# Patient Record
Sex: Female | Born: 1945 | Race: White | Hispanic: No | Marital: Single | Smoking: Never smoker
Health system: Southern US, Community
[De-identification: ages and names within clinical notes are randomized; demographics above are authoritative.]

## PROBLEM LIST (undated history)

## (undated) DIAGNOSIS — K5900 Constipation, unspecified: Secondary | ICD-10-CM

## (undated) DIAGNOSIS — M549 Dorsalgia, unspecified: Secondary | ICD-10-CM

## (undated) DIAGNOSIS — R600 Localized edema: Secondary | ICD-10-CM

## (undated) DIAGNOSIS — K829 Disease of gallbladder, unspecified: Secondary | ICD-10-CM

## (undated) DIAGNOSIS — J45909 Unspecified asthma, uncomplicated: Secondary | ICD-10-CM

## (undated) DIAGNOSIS — G4730 Sleep apnea, unspecified: Secondary | ICD-10-CM

## (undated) DIAGNOSIS — R7303 Prediabetes: Secondary | ICD-10-CM

## (undated) DIAGNOSIS — J449 Chronic obstructive pulmonary disease, unspecified: Secondary | ICD-10-CM

## (undated) DIAGNOSIS — R0600 Dyspnea, unspecified: Secondary | ICD-10-CM

## (undated) DIAGNOSIS — I10 Essential (primary) hypertension: Secondary | ICD-10-CM

## (undated) DIAGNOSIS — F419 Anxiety disorder, unspecified: Secondary | ICD-10-CM

## (undated) DIAGNOSIS — M2550 Pain in unspecified joint: Secondary | ICD-10-CM

## (undated) DIAGNOSIS — F32A Depression, unspecified: Secondary | ICD-10-CM

## (undated) DIAGNOSIS — F329 Major depressive disorder, single episode, unspecified: Secondary | ICD-10-CM

## (undated) DIAGNOSIS — E039 Hypothyroidism, unspecified: Secondary | ICD-10-CM

## (undated) DIAGNOSIS — E785 Hyperlipidemia, unspecified: Secondary | ICD-10-CM

## (undated) HISTORY — DX: Essential (primary) hypertension: I10

## (undated) HISTORY — PX: CHOLECYSTECTOMY: SHX55

## (undated) HISTORY — DX: Anxiety disorder, unspecified: F41.9

## (undated) HISTORY — DX: Hyperlipidemia, unspecified: E78.5

## (undated) HISTORY — DX: Localized edema: R60.0

## (undated) HISTORY — DX: Disease of gallbladder, unspecified: K82.9

## (undated) HISTORY — DX: Chronic obstructive pulmonary disease, unspecified: J44.9

## (undated) HISTORY — DX: Depression, unspecified: F32.A

## (undated) HISTORY — DX: Dorsalgia, unspecified: M54.9

## (undated) HISTORY — DX: Major depressive disorder, single episode, unspecified: F32.9

## (undated) HISTORY — DX: Dyspnea, unspecified: R06.00

## (undated) HISTORY — PX: LAPAROSCOPIC GASTRIC BANDING: SHX1100

## (undated) HISTORY — DX: Constipation, unspecified: K59.00

## (undated) HISTORY — DX: Hypothyroidism, unspecified: E03.9

## (undated) HISTORY — DX: Prediabetes: R73.03

## (undated) HISTORY — PX: ABDOMINAL HYSTERECTOMY: SHX81

## (undated) HISTORY — DX: Unspecified asthma, uncomplicated: J45.909

## (undated) HISTORY — PX: OTHER SURGICAL HISTORY: SHX169

## (undated) HISTORY — DX: Pain in unspecified joint: M25.50

---

## 2013-03-07 ENCOUNTER — Ambulatory Visit: Payer: Self-pay | Admitting: Adult Health

## 2014-12-31 DIAGNOSIS — R0602 Shortness of breath: Secondary | ICD-10-CM | POA: Diagnosis not present

## 2015-01-31 DIAGNOSIS — R0602 Shortness of breath: Secondary | ICD-10-CM | POA: Diagnosis not present

## 2015-02-26 DIAGNOSIS — I10 Essential (primary) hypertension: Secondary | ICD-10-CM | POA: Diagnosis not present

## 2015-02-26 DIAGNOSIS — E780 Pure hypercholesterolemia: Secondary | ICD-10-CM | POA: Diagnosis not present

## 2015-02-26 DIAGNOSIS — E039 Hypothyroidism, unspecified: Secondary | ICD-10-CM | POA: Diagnosis not present

## 2015-02-26 DIAGNOSIS — R7309 Other abnormal glucose: Secondary | ICD-10-CM | POA: Diagnosis not present

## 2015-03-01 DIAGNOSIS — R0602 Shortness of breath: Secondary | ICD-10-CM | POA: Diagnosis not present

## 2015-03-16 DIAGNOSIS — H5211 Myopia, right eye: Secondary | ICD-10-CM | POA: Diagnosis not present

## 2015-03-16 DIAGNOSIS — H5210 Myopia, unspecified eye: Secondary | ICD-10-CM | POA: Diagnosis not present

## 2015-03-16 DIAGNOSIS — H40003 Preglaucoma, unspecified, bilateral: Secondary | ICD-10-CM | POA: Diagnosis not present

## 2015-03-23 DIAGNOSIS — J4541 Moderate persistent asthma with (acute) exacerbation: Secondary | ICD-10-CM | POA: Diagnosis not present

## 2015-03-23 DIAGNOSIS — G4734 Idiopathic sleep related nonobstructive alveolar hypoventilation: Secondary | ICD-10-CM | POA: Diagnosis not present

## 2015-03-23 DIAGNOSIS — R0609 Other forms of dyspnea: Secondary | ICD-10-CM | POA: Diagnosis not present

## 2015-04-01 DIAGNOSIS — R0602 Shortness of breath: Secondary | ICD-10-CM | POA: Diagnosis not present

## 2015-05-01 DIAGNOSIS — R0602 Shortness of breath: Secondary | ICD-10-CM | POA: Diagnosis not present

## 2015-06-01 DIAGNOSIS — R0602 Shortness of breath: Secondary | ICD-10-CM | POA: Diagnosis not present

## 2015-07-01 DIAGNOSIS — R0602 Shortness of breath: Secondary | ICD-10-CM | POA: Diagnosis not present

## 2015-07-02 ENCOUNTER — Other Ambulatory Visit: Payer: Self-pay | Admitting: Family Medicine

## 2015-07-02 DIAGNOSIS — I10 Essential (primary) hypertension: Secondary | ICD-10-CM | POA: Diagnosis not present

## 2015-07-02 DIAGNOSIS — R7309 Other abnormal glucose: Secondary | ICD-10-CM | POA: Diagnosis not present

## 2015-07-02 DIAGNOSIS — E039 Hypothyroidism, unspecified: Secondary | ICD-10-CM | POA: Diagnosis not present

## 2015-07-02 DIAGNOSIS — Z0000 Encounter for general adult medical examination without abnormal findings: Secondary | ICD-10-CM | POA: Diagnosis not present

## 2015-07-02 DIAGNOSIS — E780 Pure hypercholesterolemia: Secondary | ICD-10-CM | POA: Diagnosis not present

## 2015-07-02 DIAGNOSIS — Z1231 Encounter for screening mammogram for malignant neoplasm of breast: Secondary | ICD-10-CM

## 2015-07-06 ENCOUNTER — Other Ambulatory Visit: Payer: Self-pay | Admitting: Family Medicine

## 2015-07-06 ENCOUNTER — Ambulatory Visit
Admission: RE | Admit: 2015-07-06 | Discharge: 2015-07-06 | Disposition: A | Discharge status: Home / Self Care | Payer: Commercial Managed Care - HMO | Source: Ambulatory Visit | Attending: Family Medicine | Admitting: Family Medicine

## 2015-07-06 DIAGNOSIS — Z1231 Encounter for screening mammogram for malignant neoplasm of breast: Secondary | ICD-10-CM | POA: Insufficient documentation

## 2015-07-08 DIAGNOSIS — H40003 Preglaucoma, unspecified, bilateral: Secondary | ICD-10-CM | POA: Diagnosis not present

## 2015-08-01 DIAGNOSIS — R0602 Shortness of breath: Secondary | ICD-10-CM | POA: Diagnosis not present

## 2015-08-27 DIAGNOSIS — B372 Candidiasis of skin and nail: Secondary | ICD-10-CM | POA: Diagnosis not present

## 2015-08-27 DIAGNOSIS — Z8709 Personal history of other diseases of the respiratory system: Secondary | ICD-10-CM | POA: Diagnosis not present

## 2015-08-27 DIAGNOSIS — B9689 Other specified bacterial agents as the cause of diseases classified elsewhere: Secondary | ICD-10-CM | POA: Diagnosis not present

## 2015-08-27 DIAGNOSIS — J208 Acute bronchitis due to other specified organisms: Secondary | ICD-10-CM | POA: Diagnosis not present

## 2015-09-01 DIAGNOSIS — R0602 Shortness of breath: Secondary | ICD-10-CM | POA: Diagnosis not present

## 2015-09-24 DIAGNOSIS — G4734 Idiopathic sleep related nonobstructive alveolar hypoventilation: Secondary | ICD-10-CM | POA: Diagnosis not present

## 2015-09-24 DIAGNOSIS — R0609 Other forms of dyspnea: Secondary | ICD-10-CM | POA: Diagnosis not present

## 2015-09-24 DIAGNOSIS — J310 Chronic rhinitis: Secondary | ICD-10-CM | POA: Diagnosis not present

## 2015-09-24 DIAGNOSIS — J449 Chronic obstructive pulmonary disease, unspecified: Secondary | ICD-10-CM | POA: Diagnosis not present

## 2015-10-01 DIAGNOSIS — R0602 Shortness of breath: Secondary | ICD-10-CM | POA: Diagnosis not present

## 2015-10-13 DIAGNOSIS — J441 Chronic obstructive pulmonary disease with (acute) exacerbation: Secondary | ICD-10-CM | POA: Diagnosis not present

## 2015-11-01 DIAGNOSIS — R0602 Shortness of breath: Secondary | ICD-10-CM | POA: Diagnosis not present

## 2015-11-03 DIAGNOSIS — E7800 Pure hypercholesterolemia, unspecified: Secondary | ICD-10-CM | POA: Diagnosis not present

## 2015-11-03 DIAGNOSIS — F331 Major depressive disorder, recurrent, moderate: Secondary | ICD-10-CM | POA: Diagnosis not present

## 2015-11-03 DIAGNOSIS — E039 Hypothyroidism, unspecified: Secondary | ICD-10-CM | POA: Diagnosis not present

## 2015-11-03 DIAGNOSIS — I10 Essential (primary) hypertension: Secondary | ICD-10-CM | POA: Diagnosis not present

## 2015-11-03 DIAGNOSIS — R7303 Prediabetes: Secondary | ICD-10-CM | POA: Diagnosis not present

## 2015-12-01 DIAGNOSIS — R0602 Shortness of breath: Secondary | ICD-10-CM | POA: Diagnosis not present

## 2016-01-01 DIAGNOSIS — R0602 Shortness of breath: Secondary | ICD-10-CM | POA: Diagnosis not present

## 2016-02-01 DIAGNOSIS — R0602 Shortness of breath: Secondary | ICD-10-CM | POA: Diagnosis not present

## 2016-02-29 DIAGNOSIS — R0602 Shortness of breath: Secondary | ICD-10-CM | POA: Diagnosis not present

## 2016-03-01 DIAGNOSIS — E039 Hypothyroidism, unspecified: Secondary | ICD-10-CM | POA: Diagnosis not present

## 2016-03-01 DIAGNOSIS — I10 Essential (primary) hypertension: Secondary | ICD-10-CM | POA: Diagnosis not present

## 2016-03-01 DIAGNOSIS — E7800 Pure hypercholesterolemia, unspecified: Secondary | ICD-10-CM | POA: Diagnosis not present

## 2016-03-01 DIAGNOSIS — R7303 Prediabetes: Secondary | ICD-10-CM | POA: Diagnosis not present

## 2016-03-01 DIAGNOSIS — R7309 Other abnormal glucose: Secondary | ICD-10-CM | POA: Diagnosis not present

## 2016-03-10 DIAGNOSIS — E876 Hypokalemia: Secondary | ICD-10-CM | POA: Diagnosis not present

## 2016-03-10 DIAGNOSIS — N289 Disorder of kidney and ureter, unspecified: Secondary | ICD-10-CM | POA: Diagnosis not present

## 2016-03-10 DIAGNOSIS — R0609 Other forms of dyspnea: Secondary | ICD-10-CM | POA: Diagnosis not present

## 2016-03-12 DIAGNOSIS — J45909 Unspecified asthma, uncomplicated: Secondary | ICD-10-CM | POA: Diagnosis not present

## 2016-03-17 DIAGNOSIS — J310 Chronic rhinitis: Secondary | ICD-10-CM | POA: Diagnosis not present

## 2016-03-17 DIAGNOSIS — J45909 Unspecified asthma, uncomplicated: Secondary | ICD-10-CM | POA: Diagnosis not present

## 2016-03-17 DIAGNOSIS — J9801 Acute bronchospasm: Secondary | ICD-10-CM | POA: Diagnosis not present

## 2016-03-28 DIAGNOSIS — J45909 Unspecified asthma, uncomplicated: Secondary | ICD-10-CM | POA: Diagnosis not present

## 2016-03-28 DIAGNOSIS — R05 Cough: Secondary | ICD-10-CM | POA: Diagnosis not present

## 2016-03-31 DIAGNOSIS — R0602 Shortness of breath: Secondary | ICD-10-CM | POA: Diagnosis not present

## 2016-04-04 DIAGNOSIS — I10 Essential (primary) hypertension: Secondary | ICD-10-CM | POA: Diagnosis not present

## 2016-04-27 DIAGNOSIS — E039 Hypothyroidism, unspecified: Secondary | ICD-10-CM | POA: Diagnosis not present

## 2016-04-30 DIAGNOSIS — R0602 Shortness of breath: Secondary | ICD-10-CM | POA: Diagnosis not present

## 2016-05-31 DIAGNOSIS — R0602 Shortness of breath: Secondary | ICD-10-CM | POA: Diagnosis not present

## 2016-06-30 DIAGNOSIS — R0602 Shortness of breath: Secondary | ICD-10-CM | POA: Diagnosis not present

## 2016-07-31 DIAGNOSIS — R0602 Shortness of breath: Secondary | ICD-10-CM | POA: Diagnosis not present

## 2016-08-24 DIAGNOSIS — E7800 Pure hypercholesterolemia, unspecified: Secondary | ICD-10-CM | POA: Diagnosis not present

## 2016-08-24 DIAGNOSIS — R001 Bradycardia, unspecified: Secondary | ICD-10-CM | POA: Diagnosis not present

## 2016-08-24 DIAGNOSIS — I10 Essential (primary) hypertension: Secondary | ICD-10-CM | POA: Diagnosis not present

## 2016-08-24 DIAGNOSIS — R7303 Prediabetes: Secondary | ICD-10-CM | POA: Diagnosis not present

## 2016-08-24 DIAGNOSIS — F3340 Major depressive disorder, recurrent, in remission, unspecified: Secondary | ICD-10-CM | POA: Diagnosis not present

## 2016-08-24 DIAGNOSIS — E039 Hypothyroidism, unspecified: Secondary | ICD-10-CM | POA: Diagnosis not present

## 2016-08-31 DIAGNOSIS — R0602 Shortness of breath: Secondary | ICD-10-CM | POA: Diagnosis not present

## 2016-09-01 DIAGNOSIS — N179 Acute kidney failure, unspecified: Secondary | ICD-10-CM | POA: Diagnosis not present

## 2016-09-01 DIAGNOSIS — E876 Hypokalemia: Secondary | ICD-10-CM | POA: Diagnosis not present

## 2016-09-12 DIAGNOSIS — Z79899 Other long term (current) drug therapy: Secondary | ICD-10-CM | POA: Diagnosis not present

## 2016-09-30 DIAGNOSIS — R0602 Shortness of breath: Secondary | ICD-10-CM | POA: Diagnosis not present

## 2016-10-20 DIAGNOSIS — Z23 Encounter for immunization: Secondary | ICD-10-CM | POA: Diagnosis not present

## 2016-10-31 DIAGNOSIS — R0602 Shortness of breath: Secondary | ICD-10-CM | POA: Diagnosis not present

## 2016-11-30 DIAGNOSIS — R0602 Shortness of breath: Secondary | ICD-10-CM | POA: Diagnosis not present

## 2016-12-31 DIAGNOSIS — R0602 Shortness of breath: Secondary | ICD-10-CM | POA: Diagnosis not present

## 2017-01-31 DIAGNOSIS — R0602 Shortness of breath: Secondary | ICD-10-CM | POA: Diagnosis not present

## 2017-02-02 DIAGNOSIS — F3340 Major depressive disorder, recurrent, in remission, unspecified: Secondary | ICD-10-CM | POA: Diagnosis not present

## 2017-02-02 DIAGNOSIS — E1129 Type 2 diabetes mellitus with other diabetic kidney complication: Secondary | ICD-10-CM | POA: Diagnosis not present

## 2017-02-02 DIAGNOSIS — Z780 Asymptomatic menopausal state: Secondary | ICD-10-CM | POA: Diagnosis not present

## 2017-02-02 DIAGNOSIS — E039 Hypothyroidism, unspecified: Secondary | ICD-10-CM | POA: Diagnosis not present

## 2017-02-02 DIAGNOSIS — Z23 Encounter for immunization: Secondary | ICD-10-CM | POA: Diagnosis not present

## 2017-02-02 DIAGNOSIS — E7800 Pure hypercholesterolemia, unspecified: Secondary | ICD-10-CM | POA: Diagnosis not present

## 2017-02-02 DIAGNOSIS — I10 Essential (primary) hypertension: Secondary | ICD-10-CM | POA: Diagnosis not present

## 2017-02-02 DIAGNOSIS — Z0000 Encounter for general adult medical examination without abnormal findings: Secondary | ICD-10-CM | POA: Diagnosis not present

## 2017-02-03 ENCOUNTER — Other Ambulatory Visit: Payer: Self-pay | Admitting: Family Medicine

## 2017-02-03 DIAGNOSIS — Z1231 Encounter for screening mammogram for malignant neoplasm of breast: Secondary | ICD-10-CM

## 2017-02-06 ENCOUNTER — Ambulatory Visit
Admission: RE | Admit: 2017-02-06 | Discharge: 2017-02-06 | Disposition: A | Discharge status: Home / Self Care | Payer: Commercial Managed Care - HMO | Source: Ambulatory Visit | Attending: Family Medicine | Admitting: Family Medicine

## 2017-02-06 DIAGNOSIS — Z1231 Encounter for screening mammogram for malignant neoplasm of breast: Secondary | ICD-10-CM | POA: Diagnosis not present

## 2017-02-15 DIAGNOSIS — H524 Presbyopia: Secondary | ICD-10-CM | POA: Diagnosis not present

## 2017-02-20 DIAGNOSIS — M8588 Other specified disorders of bone density and structure, other site: Secondary | ICD-10-CM | POA: Diagnosis not present

## 2017-02-20 DIAGNOSIS — Z780 Asymptomatic menopausal state: Secondary | ICD-10-CM | POA: Diagnosis not present

## 2017-02-21 DIAGNOSIS — R938 Abnormal findings on diagnostic imaging of other specified body structures: Secondary | ICD-10-CM | POA: Diagnosis not present

## 2017-02-22 DIAGNOSIS — M47816 Spondylosis without myelopathy or radiculopathy, lumbar region: Secondary | ICD-10-CM | POA: Diagnosis not present

## 2017-02-28 DIAGNOSIS — R0602 Shortness of breath: Secondary | ICD-10-CM | POA: Diagnosis not present

## 2017-03-31 DIAGNOSIS — R0602 Shortness of breath: Secondary | ICD-10-CM | POA: Diagnosis not present

## 2017-03-31 DIAGNOSIS — E039 Hypothyroidism, unspecified: Secondary | ICD-10-CM | POA: Diagnosis not present

## 2017-04-27 ENCOUNTER — Encounter (INDEPENDENT_AMBULATORY_CARE_PROVIDER_SITE_OTHER): Payer: Self-pay | Admitting: Family Medicine

## 2017-04-30 DIAGNOSIS — R0602 Shortness of breath: Secondary | ICD-10-CM | POA: Diagnosis not present

## 2017-05-03 DIAGNOSIS — Z0289 Encounter for other administrative examinations: Secondary | ICD-10-CM

## 2017-05-30 DIAGNOSIS — E1129 Type 2 diabetes mellitus with other diabetic kidney complication: Secondary | ICD-10-CM | POA: Diagnosis not present

## 2017-05-30 DIAGNOSIS — F419 Anxiety disorder, unspecified: Secondary | ICD-10-CM | POA: Diagnosis not present

## 2017-05-30 DIAGNOSIS — E039 Hypothyroidism, unspecified: Secondary | ICD-10-CM | POA: Diagnosis not present

## 2017-05-30 DIAGNOSIS — I10 Essential (primary) hypertension: Secondary | ICD-10-CM | POA: Diagnosis not present

## 2017-05-30 DIAGNOSIS — E7800 Pure hypercholesterolemia, unspecified: Secondary | ICD-10-CM | POA: Diagnosis not present

## 2017-05-30 DIAGNOSIS — F3340 Major depressive disorder, recurrent, in remission, unspecified: Secondary | ICD-10-CM | POA: Diagnosis not present

## 2017-05-31 ENCOUNTER — Encounter (INDEPENDENT_AMBULATORY_CARE_PROVIDER_SITE_OTHER): Payer: Self-pay | Admitting: Family Medicine

## 2017-05-31 ENCOUNTER — Ambulatory Visit (INDEPENDENT_AMBULATORY_CARE_PROVIDER_SITE_OTHER): Payer: Commercial Managed Care - HMO | Admitting: Family Medicine

## 2017-05-31 VITALS — BP 148/76 | HR 119 | Temp 98.3°F | Ht 59.0 in | Wt 217.0 lb

## 2017-05-31 DIAGNOSIS — R0602 Shortness of breath: Secondary | ICD-10-CM

## 2017-05-31 DIAGNOSIS — R7303 Prediabetes: Secondary | ICD-10-CM | POA: Diagnosis not present

## 2017-05-31 DIAGNOSIS — Z1389 Encounter for screening for other disorder: Secondary | ICD-10-CM

## 2017-05-31 DIAGNOSIS — R5383 Other fatigue: Secondary | ICD-10-CM | POA: Diagnosis not present

## 2017-05-31 DIAGNOSIS — E559 Vitamin D deficiency, unspecified: Secondary | ICD-10-CM

## 2017-05-31 DIAGNOSIS — E038 Other specified hypothyroidism: Secondary | ICD-10-CM

## 2017-05-31 DIAGNOSIS — Z6841 Body Mass Index (BMI) 40.0 and over, adult: Secondary | ICD-10-CM | POA: Diagnosis not present

## 2017-05-31 DIAGNOSIS — I10 Essential (primary) hypertension: Secondary | ICD-10-CM | POA: Diagnosis not present

## 2017-05-31 DIAGNOSIS — E7849 Other hyperlipidemia: Secondary | ICD-10-CM

## 2017-05-31 DIAGNOSIS — Z1331 Encounter for screening for depression: Secondary | ICD-10-CM

## 2017-05-31 DIAGNOSIS — E784 Other hyperlipidemia: Secondary | ICD-10-CM

## 2017-05-31 NOTE — Progress Notes (Signed)
Office: [phone]  /  Fax: [phone]   HPI:   Chief Complaint: OBESITY  Jenny Thomas (MR# [id number]) is a 71 y.o. female who presents on 05/31/2017 for obesity evaluation and treatment. Current BMI is Body mass index is 43.83 kg/m.Jenny Thomas has struggled with obesity for years and has been unsuccessful in either losing weight or maintaining long term weight loss. Jenny Thomas attended our information session and states she is currently in the action stage of change and ready to dedicate time achieving and maintaining a healthier weight.  Jenny Thomas states her family eats meals together she thinks her family will eat healthier with  her her desired weight loss is 46 lbs she started gaining weight after giving birth her heaviest weight ever was 240 lbs. she has significant food cravings issues  she snacks frequently in the evenings she wakes up frquently in the middle of the night to eat she skips meals frequently she is frequently drinking liquids with calories she frequently makes poor food choices she frequently eats larger portions than normal  she has binge eating behaviors she struggles with emotional eating    Fatigue Frimy feels her energy is lower than it should be. This has worsened with weight gain and has not worsened recently. Jenny Thomas admits to daytime somnolence and  admits to waking up still tired. Patient is at risk for obstructive sleep apnea. Patent has a history of symptoms of daytime fatigue, morning headache and hypertension. Patient generally gets 6 or 7 hours of sleep per night, and states they generally have restless sleep. Snoring is present. Apneic episodes are not present. Epworth Sleepiness Score is 10  Dyspnea on exertion [name] notes increasing shortness of breath with exercising and seems to be worsening over time with weight gain. She notes getting out of breath sooner with activity than she used to. This has not gotten worse recently. Jenny Thomas denies  orthopnea.  Vitamin D deficiency Jenny Thomas has a diagnosis of vitamin D deficiency. She is currently taking OTC calcium/vit D. She admits fatigue and denies nausea, vomiting or muscle weakness.  Hypothyroid Jenny Thomas has a diagnosis of hypothyroidism. She is on levothyroxine 88 mg. She admits fatigue and denies hot or cold intolerance or palpitations, but does admit to ongoing fatigue.  Hypertension Jenny Thomas is a 71 y.o. female with hypertension. Her blood pressure is elevated today, on medications, may be white coat syndrome.  Jenny Thomas denies chest pain. She is working weight loss to help control her blood pressure with the goal of decreasing her risk of heart attack and stroke. Corrines blood pressure is not currently controlled.  Pre-Diabetes Jenny Thomas has been told she has a diagnosis of pre-diabetes but is uncertain of lab results. She notes some hypoglycemia if she skips meals and also notes polyphagia. She was informed this puts her at greater risk of developing diabetes. She is not taking metformin currently and continues to work on diet and exercise to decrease risk of diabetes. She denies nausea.  Hyperlipidemia Jenny Thomas has hyperlipidemia and is currently on simvastatin. She is attempting to improve her cholesterol levels with intensive lifestyle modification including a low saturated fat diet, exercise and weight loss. She denies any chest pain, claudication or myalgias.   Depression Screen Jenny Thomas's Food and Mood (modified PHQ-9) score was  Depression screen PHQ 2/9 05/31/2017  Decreased Interest 1  Down, Depressed, Hopeless 3  PHQ - 2 Score 4  Altered sleeping 3  Tired, decreased energy 3  Change in appetite 3  Feeling bad or failure about yourself  2  Trouble concentrating 0  Moving slowly or fidgety/restless 0  Suicidal thoughts 0  PHQ-9 Score 15    ALLERGIES: Allergies  Allergen Reactions  . Percocet [Oxycodone-Acetaminophen]     MEDICATIONS: No  current outpatient prescriptions on file prior to visit.   No current facility-administered medications on file prior to visit.     PAST MEDICAL HISTORY: Past Medical History:  Diagnosis Date  . Anxiety   . Asthma   . Back pain   . Constipation   . COPD (chronic obstructive pulmonary disease) ([place])   . Depression   . Dyspnea   . Gallbladder problem   . Hyperlipidemia   . Hypertension   . Hypothyroid   . Joint pain   . Leg edema   . Prediabetes     PAST SURGICAL HISTORY: Past Surgical History:  Procedure Laterality Date  . ABDOMINAL HYSTERECTOMY    . CHOLECYSTECTOMY    . hernia repair    . LAPAROSCOPIC GASTRIC BANDING      SOCIAL HISTORY: Social History  Substance Use Topics  . Smoking status: Never Smoker  . Smokeless tobacco: Never Used  . Alcohol use Not on file    FAMILY HISTORY: Family History  Problem Relation Age of Onset  . Breast cancer Maternal Aunt   . Hypertension Mother   . Kidney disease Mother   . Obesity Mother   . Stroke Father   . Heart disease Father   . Alcoholism Father     ROS: Review of Systems  Constitutional: Positive for malaise/fatigue.  HENT: Positive for hearing loss and sinus pain.   Eyes: Positive for blurred vision.       Vision Changes Floaters   Respiratory: Positive for cough, shortness of breath (on exertion) and wheezing.   Cardiovascular: Negative for chest pain, palpitations, orthopnea and claudication.  Gastrointestinal: Negative for nausea and vomiting.  Musculoskeletal: Negative for myalgias.       Negative muscle weakness  Neurological: Positive for headaches.  Endo/Heme/Allergies:       Polyphagia hypoglycemia Negative Heat/Cold Intolerance  Psychiatric/Behavioral: Positive for depression.    PHYSICAL EXAM: Blood pressure (!) 148/76, pulse (!) 119, temperature 98.3 F (36.8 C), temperature source Oral, height 4\' 11"  (1.499 m), weight 217 lb (98.4 kg), SpO2 95 %. Body mass index is 43.83  kg/m. Physical Exam  Constitutional: She is oriented to person, place, and time. She appears well-developed and well-nourished.  Cardiovascular: Tachycardia present.   Pulmonary/Chest: Effort normal.  Musculoskeletal: Normal range of motion.  Minimal effusion on right knee Varicose veins on bilateral lower extremities Acrochordon on cervical neck Multiple hyperpigmented acrochordon on neck  Neurological: She is oriented to person, place, and time.  Skin: Skin is warm and dry.  Vitals reviewed.   RECENT LABS AND TESTS: BMET No results found for: NA, K, CL, CO2, GLUCOSE, BUN, CREATININE, CALCIUM, GFRNONAA, GFRAA No results found for: HGBA1C No results found for: INSULIN CBC No results found for: WBC, RBC, HGB, HCT, PLT, MCV, MCH, MCHC, RDW, LYMPHSABS, MONOABS, EOSABS, BASOSABS Iron/TIBC/Ferritin/ %Sat No results found for: IRON, TIBC, FERRITIN, IRONPCTSAT Lipid Panel  No results found for: CHOL, TRIG, HDL, CHOLHDL, VLDL, LDLCALC, LDLDIRECT Hepatic Function Panel  No results found for: PROT, ALBUMIN, AST, ALT, ALKPHOS, BILITOT, BILIDIR, IBILI No results found for: TSH  ECG  shows NSR with a rate of 85 BPM INDIRECT CALORIMETER done today shows a VO2 of 192 and a REE of 1333.  ASSESSMENT AND PLAN: Other fatigue - Plan: EKG 12-Lead, CBC With Differential  Shortness of breath on exertion  Prediabetes - Plan: Comprehensive metabolic panel, Hemoglobin A1c, Insulin, random  Vitamin D deficiency - Plan: VITAMIN D 25 Hydroxy (Vit-D Deficiency, Fractures)  Essential hypertension  Other hyperlipidemia - Plan: Lipid Panel With LDL/HDL Ratio  Other specified hypothyroidism - Plan: T3, T4, free, TSH  Depression screening  Morbid obesity ([place])  PLAN:  Fatigue Julitza was informed that her fatigue may be related to obesity, depression or many other causes. Labs will be ordered, and in the meanwhile Deedra has agreed to work on diet, exercise and weight loss to help with  fatigue. Proper sleep hygiene was discussed including the need for 7-8 hours of quality sleep each night. A sleep study was not ordered based on symptoms and Epworth score.  Dyspnea on exertion Mikeila's shortness of breath appears to be obesity related and exercise induced. She has agreed to work on weight loss and gradually increase exercise to treat her exercise induced shortness of breath. If Ahava follows our instructions and loses weight without improvement of her shortness of breath, we will plan to refer to pulmonology. We will monitor this condition regularly. Myrka agrees to this plan.  Vitamin D Deficiency Azia was informed that low vitamin D levels contributes to fatigue and are associated with obesity, breast, and colon cancer. She agrees to continue to take OTC Vit D and we will check labs and will follow up for routine testing of vitamin D, at least 2-3 times per year. She was informed of the risk of over-replacement of vitamin D and agrees to not increase her dose unless he discusses this with [name] first. Arrayah agrees to follow up with our clinic in 2 to 3 weeks.  Hypothyroid Aidel was informed of the importance of good thyroid control to help with weight loss efforts. She was also informed that supertheraputic thyroid levels are dangerous and will not improve weight loss results.   Hypertension We discussed sodium restriction, working on healthy weight loss, and a regular exercise program as the means to achieve improved blood pressure control. Layla agreed with this plan and agreed to follow up as directed. We will check labs and will continue to monitor her blood pressure as well as her progress with the above lifestyle modifications. She will continue her medications as prescribed and will watch for signs of hypotension as she continues her lifestyle modifications.  Pre-Diabetes Aelyn will continue to work on weight loss, exercise, and decreasing simple carbohydrates  in her diet to help decrease the risk of diabetes. She was informed that eating too many simple carbohydrates or too many calories at one sitting increases the likelihood of GI side effects. We will check labs and Caresse agreed to follow up with [name] as directed to monitor her progress.  Hyperlipidemia Lotoya was informed of the American Heart Association Guidelines emphasizing intensive lifestyle modifications as the first line treatment for hyperlipidemia. We discussed many lifestyle modifications today in depth, and Brionne will continue to work on decreasing saturated fats such as fatty red meat, butter and many fried foods. She will also increase vegetables and lean protein in her diet and continue to work on exercise and weight loss efforts. We will check labs and Navpreet agrees to follow up as directed.  Depression Screen Imanii had a strongly positive depression screening. Depression is commonly associated with obesity and often results in emotional eating behaviors. We will monitor this closely and work  on CBT to help improve the non-hunger eating patterns. Referral to Psychology may be required if no improvement is seen as she continues in our clinic.  Obesity Lunette is currently in the action stage of change and her goal is to continue with weight loss efforts She has agreed to follow the Category 1 plan +100 calories Marguarite has been instructed to work up to a goal of 150 minutes of combined cardio and strengthening exercise per week for weight loss and overall health benefits. We discussed the following Behavioral Modification Strategies today: no skipping meals, meal planning & cooking strategies, increasing lean protein intake and decrease liquid calories  Theona has agreed to follow up with our clinic in 2 to 3 weeks. She was informed of the importance of frequent follow up visits to maximize her success with intensive lifestyle modifications for her multiple health conditions. She  was informed we would discuss her lab results at her next visit unless there is a critical issue that needs to be addressed sooner. Sameen agreed to keep her next visit at the agreed upon time to discuss these results.  I, Doreene Nest, am acting as transcriptionist for Dennard Nip, MD  I have reviewed the above documentation for accuracy and completeness, and I agree with the above. -Dennard Nip, MD  OBESITY BEHAVIORAL INTERVENTION VISIT  Today's visit was # 1 out of 98.  Starting weight: 217 lbs Starting date: 05/31/17 Today's weight : 217 lbs  Today's date: 05/31/2017 Total lbs lost to date: 0 (Patients must lose 7 lbs in the first 6 months to continue with counseling)   ASK: We discussed the diagnosis of obesity with Brigit F Luth today and Georgene agreed to give [name] permission to discuss obesity behavioral modification therapy today.  ASSESS: Mikhia has the diagnosis of obesity and her BMI today is 43.9 Graceann is in the action stage of change   ADVISE: Kayley was educated on the multiple health risks of obesity as well as the benefit of weight loss to improve her health. She was advised of the need for long term treatment and the importance of lifestyle modifications.  AGREE: Multiple dietary modification options and treatment options were discussed and  Cheronda agreed to follow the Category 1 plan +100 calories We discussed the following Behavioral Modification Strategies today: no skipping meals, meal planning & cooking strategies, increasing lean protein intake and decrease liquid calories

## 2017-06-01 LAB — COMPREHENSIVE METABOLIC PANEL
ALBUMIN: 4.2 g/dL (ref 3.6–4.8)
ALK PHOS: 106 IU/L (ref 39–117)
ALT: 16 IU/L (ref 0–32)
AST: 26 IU/L (ref 0–40)
Albumin/Globulin Ratio: 1.8 (ref 1.2–2.2)
BUN / CREAT RATIO: 11 — AB (ref 12–28)
BUN: 13 mg/dL (ref 8–27)
Bilirubin Total: 0.6 mg/dL (ref 0.0–1.2)
CO2: 24 mmol/L (ref 20–29)
CREATININE: 1.16 mg/dL — AB (ref 0.57–1.00)
Calcium: 9.1 mg/dL (ref 8.7–10.3)
Chloride: 101 mmol/L (ref 96–106)
GFR calc Af Amer: 56 mL/min/{1.73_m2} — ABNORMAL LOW (ref >59)
GFR calc non Af Amer: 48 mL/min/{1.73_m2} — ABNORMAL LOW (ref >59)
GLOBULIN, TOTAL: 2.3 g/dL (ref 1.5–4.5)
Glucose: 90 mg/dL (ref 65–99)
POTASSIUM: 4.1 mmol/L (ref 3.5–5.2)
Sodium: 141 mmol/L (ref 134–144)
Total Protein: 6.5 g/dL (ref 6.0–8.5)

## 2017-06-01 LAB — CBC WITH DIFFERENTIAL
BASOS: 0 %
Basophils Absolute: 0 10*3/uL (ref 0.0–0.2)
EOS (ABSOLUTE): 0.1 10*3/uL (ref 0.0–0.4)
EOS: 1 %
HEMATOCRIT: 41 % (ref 34.0–46.6)
HEMOGLOBIN: 13.9 g/dL (ref 11.1–15.9)
IMMATURE GRANULOCYTES: 0 %
Immature Grans (Abs): 0 10*3/uL (ref 0.0–0.1)
LYMPHS ABS: 1.8 10*3/uL (ref 0.7–3.1)
Lymphs: 34 %
MCH: 30.3 pg (ref 26.6–33.0)
MCHC: 33.9 g/dL (ref 31.5–35.7)
MCV: 90 fL (ref 79–97)
MONOCYTES: 8 %
MONOS ABS: 0.4 10*3/uL (ref 0.1–0.9)
NEUTROS ABS: 3.1 10*3/uL (ref 1.4–7.0)
Neutrophils: 57 %
RBC: 4.58 x10E6/uL (ref 3.77–5.28)
RDW: 15 % (ref 12.3–15.4)
WBC: 5.4 10*3/uL (ref 3.4–10.8)

## 2017-06-01 LAB — TSH: TSH: 1.11 u[IU]/mL (ref 0.450–4.500)

## 2017-06-01 LAB — HEMOGLOBIN A1C
ESTIMATED AVERAGE GLUCOSE: 117 mg/dL
Hgb A1c MFr Bld: 5.7 % — ABNORMAL HIGH (ref 4.8–5.6)

## 2017-06-01 LAB — T4, FREE: Free T4: 1.33 ng/dL (ref 0.82–1.77)

## 2017-06-01 LAB — LIPID PANEL WITH LDL/HDL RATIO
Cholesterol, Total: 168 mg/dL (ref 100–199)
HDL: 59 mg/dL (ref >39)
LDL CALC: 80 mg/dL (ref 0–99)
LDL/HDL RATIO: 1.4 ratio (ref 0.0–3.2)
TRIGLYCERIDES: 144 mg/dL (ref 0–149)
VLDL Cholesterol Cal: 29 mg/dL (ref 5–40)

## 2017-06-01 LAB — INSULIN, RANDOM: INSULIN: 19.4 u[IU]/mL (ref 2.6–24.9)

## 2017-06-01 LAB — T3: T3 TOTAL: 85 ng/dL (ref 71–180)

## 2017-06-01 LAB — VITAMIN D 25 HYDROXY (VIT D DEFICIENCY, FRACTURES): Vit D, 25-Hydroxy: 31.5 ng/mL (ref 30.0–100.0)

## 2017-06-12 ENCOUNTER — Ambulatory Visit (INDEPENDENT_AMBULATORY_CARE_PROVIDER_SITE_OTHER): Payer: Commercial Managed Care - HMO | Admitting: Family Medicine

## 2017-06-12 VITALS — BP 111/75 | HR 122 | Temp 97.9°F | Ht 59.0 in | Wt 210.0 lb

## 2017-06-12 DIAGNOSIS — Z9189 Other specified personal risk factors, not elsewhere classified: Secondary | ICD-10-CM

## 2017-06-12 DIAGNOSIS — E669 Obesity, unspecified: Secondary | ICD-10-CM | POA: Diagnosis not present

## 2017-06-12 DIAGNOSIS — R7303 Prediabetes: Secondary | ICD-10-CM

## 2017-06-12 DIAGNOSIS — Z6841 Body Mass Index (BMI) 40.0 and over, adult: Secondary | ICD-10-CM | POA: Diagnosis not present

## 2017-06-12 DIAGNOSIS — E559 Vitamin D deficiency, unspecified: Secondary | ICD-10-CM

## 2017-06-12 DIAGNOSIS — IMO0001 Reserved for inherently not codable concepts without codable children: Secondary | ICD-10-CM

## 2017-06-12 MED ORDER — VITAMIN D (ERGOCALCIFEROL) 1.25 MG (50000 UNIT) PO CAPS: 50000.0000 [IU] | ORAL_CAPSULE | ORAL | 0 refills | Status: DC

## 2017-06-12 MED ORDER — METFORMIN HCL 500 MG PO TABS: 500.0000 mg | ORAL_TABLET | Freq: Every day | ORAL | 0 refills | Status: DC

## 2017-06-12 NOTE — Progress Notes (Signed)
Office: [phone]  /  Fax: [phone]   HPI:   Chief Complaint: OBESITY Jenny Thomas is here to discuss her progress with her obesity treatment plan. She is on the  follow the Category 1 plan and is following her eating plan approximately 90 % of the time. She states she is exercising 0 minutes 0 times per week. Jalaysia did well with weight loss on category 1 plan. She did better spreading the food out into multiple small meals. Hunger was well controlled. Her weight is 210 lb (95.3 kg) today and has had a weight loss of 7 pounds over a period of 2 weeks since her last visit. She has lost 7 lbs since starting treatment with [name].  Vitamin D deficiency Jenny Thomas has a diagnosis of vitamin D deficiency. She is currently taking OTC calcium + vit D but not yet at goal. She is on Fosamax weekly. She denies nausea, vomiting or muscle weakness.  Pre-Diabetes Jenny Thomas has a new diagnosis of pre-diabetes based on her elevated Hgb A1c at 5.7 and was informed this puts her at greater risk of developing diabetes. She is not taking metformin currently and continues to work on diet and exercise to decrease risk of diabetes. Jenny Thomas is doing well on diet. She admits polyphagia and she denies nausea or hypoglycemia.    ALLERGIES: Allergies  Allergen Reactions  . Percocet [Oxycodone-Acetaminophen]     MEDICATIONS: Current Outpatient Prescriptions on File Prior to Visit  Medication Sig Dispense Refill  . albuterol (ACCUNEB) 0.63 MG/3ML nebulizer solution Take 1 ampule by nebulization every 6 (six) hours as needed for wheezing.    [name] albuterol (PROVENTIL HFA;VENTOLIN HFA) 108 (90 Base) MCG/ACT inhaler Inhale into the lungs every 6 (six) hours as needed for wheezing or shortness of breath.    [name] alendronate (FOSAMAX) 70 MG tablet Take 70 mg by mouth once a week. Take with a full glass of water on an empty stomach.    . Calcium Carb-Cholecalciferol (CALCIUM/VITAMIN D) 500-200 MG-UNIT TABS Take by mouth daily.  Take one tablet by mouth once a day    . citalopram (CELEXA) 40 MG tablet Take 40 mg by mouth daily.    . Fluticasone-Salmeterol (ADVAIR) 250-50 MCG/DOSE AEPB Inhale 1 puff into the lungs 2 (two) times daily.    . furosemide (LASIX) 20 MG tablet Take 20 mg by mouth daily as needed.    [name] levothyroxine (SYNTHROID, LEVOTHROID) 88 MCG tablet Take 88 mcg by mouth daily before breakfast.    . losartan (COZAAR) 50 MG tablet Take 50 mg by mouth daily.    . montelukast (SINGULAIR) 10 MG tablet Take 10 mg by mouth at bedtime.    . potassium chloride SA (K-DUR,KLOR-CON) 20 MEQ tablet Take 20 mEq by mouth once.    . simvastatin (ZOCOR) 20 MG tablet Take 20 mg by mouth daily.     No current facility-administered medications on file prior to visit.     PAST MEDICAL HISTORY: Past Medical History:  Diagnosis Date  . Anxiety   . Asthma   . Back pain   . Constipation   . COPD (chronic obstructive pulmonary disease) ([place])   . Depression   . Dyspnea   . Gallbladder problem   . Hyperlipidemia   . Hypertension   . Hypothyroid   . Joint pain   . Leg edema   . Prediabetes     PAST SURGICAL HISTORY: Past Surgical History:  Procedure Laterality Date  . ABDOMINAL HYSTERECTOMY    .  CHOLECYSTECTOMY    . hernia repair    . LAPAROSCOPIC GASTRIC BANDING      SOCIAL HISTORY: Social History  Substance Use Topics  . Smoking status: Never Smoker  . Smokeless tobacco: Never Used  . Alcohol use Not on file    FAMILY HISTORY: Family History  Problem Relation Age of Onset  . Breast cancer Maternal Aunt   . Hypertension Mother   . Kidney disease Mother   . Obesity Mother   . Stroke Father   . Heart disease Father   . Alcoholism Father     ROS: Review of Systems  Constitutional: Positive for weight loss.  Gastrointestinal: Negative for nausea and vomiting.  Musculoskeletal:       Negative muscle weakness  Endo/Heme/Allergies:       Polyphagia Negative hypoglycemia    PHYSICAL  EXAM: Blood pressure 111/75, pulse (!) 122, temperature 97.9 F (36.6 C), temperature source Oral, height 4\' 11"  (1.499 m), weight 210 lb (95.3 kg), SpO2 95 %. Body mass index is 42.41 kg/m. Physical Exam  Constitutional: She is oriented to person, place, and time. She appears well-developed and well-nourished.  Cardiovascular: Normal rate.   Pulmonary/Chest: Effort normal.  Musculoskeletal: Normal range of motion.  Neurological: She is oriented to person, place, and time.  Skin: Skin is warm and dry.  Psychiatric: She has a normal mood and affect. Her behavior is normal.  Vitals reviewed.   RECENT LABS AND TESTS: BMET    Component Value Date/Time   NA 141 05/31/2017 1158   K 4.1 05/31/2017 1158   CL 101 05/31/2017 1158   CO2 24 05/31/2017 1158   GLUCOSE 90 05/31/2017 1158   BUN 13 05/31/2017 1158   CREATININE 1.16 (H) 05/31/2017 1158   CALCIUM 9.1 05/31/2017 1158   GFRNONAA 48 (L) 05/31/2017 1158   GFRAA 56 (L) 05/31/2017 1158   Lab Results  Component Value Date   HGBA1C 5.7 (H) 05/31/2017   Lab Results  Component Value Date   INSULIN 19.4 05/31/2017   CBC    Component Value Date/Time   WBC 5.4 05/31/2017 1158   RBC 4.58 05/31/2017 1158   HGB 13.9 05/31/2017 1158   HCT 41.0 05/31/2017 1158   MCV 90 05/31/2017 1158   MCH 30.3 05/31/2017 1158   MCHC 33.9 05/31/2017 1158   RDW 15.0 05/31/2017 1158   LYMPHSABS 1.8 05/31/2017 1158   EOSABS 0.1 05/31/2017 1158   BASOSABS 0.0 05/31/2017 1158   Iron/TIBC/Ferritin/ %Sat No results found for: IRON, TIBC, FERRITIN, IRONPCTSAT Lipid Panel     Component Value Date/Time   CHOL 168 05/31/2017 1158   TRIG 144 05/31/2017 1158   HDL 59 05/31/2017 1158   LDLCALC 80 05/31/2017 1158   Hepatic Function Panel     Component Value Date/Time   PROT 6.5 05/31/2017 1158   ALBUMIN 4.2 05/31/2017 1158   AST 26 05/31/2017 1158   ALT 16 05/31/2017 1158   ALKPHOS 106 05/31/2017 1158   BILITOT 0.6 05/31/2017 1158       Component Value Date/Time   TSH 1.110 05/31/2017 1158    ASSESSMENT AND PLAN: Vitamin D deficiency - Plan: Vitamin D, Ergocalciferol, (DRISDOL) 50000 units CAPS capsule  Prediabetes - Plan: metFORMIN (GLUCOPHAGE) 500 MG tablet  Class 3 obesity with serious comorbidity and body mass index (BMI) of 40.0 to 44.9 in adult, unspecified obesity type ([place])  PLAN:  Vitamin D Deficiency Jenny Thomas was informed that low vitamin D levels contributes to fatigue and are associated  with obesity, breast, and colon cancer. She agrees to start to take prescription Vit D @50 ,000 IU every week #4 with no refills and we will re-check labs in 3 months and will follow up for routine testing of vitamin D, at least 2-3 times per year. She was informed of the risk of over-replacement of vitamin D and agrees to not increase her dose unless he discusses this with [name] first. Jenny Thomas agrees to follow up with our clinic in 2 weeks.  Pre-Diabetes Jenny Thomas will continue to work on weight loss, exercise, and decreasing simple carbohydrates in her diet to help decrease the risk of diabetes. We dicussed metformin including benefits and risks. She was informed that eating too many simple carbohydrates or too many calories at one sitting increases the likelihood of GI side effects. Jenny Thomas agrees to start metformin 500 mg every morning #30 with no refills and will follow up with [name] as directed to monitor her progress.  Obesity Jenny Thomas is currently in the action stage of change. As such, her goal is to continue with weight loss efforts She has agreed to follow the Category 1 plan Jenny Thomas has been instructed to work up to a goal of 150 minutes of combined cardio and strengthening exercise per week for weight loss and overall health benefits. We discussed the following Behavioral Modification Strategies today: no skipping meals, better snacking choices, increasing lean protein intake, decreasing simple carbohydrates  and dealing with  family or coworker sabotage  Jenny Thomas has agreed to follow up with our clinic in 2 weeks. She was informed of the importance of frequent follow up visits to maximize her success with intensive lifestyle modifications for her multiple health conditions.  I, Doreene Nest, am acting as transcriptionist for Dennard Nip, MD  I have reviewed the above documentation for accuracy and completeness, and I agree with the above. -Dennard Nip, MD  OBESITY BEHAVIORAL INTERVENTION VISIT  Today's visit was # 2 out of 77.  Starting weight: 217 lbs Starting date: 05/31/17 Today's weight : 210 lbs Today's date: 06/12/2017 Total lbs lost to date: 7 (Patients must lose 7 lbs in the first 6 months to continue with counseling)   ASK: We discussed the diagnosis of obesity with Jenny Thomas today and Jenny Thomas agreed to give [name] permission to discuss obesity behavioral modification therapy today.  ASSESS: Jenny Thomas has the diagnosis of obesity and her BMI today is 42.5 Jenny Thomas is in the action stage of change   ADVISE: Jenny Thomas was educated on the multiple health risks of obesity as well as the benefit of weight loss to improve her health. She was advised of the need for long term treatment and the importance of lifestyle modifications.  AGREE: Multiple dietary modification options and treatment options were discussed and  Jenny Thomas agreed to follow the Category 1 plan We discussed the following Behavioral Modification Strategies today: no skipping meals, better snacking choices, increasing lean protein intake, decreasing simple carbohydrates  and dealing with family or coworker sabotage

## 2017-06-21 DIAGNOSIS — H40003 Preglaucoma, unspecified, bilateral: Secondary | ICD-10-CM | POA: Diagnosis not present

## 2017-06-28 ENCOUNTER — Ambulatory Visit (INDEPENDENT_AMBULATORY_CARE_PROVIDER_SITE_OTHER): Payer: Commercial Managed Care - HMO | Admitting: Family Medicine

## 2017-06-28 VITALS — BP 103/69 | HR 117 | Temp 98.0°F | Ht 59.0 in | Wt 208.0 lb

## 2017-06-28 DIAGNOSIS — R7303 Prediabetes: Secondary | ICD-10-CM

## 2017-06-28 DIAGNOSIS — E669 Obesity, unspecified: Secondary | ICD-10-CM

## 2017-06-28 DIAGNOSIS — Z6841 Body Mass Index (BMI) 40.0 and over, adult: Secondary | ICD-10-CM

## 2017-06-28 DIAGNOSIS — IMO0001 Reserved for inherently not codable concepts without codable children: Secondary | ICD-10-CM

## 2017-06-28 MED ORDER — METFORMIN HCL 500 MG PO TABS: 500.0000 mg | ORAL_TABLET | Freq: Two times a day (BID) | ORAL | 0 refills | Status: DC

## 2017-06-28 NOTE — Progress Notes (Signed)
Office: [phone]  /  Fax: [phone]   HPI:   Chief Complaint: OBESITY Jenny Thomas is here to discuss her progress with her obesity treatment plan. She is on the  follow the Category 1 plan and is following her eating plan approximately 70 % of the time. She states she is exercising 0 minutes 0 times per week. Anneke has done well with weight loss but is discouraged she isn't losing faster. She notes struggling with hunger, cravings and temptations. Her weight is 208 lb (94.3 kg) today and has had a weight loss of 2 pounds over a period of 2 weeks since her last visit. She has lost 9 lbs since starting treatment with [name].  Pre-Diabetes Jenny Thomas has a diagnosis of pre-diabetes based on her elevated HgA1c and was informed this puts her at greater risk of developing diabetes. She is on metformin but is still struggling with polyphagia. Jenny Thomas continues to work on diet and exercise to decrease risk of diabetes. She denies nausea or hypoglycemia.   ALLERGIES: Allergies  Allergen Reactions  . Percocet [Oxycodone-Acetaminophen]     MEDICATIONS: Current Outpatient Prescriptions on File Prior to Visit  Medication Sig Dispense Refill  . albuterol (ACCUNEB) 0.63 MG/3ML nebulizer solution Take 1 ampule by nebulization every 6 (six) hours as needed for wheezing.    [name] albuterol (PROVENTIL HFA;VENTOLIN HFA) 108 (90 Base) MCG/ACT inhaler Inhale into the lungs every 6 (six) hours as needed for wheezing or shortness of breath.    [name] alendronate (FOSAMAX) 70 MG tablet Take 70 mg by mouth once a week. Take with a full glass of water on an empty stomach.    . Calcium Carb-Cholecalciferol (CALCIUM/VITAMIN D) 500-200 MG-UNIT TABS Take by mouth daily. Take one tablet by mouth once a day    . citalopram (CELEXA) 40 MG tablet Take 40 mg by mouth daily.    . Fluticasone-Salmeterol (ADVAIR) 250-50 MCG/DOSE AEPB Inhale 1 puff into the lungs 2 (two) times daily.    . furosemide (LASIX) 20 MG tablet Take 20 mg by  mouth daily as needed.    [name] levothyroxine (SYNTHROID, LEVOTHROID) 88 MCG tablet Take 88 mcg by mouth daily before breakfast.    . losartan (COZAAR) 50 MG tablet Take 50 mg by mouth daily.    . montelukast (SINGULAIR) 10 MG tablet Take 10 mg by mouth at bedtime.    . potassium chloride SA (K-DUR,KLOR-CON) 20 MEQ tablet Take 20 mEq by mouth once.    . simvastatin (ZOCOR) 20 MG tablet Take 20 mg by mouth daily.    . Vitamin D, Ergocalciferol, (DRISDOL) 50000 units CAPS capsule Take 1 capsule (50,000 Units total) by mouth every 7 (seven) days. 4 capsule 0   No current facility-administered medications on file prior to visit.     PAST MEDICAL HISTORY: Past Medical History:  Diagnosis Date  . Anxiety   . Asthma   . Back pain   . Constipation   . COPD (chronic obstructive pulmonary disease) ([place])   . Depression   . Dyspnea   . Gallbladder problem   . Hyperlipidemia   . Hypertension   . Hypothyroid   . Joint pain   . Leg edema   . Prediabetes     PAST SURGICAL HISTORY: Past Surgical History:  Procedure Laterality Date  . ABDOMINAL HYSTERECTOMY    . CHOLECYSTECTOMY    . hernia repair    . LAPAROSCOPIC GASTRIC BANDING      SOCIAL HISTORY: Social History  Substance Use Topics  .  Smoking status: Never Smoker  . Smokeless tobacco: Never Used  . Alcohol use Not on file    FAMILY HISTORY: Family History  Problem Relation Age of Onset  . Breast cancer Maternal Aunt   . Hypertension Mother   . Kidney disease Mother   . Obesity Mother   . Stroke Father   . Heart disease Father   . Alcoholism Father     ROS: Review of Systems  Constitutional: Positive for weight loss.  Gastrointestinal: Negative for nausea.  Endo/Heme/Allergies:       Polyphagia Negative hypoglycemia    PHYSICAL EXAM: Blood pressure 103/69, pulse (!) 117, temperature 98 F (36.7 C), height 4\' 11"  (1.499 m), weight 208 lb (94.3 kg), SpO2 96 %. Body mass index is 42.01 kg/m. Physical Exam    Constitutional: She is oriented to person, place, and time. She appears well-developed and well-nourished.  Cardiovascular: Normal rate.   Pulmonary/Chest: Effort normal.  Musculoskeletal: Normal range of motion.  Neurological: She is oriented to person, place, and time.  Skin: Skin is warm and dry.  Psychiatric: She has a normal mood and affect. Her behavior is normal.  Vitals reviewed.   RECENT LABS AND TESTS: BMET    Component Value Date/Time   NA 141 05/31/2017 1158   K 4.1 05/31/2017 1158   CL 101 05/31/2017 1158   CO2 24 05/31/2017 1158   GLUCOSE 90 05/31/2017 1158   BUN 13 05/31/2017 1158   CREATININE 1.16 (H) 05/31/2017 1158   CALCIUM 9.1 05/31/2017 1158   GFRNONAA 48 (L) 05/31/2017 1158   GFRAA 56 (L) 05/31/2017 1158   Lab Results  Component Value Date   HGBA1C 5.7 (H) 05/31/2017   Lab Results  Component Value Date   INSULIN 19.4 05/31/2017   CBC    Component Value Date/Time   WBC 5.4 05/31/2017 1158   RBC 4.58 05/31/2017 1158   HGB 13.9 05/31/2017 1158   HCT 41.0 05/31/2017 1158   MCV 90 05/31/2017 1158   MCH 30.3 05/31/2017 1158   MCHC 33.9 05/31/2017 1158   RDW 15.0 05/31/2017 1158   LYMPHSABS 1.8 05/31/2017 1158   EOSABS 0.1 05/31/2017 1158   BASOSABS 0.0 05/31/2017 1158   Iron/TIBC/Ferritin/ %Sat No results found for: IRON, TIBC, FERRITIN, IRONPCTSAT Lipid Panel     Component Value Date/Time   CHOL 168 05/31/2017 1158   TRIG 144 05/31/2017 1158   HDL 59 05/31/2017 1158   LDLCALC 80 05/31/2017 1158   Hepatic Function Panel     Component Value Date/Time   PROT 6.5 05/31/2017 1158   ALBUMIN 4.2 05/31/2017 1158   AST 26 05/31/2017 1158   ALT 16 05/31/2017 1158   ALKPHOS 106 05/31/2017 1158   BILITOT 0.6 05/31/2017 1158      Component Value Date/Time   TSH 1.110 05/31/2017 1158    ASSESSMENT AND PLAN: Prediabetes - Plan: metFORMIN (GLUCOPHAGE) 500 MG tablet  Class 3 obesity with serious comorbidity and body mass index (BMI) of  40.0 to 44.9 in adult, unspecified obesity type ([place])  PLAN:  Pre-Diabetes Ria will continue to work on weight loss, exercise, and decreasing simple carbohydrates in her diet to help decrease the risk of diabetes. We dicussed metformin including benefits and risks. She was informed that eating too many simple carbohydrates or too many calories at one sitting increases the likelihood of GI side effects. Jenny Thomas agrees to increase metformin to 500 mg bid #60 with no refills and will follow up with [name] as directed  to monitor her progress.  We spent > than 50% of the 30 minute visit on the counseling as documented in the note.   Obesity Shamell is currently in the action stage of change. As such, her goal is to continue with weight loss efforts She has agreed to follow the Category 1 plan Breyah has been instructed to work up to a goal of 150 minutes of combined cardio and strengthening exercise per week for weight loss and overall health benefits. We discussed the following Behavioral Modification Strategies today: better snacking choices, increasing lean protein intake, decreasing simple carbohydrates  and holiday eating strategies   Jenny Thomas has agreed to follow up with our clinic in 2 weeks. She was informed of the importance of frequent follow up visits to maximize her success with intensive lifestyle modifications for her multiple health conditions.  I, Doreene Nest, am acting as transcriptionist for Dennard Nip, MD  I have reviewed the above documentation for accuracy and completeness, and I agree with the above. -Dennard Nip, MD   OBESITY BEHAVIORAL INTERVENTION VISIT  Today's visit was # 3 out of 22.  Starting weight: 217 lbs Starting date: 05/31/17 Today's weight : 208 lbs  Today's date: 06/28/2017 Total lbs lost to date: 9 (Patients must lose 7 lbs in the first 6 months to continue with counseling)   ASK: We discussed the diagnosis of obesity with Keon F Mcneil  today and Tylee agreed to give [name] permission to discuss obesity behavioral modification therapy today.  ASSESS: Jenny Thomas has the diagnosis of obesity and her BMI today is 42.1 Jenny Thomas is in the action stage of change   ADVISE: Jenny Thomas was educated on the multiple health risks of obesity as well as the benefit of weight loss to improve her health. She was advised of the need for long term treatment and the importance of lifestyle modifications.  AGREE: Multiple dietary modification options and treatment options were discussed and  Jenny Thomas agreed to follow the Category 1 plan We discussed the following Behavioral Modification Strategies today: better snacking choices, increasing lean protein intake, decreasing simple carbohydrates  and holiday eating strategies

## 2017-06-30 DIAGNOSIS — R0602 Shortness of breath: Secondary | ICD-10-CM | POA: Diagnosis not present

## 2017-07-11 ENCOUNTER — Ambulatory Visit (INDEPENDENT_AMBULATORY_CARE_PROVIDER_SITE_OTHER): Payer: Commercial Managed Care - HMO | Admitting: Family Medicine

## 2017-07-11 VITALS — BP 101/54 | HR 67 | Temp 97.5°F | Ht 59.0 in | Wt 204.0 lb

## 2017-07-11 DIAGNOSIS — E559 Vitamin D deficiency, unspecified: Secondary | ICD-10-CM

## 2017-07-11 DIAGNOSIS — IMO0001 Reserved for inherently not codable concepts without codable children: Secondary | ICD-10-CM

## 2017-07-11 DIAGNOSIS — E669 Obesity, unspecified: Secondary | ICD-10-CM | POA: Diagnosis not present

## 2017-07-11 DIAGNOSIS — Z6841 Body Mass Index (BMI) 40.0 and over, adult: Secondary | ICD-10-CM | POA: Diagnosis not present

## 2017-07-11 DIAGNOSIS — I10 Essential (primary) hypertension: Secondary | ICD-10-CM | POA: Diagnosis not present

## 2017-07-11 MED ORDER — VITAMIN D (ERGOCALCIFEROL) 1.25 MG (50000 UNIT) PO CAPS: 50000.0000 [IU] | ORAL_CAPSULE | ORAL | 0 refills | Status: DC

## 2017-07-11 NOTE — Progress Notes (Signed)
Office: [phone]  /  Fax: [phone]   HPI:   Chief Complaint: OBESITY Jenny Thomas is here to discuss her progress with her obesity treatment plan. She is on the  follow the Category 1 plan and is following her eating plan approximately 85 % of the time. She states she is exercising 0 minutes 0 times per week. Jenny Thomas continues to do well with weight loss. She plans ahead well and is motivated about the weight loss.  Her weight is 204 lb (92.5 kg) today and has had a weight loss of 4 pounds over a period of 2 weeks since her last visit. She has lost 13 lbs since starting treatment with [name].  Vitamin D deficiency Jenny Thomas has a diagnosis of vitamin D deficiency. She is currently taking vit D and denies nausea, vomiting or muscle weakness.  Hypertension Jenny Thomas is a 71 y.o. female with hypertension.  Jenny Thomas felt dizzy and noticed BP was dropping low denies chest pain or shortness of breath on exertion. She is working weight loss to help control her blood pressure with the goal of decreasing her risk of heart attack and stroke. Jenny Thomas blood pressure is currently controlled.     ALLERGIES: Allergies  Allergen Reactions  . Percocet [Oxycodone-Acetaminophen]     MEDICATIONS: Current Outpatient Prescriptions on File Prior to Visit  Medication Sig Dispense Refill  . albuterol (ACCUNEB) 0.63 MG/3ML nebulizer solution Take 1 ampule by nebulization every 6 (six) hours as needed for wheezing.    [name] albuterol (PROVENTIL HFA;VENTOLIN HFA) 108 (90 Base) MCG/ACT inhaler Inhale into the lungs every 6 (six) hours as needed for wheezing or shortness of breath.    [name] alendronate (FOSAMAX) 70 MG tablet Take 70 mg by mouth once a week. Take with a full glass of water on an empty stomach.    . Calcium Carb-Cholecalciferol (CALCIUM/VITAMIN D) 500-200 MG-UNIT TABS Take by mouth daily. Take one tablet by mouth once a day    . citalopram (CELEXA) 40 MG tablet Take 40 mg by mouth daily.     . Fluticasone-Salmeterol (ADVAIR) 250-50 MCG/DOSE AEPB Inhale 1 puff into the lungs 2 (two) times daily.    . furosemide (LASIX) 20 MG tablet Take 20 mg by mouth daily as needed.    [name] levothyroxine (SYNTHROID, LEVOTHROID) 88 MCG tablet Take 88 mcg by mouth daily before breakfast.    . losartan (COZAAR) 50 MG tablet Take 50 mg by mouth daily.    . metFORMIN (GLUCOPHAGE) 500 MG tablet Take 1 tablet (500 mg total) by mouth 2 (two) times daily with a meal. 60 tablet 0  . montelukast (SINGULAIR) 10 MG tablet Take 10 mg by mouth at bedtime.    . potassium chloride SA (K-DUR,KLOR-CON) 20 MEQ tablet Take 20 mEq by mouth once.    . simvastatin (ZOCOR) 20 MG tablet Take 20 mg by mouth daily.     No current facility-administered medications on file prior to visit.     PAST MEDICAL HISTORY: Past Medical History:  Diagnosis Date  . Anxiety   . Asthma   . Back pain   . Constipation   . COPD (chronic obstructive pulmonary disease) ([place])   . Depression   . Dyspnea   . Gallbladder problem   . Hyperlipidemia   . Hypertension   . Hypothyroid   . Joint pain   . Leg edema   . Prediabetes     PAST SURGICAL HISTORY: Past Surgical History:  Procedure Laterality Date  .  ABDOMINAL HYSTERECTOMY    . CHOLECYSTECTOMY    . hernia repair    . LAPAROSCOPIC GASTRIC BANDING      SOCIAL HISTORY: Social History  Substance Use Topics  . Smoking status: Never Smoker  . Smokeless tobacco: Never Used  . Alcohol use Not on file    FAMILY HISTORY: Family History  Problem Relation Age of Onset  . Breast cancer Maternal Aunt   . Hypertension Mother   . Kidney disease Mother   . Obesity Mother   . Stroke Father   . Heart disease Father   . Alcoholism Father     ROS: Review of Systems  Constitutional: Positive for weight loss.  Respiratory: Negative for shortness of breath.   Cardiovascular: Negative for chest pain.  Gastrointestinal: Negative for nausea and vomiting.  Musculoskeletal:        Negative muscle weakness  Neurological: Positive for dizziness. Negative for headaches.    PHYSICAL EXAM: Blood pressure (!) 101/54, pulse 67, temperature (!) 97.5 F (36.4 C), height 4\' 11"  (1.499 m), weight 204 lb (92.5 kg), SpO2 93 %. Body mass index is 41.2 kg/m. Physical Exam  Constitutional: She is oriented to person, place, and time. She appears well-developed and well-nourished.  Cardiovascular: Normal rate.   Pulmonary/Chest: Effort normal.  Musculoskeletal: Normal range of motion.  Neurological: She is alert and oriented to person, place, and time.  Skin: Skin is warm and dry.  Psychiatric: She has a normal mood and affect.    RECENT LABS AND TESTS: BMET    Component Value Date/Time   NA 141 05/31/2017 1158   K 4.1 05/31/2017 1158   CL 101 05/31/2017 1158   CO2 24 05/31/2017 1158   GLUCOSE 90 05/31/2017 1158   BUN 13 05/31/2017 1158   CREATININE 1.16 (H) 05/31/2017 1158   CALCIUM 9.1 05/31/2017 1158   GFRNONAA 48 (L) 05/31/2017 1158   GFRAA 56 (L) 05/31/2017 1158   Lab Results  Component Value Date   HGBA1C 5.7 (H) 05/31/2017   Lab Results  Component Value Date   INSULIN 19.4 05/31/2017   CBC    Component Value Date/Time   WBC 5.4 05/31/2017 1158   RBC 4.58 05/31/2017 1158   HGB 13.9 05/31/2017 1158   HCT 41.0 05/31/2017 1158   MCV 90 05/31/2017 1158   MCH 30.3 05/31/2017 1158   MCHC 33.9 05/31/2017 1158   RDW 15.0 05/31/2017 1158   LYMPHSABS 1.8 05/31/2017 1158   EOSABS 0.1 05/31/2017 1158   BASOSABS 0.0 05/31/2017 1158   Iron/TIBC/Ferritin/ %Sat No results found for: IRON, TIBC, FERRITIN, IRONPCTSAT Lipid Panel     Component Value Date/Time   CHOL 168 05/31/2017 1158   TRIG 144 05/31/2017 1158   HDL 59 05/31/2017 1158   LDLCALC 80 05/31/2017 1158   Hepatic Function Panel     Component Value Date/Time   PROT 6.5 05/31/2017 1158   ALBUMIN 4.2 05/31/2017 1158   AST 26 05/31/2017 1158   ALT 16 05/31/2017 1158   ALKPHOS 106 05/31/2017  1158   BILITOT 0.6 05/31/2017 1158      Component Value Date/Time   TSH 1.110 05/31/2017 1158    ASSESSMENT AND PLAN: Vitamin D deficiency - Plan: Vitamin D, Ergocalciferol, (DRISDOL) 50000 units CAPS capsule  Essential hypertension  Class 3 obesity with serious comorbidity and body mass index (BMI) of 40.0 to 44.9 in adult, unspecified obesity type ([facility])  PLAN:  Vitamin D Deficiency Jenny Thomas was informed that low vitamin D levels contributes  to fatigue and are associated with obesity, breast, and colon cancer. She agrees to continue to take prescription Vit D @50 ,000 IU every week, a refill was written today, and will follow up for routine testing of vitamin D, at least 2-3 times per year. She was informed of the risk of over-replacement of vitamin D and agrees to not increase her dose unless he discusses this with [name] first.  Hypertension We discussed sodium restriction, working on healthy weight loss, and a regular exercise program as the means to achieve improved blood pressure control. Jenny Thomas agreed with this plan and agreed to follow up as directed. We will continue to monitor her blood pressure as well as her progress with the above lifestyle modifications. She will continue her medications as prescribed and will watch for signs of hypotension as she continues her lifestyle modifications.  Obesity Jenny Thomas is currently in the action stage of change. As such, her goal is to continue with weight loss efforts She has agreed to follow the Category 1 plan Jenny Thomas has been instructed to work up to a goal of 150 minutes of combined cardio and strengthening exercise per week for weight loss and overall health benefits. We discussed the following Behavioral Modification Stratagies today: increasing lean protein intake and work on meal planning and easy cooking plans   Jenny Thomas has agreed to follow up with our clinic in 2 weeks. She was informed of the importance of frequent follow up visits  to maximize her success with intensive lifestyle modifications for her multiple health conditions.   Office: [phone]  /  Fax: [phone]  OBESITY BEHAVIORAL INTERVENTION VISIT  Today's visit was # 4 out of 22.  Starting weight: 217 Starting date: 05/31/17 Today's weight : Weight: 204 lb (92.5 kg)  Today's date: 07/11/2017 Total lbs lost to date: 13 (Patients must lose 7 lbs in the first 6 months to continue with counseling)   ASK: We discussed the diagnosis of obesity with Jenny Thomas today and Jenny Thomas agreed to give [name] permission to discuss obesity behavioral modification therapy today.  ASSESS: Jenny Thomas has the diagnosis of obesity and her BMI today is 41.3 Jenny Thomas is in the action stage of change   ADVISE: Jenny Thomas was educated on the multiple health risks of obesity as well as the benefit of weight loss to improve her health. She was advised of the need for long term treatment and the importance of lifestyle modifications.  AGREE: Multiple dietary modification options and treatment options were discussed and  Jenny Thomas agreed to follow the Category 1 plan We discussed the following Behavioral Modification Stratagies today: increasing lean protein intake and work on meal planning and easy cooking plans    I have reviewed the above documentation for accuracy and completeness, and I agree with the above. -Lacy Duverney, PA-C  I have reviewed the above note and agree with the plan. -Dennard Nip, MD

## 2017-07-25 ENCOUNTER — Ambulatory Visit (INDEPENDENT_AMBULATORY_CARE_PROVIDER_SITE_OTHER): Payer: Commercial Managed Care - HMO | Admitting: Physician Assistant

## 2017-07-25 VITALS — BP 120/71 | HR 55 | Temp 97.7°F | Ht 59.0 in | Wt 203.0 lb

## 2017-07-25 DIAGNOSIS — E669 Obesity, unspecified: Secondary | ICD-10-CM | POA: Diagnosis not present

## 2017-07-25 DIAGNOSIS — E1122 Type 2 diabetes mellitus with diabetic chronic kidney disease: Secondary | ICD-10-CM

## 2017-07-25 DIAGNOSIS — E119 Type 2 diabetes mellitus without complications: Secondary | ICD-10-CM | POA: Insufficient documentation

## 2017-07-25 DIAGNOSIS — I10 Essential (primary) hypertension: Secondary | ICD-10-CM | POA: Insufficient documentation

## 2017-07-25 DIAGNOSIS — Z6841 Body Mass Index (BMI) 40.0 and over, adult: Secondary | ICD-10-CM | POA: Diagnosis not present

## 2017-07-25 DIAGNOSIS — IMO0001 Reserved for inherently not codable concepts without codable children: Secondary | ICD-10-CM | POA: Insufficient documentation

## 2017-07-25 MED ORDER — INSULIN PEN NEEDLE 32G X 4 MM MISC: 1.0000 | Freq: Every day | 0 refills | Status: DC

## 2017-07-25 MED ORDER — BLOOD GLUCOSE MONITOR KIT: PACK | 0 refills | Status: DC

## 2017-07-25 MED ORDER — LIRAGLUTIDE 18 MG/3ML ~~LOC~~ SOPN: 0.6000 mg | PEN_INJECTOR | Freq: Every day | SUBCUTANEOUS | 0 refills | Status: DC

## 2017-07-25 NOTE — Progress Notes (Addendum)
Office: [phone]  /  Fax: [phone]   HPI:   Chief Complaint: OBESITY Jenny Thomas is here to discuss her progress with her obesity treatment plan. She is on the  follow the Category 1 plan and is following her eating plan approximately 80 % of the time. She states she is exercising 0 minutes 0 times per week. Belia continues to do well with weight loss. States hunger is well controlled. Would like more variety for breakfast.  Her weight is 203 lb (92.1 kg) today and has had a weight loss of 1 pounds over a period of 2 weeks since her last visit. She has lost 14 lbs since starting treatment with [name].  Diabetes II Jenny Thomas states her A1c in the past has been 6.6 and has had elevated creatinine and reduced GFR. She does not have Glucometer and thus has not been checking her blood sugar at home. She denies any signs of hypoglycemia. Last A1c was 5.7 on 05/31/17.  She has been working on intensive lifestyle modifications including diet, exercise, and weight loss to help control her blood glucose levels.  Hypertension Jenny Thomas Jenny Thomas is a 71 y.o. female with hypertension.   Jenny Thomas denies chest pain or shortness of breath on exertion. She is working weight loss to help control her blood pressure with the goal of decreasing her risk of heart attack and stroke.  Jenny Thomas was advised by [name] to decrease her Losartan to 50 mg but states she felt dizzy and so increased her dose to 75 mg. Current blood pressure is  controlled.     ALLERGIES: Allergies  Allergen Reactions  . Percocet [Oxycodone-Acetaminophen]     MEDICATIONS: Current Outpatient Prescriptions on File Prior to Visit  Medication Sig Dispense Refill  . albuterol (ACCUNEB) 0.63 MG/3ML nebulizer solution Take 1 ampule by nebulization every 6 (six) hours as needed for wheezing.    [name] albuterol (PROVENTIL HFA;VENTOLIN HFA) 108 (90 Base) MCG/ACT inhaler Inhale into the lungs every 6 (six) hours as needed for wheezing or  shortness of breath.    [name] alendronate (FOSAMAX) 70 MG tablet Take 70 mg by mouth once a week. Take with a full glass of water on an empty stomach.    . Calcium Carb-Cholecalciferol (CALCIUM/VITAMIN D) 500-200 MG-UNIT TABS Take by mouth daily. Take one tablet by mouth once a day    . citalopram (CELEXA) 40 MG tablet Take 40 mg by mouth daily.    . Fluticasone-Salmeterol (ADVAIR) 250-50 MCG/DOSE AEPB Inhale 1 puff into the lungs 2 (two) times daily.    . furosemide (LASIX) 20 MG tablet Take 20 mg by mouth daily as needed.    [name] levothyroxine (SYNTHROID, LEVOTHROID) 88 MCG tablet Take 88 mcg by mouth daily before breakfast.    . losartan (COZAAR) 50 MG tablet Take 75 mg by mouth daily.     . montelukast (SINGULAIR) 10 MG tablet Take 10 mg by mouth at bedtime.    . potassium chloride SA (K-DUR,KLOR-CON) 20 MEQ tablet Take 20 mEq by mouth once.    . simvastatin (ZOCOR) 20 MG tablet Take 20 mg by mouth daily.    . Vitamin D, Ergocalciferol, (DRISDOL) 50000 units CAPS capsule Take 1 capsule (50,000 Units total) by mouth every 7 (seven) days. 4 capsule 0   No current facility-administered medications on file prior to visit.     PAST MEDICAL HISTORY: Past Medical History:  Diagnosis Date  . Anxiety   . Asthma   . Back pain   .  Constipation   . COPD (chronic obstructive pulmonary disease) ([place])   . Depression   . Dyspnea   . Gallbladder problem   . Hyperlipidemia   . Hypertension   . Hypothyroid   . Joint pain   . Leg edema   . Prediabetes     PAST SURGICAL HISTORY: Past Surgical History:  Procedure Laterality Date  . ABDOMINAL HYSTERECTOMY    . CHOLECYSTECTOMY    . hernia repair    . LAPAROSCOPIC GASTRIC BANDING      SOCIAL HISTORY: Social History  Substance Use Topics  . Smoking status: Never Smoker  . Smokeless tobacco: Never Used  . Alcohol use Not on file    FAMILY HISTORY: Family History  Problem Relation Age of Onset  . Breast cancer Maternal Aunt   . Hypertension  Mother   . Kidney disease Mother   . Obesity Mother   . Stroke Father   . Heart disease Father   . Alcoholism Father     ROS: Review of Systems  Constitutional: Positive for weight loss.  Respiratory: Negative for shortness of breath.   Cardiovascular: Negative for chest pain.  Endo/Heme/Allergies:       Negative polyphagia or hypoglycemia    PHYSICAL EXAM: Blood pressure 120/71, pulse (!) 55, temperature 97.7 F (36.5 C), height 4' 11"  (1.499 m), weight 203 lb (92.1 kg), SpO2 97 %. Body mass index is 41 kg/m. Physical Exam  Constitutional: She is oriented to person, place, and time. She appears well-developed and well-nourished.  Cardiovascular:  Bradycardic  Pulmonary/Chest: Effort normal.  Musculoskeletal: Normal range of motion.  Neurological: She is alert and oriented to person, place, and time.  Skin: Skin is warm and dry.  Psychiatric: She has a normal mood and affect.    RECENT LABS AND TESTS: BMET    Component Value Date/Time   NA 141 05/31/2017 1158   K 4.1 05/31/2017 1158   CL 101 05/31/2017 1158   CO2 24 05/31/2017 1158   GLUCOSE 90 05/31/2017 1158   BUN 13 05/31/2017 1158   CREATININE 1.16 (H) 05/31/2017 1158   CALCIUM 9.1 05/31/2017 1158   GFRNONAA 48 (L) 05/31/2017 1158   GFRAA 56 (L) 05/31/2017 1158   Lab Results  Component Value Date   HGBA1C 5.7 (H) 05/31/2017   Lab Results  Component Value Date   INSULIN 19.4 05/31/2017   CBC    Component Value Date/Time   WBC 5.4 05/31/2017 1158   RBC 4.58 05/31/2017 1158   HGB 13.9 05/31/2017 1158   HCT 41.0 05/31/2017 1158   MCV 90 05/31/2017 1158   MCH 30.3 05/31/2017 1158   MCHC 33.9 05/31/2017 1158   RDW 15.0 05/31/2017 1158   LYMPHSABS 1.8 05/31/2017 1158   EOSABS 0.1 05/31/2017 1158   BASOSABS 0.0 05/31/2017 1158   Iron/TIBC/Ferritin/ %Sat No results found for: IRON, TIBC, FERRITIN, IRONPCTSAT Lipid Panel     Component Value Date/Time   CHOL 168 05/31/2017 1158   TRIG 144  05/31/2017 1158   HDL 59 05/31/2017 1158   LDLCALC 80 05/31/2017 1158   Hepatic Function Panel     Component Value Date/Time   PROT 6.5 05/31/2017 1158   ALBUMIN 4.2 05/31/2017 1158   AST 26 05/31/2017 1158   ALT 16 05/31/2017 1158   ALKPHOS 106 05/31/2017 1158   BILITOT 0.6 05/31/2017 1158      Component Value Date/Time   TSH 1.110 05/31/2017 1158    ASSESSMENT AND PLAN: Type 2 diabetes mellitus  with chronic kidney disease, without long-term current use of insulin, unspecified CKD stage ([place]) - Plan: liraglutide 18 MG/3ML SOPN, blood glucose meter kit and supplies KIT, Insulin Pen Needle (BD PEN NEEDLE NANO U/F) 32G X 4 MM MISC  Essential hypertension  Class 3 obesity with serious comorbidity and body mass index (BMI) of 40.0 to 44.9 in adult, unspecified obesity type (HCC)  PLAN:  Diabetes II Jenny Thomas has been given extensive diabetes education by myself today including ideal fasting and post-prandial blood glucose readings, individual ideal HgA1c goals  and hypoglycemia prevention. We discussed the importance of good blood sugar control to decrease the likelihood of diabetic complications such as nephropathy, neuropathy, limb loss, blindness, coronary artery disease, and death. We discussed the importance of intensive lifestyle modification including diet, exercise and weight loss as the first line treatment for diabetes. Jenny Thomas agrees to start taking Victoza 0.6 mg qd, we will refill nanoneedles and to keep a blood sugar log and bring it in for review at her next follow up.   Hypertension We discussed sodium restriction, working on healthy weight loss, and a regular exercise program as the means to achieve improved blood pressure control. Jenny Thomas agreed with this plan and agreed to follow up as directed. We will continue to monitor her blood pressure as well as her progress with the above lifestyle modifications. She will continue taking Losartan 75 mg qd, and we will watch for  signs of hypotension as she continues her lifestyle modifications.  Obesity Jenny Thomas is currently in the action stage of change. As such, her goal is to continue with weight loss efforts She has agreed to keep a food journal with 1000 calories and 80+g protein  Jenny Thomas has been instructed to work up to a goal of 150 minutes of combined cardio and strengthening exercise per week for weight loss and overall health benefits. We discussed the following Behavioral Modification Stratagies today: increasing lean protein intake and work on meal planning and easy cooking plans   Jenny Thomas has agreed to follow up with our clinic in 2 weeks. She was informed of the importance of frequent follow up visits to maximize her success with intensive lifestyle modifications for her multiple health conditions.   Office: [phone]  /  Fax: [phone]  OBESITY BEHAVIORAL INTERVENTION VISIT  Today's visit was # 5 out of 22.  Starting weight: 217 Starting date: 05/31/17 Today's weight : Weight: 203 lb (92.1 kg)  Today's date: 07/25/2017 Total lbs lost to date: 43 (Patients must lose 7 lbs in the first 6 months to continue with counseling)   ASK: We discussed the diagnosis of obesity with Jenny Thomas today and Jenny Thomas agreed to give [name] permission to discuss obesity behavioral modification therapy today.  ASSESS: Jelitza has the diagnosis of obesity and her BMI today is 41.1 Derisha is in the action stage of change   ADVISE: Satcha was educated on the multiple health risks of obesity as well as the benefit of weight loss to improve her health. She was advised of the need for long term treatment and the importance of lifestyle modifications.  AGREE: Multiple dietary modification options and treatment options were discussed and  Revecca agreed to keep a food journal with 1000 calories and 80+g protein  We discussed the following Behavioral Modification Stratagies today: increasing lean protein  intake and work on meal planning and easy cooking plans    I have reviewed the above documentation for accuracy and completeness, and I agree with the above. -  Lacy Duverney, PA-C  I have reviewed the above note and agree with the plan. -Dennard Nip, MD

## 2017-07-26 ENCOUNTER — Telehealth (INDEPENDENT_AMBULATORY_CARE_PROVIDER_SITE_OTHER): Payer: Self-pay | Admitting: Family Medicine

## 2017-07-26 ENCOUNTER — Other Ambulatory Visit (INDEPENDENT_AMBULATORY_CARE_PROVIDER_SITE_OTHER): Payer: Self-pay

## 2017-07-26 DIAGNOSIS — E119 Type 2 diabetes mellitus without complications: Secondary | ICD-10-CM

## 2017-07-26 MED ORDER — ACCU-CHEK SOFT TOUCH LANCETS MISC: 0 refills | Status: DC

## 2017-07-26 MED ORDER — GLUCOSE BLOOD VI STRP: ORAL_STRIP | 0 refills | Status: DC

## 2017-07-26 MED ORDER — ACCU-CHEK NANO SMARTVIEW W/DEVICE KIT: 1.0000 | PACK | Freq: Every day | 0 refills | Status: AC

## 2017-07-26 MED ORDER — ACCU-CHEK FASTCLIX LANCETS MISC: 1.0000 | Freq: Two times a day (BID) | 0 refills | Status: DC

## 2017-07-26 NOTE — Telephone Encounter (Signed)
[facility] called to say meter supplies needs to be escribed , separate presc for each item per pharmacy  With exact directions and quanty and icd10 code in order to process with medicare  Best number if needed [phone]

## 2017-07-26 NOTE — Telephone Encounter (Signed)
I called [employer] to find out the correct glucometer to request.  Re-sent the prescription over with all of the information requested  by the pharmacy.  Will follow up with the pharmacy to make sure they received it.

## 2017-07-31 DIAGNOSIS — R0602 Shortness of breath: Secondary | ICD-10-CM | POA: Diagnosis not present

## 2017-08-08 ENCOUNTER — Ambulatory Visit (INDEPENDENT_AMBULATORY_CARE_PROVIDER_SITE_OTHER): Payer: Commercial Managed Care - HMO | Admitting: Physician Assistant

## 2017-08-08 VITALS — BP 111/60 | HR 54 | Temp 98.0°F | Ht 59.0 in | Wt 203.0 lb

## 2017-08-08 DIAGNOSIS — E669 Obesity, unspecified: Secondary | ICD-10-CM

## 2017-08-08 DIAGNOSIS — E559 Vitamin D deficiency, unspecified: Secondary | ICD-10-CM | POA: Diagnosis not present

## 2017-08-08 DIAGNOSIS — Z6841 Body Mass Index (BMI) 40.0 and over, adult: Secondary | ICD-10-CM | POA: Diagnosis not present

## 2017-08-08 DIAGNOSIS — E119 Type 2 diabetes mellitus without complications: Secondary | ICD-10-CM | POA: Diagnosis not present

## 2017-08-08 DIAGNOSIS — IMO0001 Reserved for inherently not codable concepts without codable children: Secondary | ICD-10-CM

## 2017-08-08 NOTE — Progress Notes (Signed)
Office: [phone]  /  Fax: [phone]   HPI:   Chief Complaint: OBESITY Jenny Thomas is here to discuss her progress with her obesity treatment plan. She is on the keep a food journal with 1000 calories and 80+ grams of protein daily and is following her eating plan approximately 80 % of the time. She states she is exercising 0 minutes 0 times per week. [name] maintained her weight and hunger is controlled. She has not started Victoza yet.  Her weight is 203 lb (92.1 kg) today and has maintained weight over a period of 2 weeks since her last visit. She has lost 14 lbs since starting treatment with [name].  Vitamin D deficiency [name] has a diagnosis of vitamin D deficiency. She is currently taking vit D and denies nausea, vomiting or muscle weakness.  Diabetes II [name] has a diagnosis of diabetes type II. [name] states fasting BGs range between 80's and 115 and post prandial range between 80 and 120's and she denies hypoglycemia. Her last A1c was at 5.7 She has not started Victoza yet and is wondering about hypoglycemia. She has been working on intensive lifestyle modifications including diet, exercise, and weight loss to help control her blood glucose levels.  ALLERGIES: Allergies  Allergen Reactions  . Percocet [Oxycodone-Acetaminophen]     MEDICATIONS: Current Outpatient Prescriptions on File Prior to Visit  Medication Sig Dispense Refill  . ACCU-CHEK FASTCLIX LANCETS MISC 1 each by Does not apply route 2 (two) times daily. 102 each 0  . albuterol (ACCUNEB) 0.63 MG/3ML nebulizer solution Take 1 ampule by nebulization every 6 (six) hours as needed for wheezing.    [name] albuterol (PROVENTIL HFA;VENTOLIN HFA) 108 (90 Base) MCG/ACT inhaler Inhale into the lungs every 6 (six) hours as needed for wheezing or shortness of breath.    [name] alendronate (FOSAMAX) 70 MG tablet Take 70 mg by mouth once a week. Take with a full glass of water on an empty stomach.    . Blood Glucose Monitoring Suppl  (ACCU-CHEK NANO SMARTVIEW) w/Device KIT 1 each by Does not apply route daily. Check blood sugar twice a day. 1 kit 0  . Calcium Carb-Cholecalciferol (CALCIUM/VITAMIN D) 500-200 MG-UNIT TABS Take by mouth daily. Take one tablet by mouth once a day    . citalopram (CELEXA) 40 MG tablet Take 40 mg by mouth daily.    . Fluticasone-Salmeterol (ADVAIR) 250-50 MCG/DOSE AEPB Inhale 1 puff into the lungs 2 (two) times daily.    . furosemide (LASIX) 20 MG tablet Take 20 mg by mouth daily as needed.    [name] glucose blood test strip Check blood sugar twice a day 100 each 0  . Insulin Pen Needle (BD PEN NEEDLE NANO U/F) 32G X 4 MM MISC 1 each by Does not apply route daily. 100 each 0  . levothyroxine (SYNTHROID, LEVOTHROID) 88 MCG tablet Take 88 mcg by mouth daily before breakfast.    . liraglutide 18 MG/3ML SOPN Inject 0.1 mLs (0.6 mg total) into the skin daily. 3 pen 0  . losartan (COZAAR) 50 MG tablet Take 75 mg by mouth daily.     . montelukast (SINGULAIR) 10 MG tablet Take 10 mg by mouth at bedtime.    . potassium chloride SA (K-DUR,KLOR-CON) 20 MEQ tablet Take 20 mEq by mouth once.    . simvastatin (ZOCOR) 20 MG tablet Take 20 mg by mouth daily.     No current facility-administered medications on file prior to visit.     PAST MEDICAL  HISTORY: Past Medical History:  Diagnosis Date  . Anxiety   . Asthma   . Back pain   . Constipation   . COPD (chronic obstructive pulmonary disease) ([place])   . Depression   . Dyspnea   . Gallbladder problem   . Hyperlipidemia   . Hypertension   . Hypothyroid   . Joint pain   . Leg edema   . Prediabetes     PAST SURGICAL HISTORY: Past Surgical History:  Procedure Laterality Date  . ABDOMINAL HYSTERECTOMY    . CHOLECYSTECTOMY    . hernia repair    . LAPAROSCOPIC GASTRIC BANDING      SOCIAL HISTORY: Social History  Substance Use Topics  . Smoking status: Never Smoker  . Smokeless tobacco: Never Used  . Alcohol use Not on file    FAMILY  HISTORY: Family History  Problem Relation Age of Onset  . Breast cancer Maternal Aunt   . Hypertension Mother   . Kidney disease Mother   . Obesity Mother   . Stroke Father   . Heart disease Father   . Alcoholism Father     ROS: Review of Systems  Constitutional: Negative for weight loss.  Gastrointestinal: Negative for nausea and vomiting.  Musculoskeletal:       Negative muscle weakness  Endo/Heme/Allergies:       Negative hypoglycemia    PHYSICAL EXAM: Blood pressure 111/60, pulse (!) 54, temperature 98 F (36.7 C), height 4' 11"  (1.499 m), weight 203 lb (92.1 kg), SpO2 97 %. Body mass index is 41 kg/m. Physical Exam  Constitutional: She is oriented to person, place, and time. She appears well-developed and well-nourished.  Cardiovascular:  bradycardic  Pulmonary/Chest: Effort normal.  Musculoskeletal: Normal range of motion.  Neurological: She is oriented to person, place, and time.  Skin: Skin is warm and dry.  Psychiatric: She has a normal mood and affect. Her behavior is normal.  Vitals reviewed.   RECENT LABS AND TESTS: BMET    Component Value Date/Time   NA 141 05/31/2017 1158   K 4.1 05/31/2017 1158   CL 101 05/31/2017 1158   CO2 24 05/31/2017 1158   GLUCOSE 90 05/31/2017 1158   BUN 13 05/31/2017 1158   CREATININE 1.16 (H) 05/31/2017 1158   CALCIUM 9.1 05/31/2017 1158   GFRNONAA 48 (L) 05/31/2017 1158   GFRAA 56 (L) 05/31/2017 1158   Lab Results  Component Value Date   HGBA1C 5.7 (H) 05/31/2017   Lab Results  Component Value Date   INSULIN 19.4 05/31/2017   CBC    Component Value Date/Time   WBC 5.4 05/31/2017 1158   RBC 4.58 05/31/2017 1158   HGB 13.9 05/31/2017 1158   HCT 41.0 05/31/2017 1158   MCV 90 05/31/2017 1158   MCH 30.3 05/31/2017 1158   MCHC 33.9 05/31/2017 1158   RDW 15.0 05/31/2017 1158   LYMPHSABS 1.8 05/31/2017 1158   EOSABS 0.1 05/31/2017 1158   BASOSABS 0.0 05/31/2017 1158   Iron/TIBC/Ferritin/ %Sat No results  found for: IRON, TIBC, FERRITIN, IRONPCTSAT Lipid Panel     Component Value Date/Time   CHOL 168 05/31/2017 1158   TRIG 144 05/31/2017 1158   HDL 59 05/31/2017 1158   LDLCALC 80 05/31/2017 1158   Hepatic Function Panel     Component Value Date/Time   PROT 6.5 05/31/2017 1158   ALBUMIN 4.2 05/31/2017 1158   AST 26 05/31/2017 1158   ALT 16 05/31/2017 1158   ALKPHOS 106 05/31/2017 1158  BILITOT 0.6 05/31/2017 1158      Component Value Date/Time   TSH 1.110 05/31/2017 1158    ASSESSMENT AND PLAN: Vitamin D deficiency - Plan: Vitamin D, Ergocalciferol, (DRISDOL) 50000 units CAPS capsule  Type 2 diabetes mellitus without complication, without long-term current use of insulin (HCC)  Class 3 obesity without serious comorbidity with body mass index (BMI) of 40.0 to 44.9 in adult, unspecified obesity type ([place])  PLAN:  Vitamin D Deficiency Beila was informed that low vitamin D levels contributes to fatigue and are associated with obesity, breast, and colon cancer. She agrees to continue to take prescription Vit D @50 ,000 IU every week, we will refill for 1 month and will follow up for routine testing of vitamin D, at least 2-3 times per year. She was informed of the risk of over-replacement of vitamin D and agrees to not increase her dose unless he discusses this with [name] first. Corriine agrees to follow up with our clinic in 2 weeks.  Diabetes II Tocara has been given extensive diabetes education by myself today including ideal fasting and post-prandial blood glucose readings, individual ideal Hgb A1c goals and hypoglycemia prevention. We discussed the importance of good blood sugar control to decrease the likelihood of diabetic complications such as nephropathy, neuropathy, limb loss, blindness, coronary artery disease, and death. We discussed the importance of intensive lifestyle modification including diet, exercise and weight loss as the first line treatment for diabetes. Trine  agrees to continue Victoza and will follow up at the agreed upon time.  Obesity Ricki is currently in the action stage of change. As such, her goal is to continue with weight loss efforts She has agreed to keep a food journal with 1000 calories and 80 grams of protein daily Apryll has been instructed to work up to a goal of 150 minutes of combined cardio and strengthening exercise per week for weight loss and overall health benefits. We discussed the following Behavioral Modification Strategies today: meal planning & cooking strategies and increasing lean protein intake Aubriel agrees to continue Victoza  Mikhala has agreed to follow up with our clinic in 2 weeks. She was informed of the importance of frequent follow up visits to maximize her success with intensive lifestyle modifications for her multiple health conditions.  I, Doreene Nest, am acting as transcriptionist for Lacy Duverney, PA-C  I have reviewed the above documentation for accuracy and completeness, and I agree with the above. -Lacy Duverney, PA-C  I have reviewed the above note and agree with the plan. -Dennard Nip, MD   OBESITY BEHAVIORAL INTERVENTION VISIT  Today's visit was # 6 out of 22.  Starting weight: 217 lbs Starting date: 05/31/17 Today's weight : 203 lbs Today's date: 08/09/2017 Total lbs lost to date: 14 (Patients must lose 7 lbs in the first 6 months to continue with counseling)   ASK: We discussed the diagnosis of obesity with Martie F [place] today and Kashvi agreed to give [name] permission to discuss obesity behavioral modification therapy today.  ASSESS: Ellason has the diagnosis of obesity and her BMI today is 40.98 Jestine is in the action stage of change   ADVISE: Lianah was educated on the multiple health risks of obesity as well as the benefit of weight loss to improve her health. She was advised of the need for long term treatment and the importance of lifestyle  modifications.  AGREE: Multiple dietary modification options and treatment options were discussed and  Giannie agreed to keep a food journal  with 1000 calories and 80 grams of protein daily We discussed the following Behavioral Modification Strategies today: meal planning & cooking strategies and increasing lean protein intake

## 2017-08-09 MED ORDER — VITAMIN D (ERGOCALCIFEROL) 1.25 MG (50000 UNIT) PO CAPS: 50000.0000 [IU] | ORAL_CAPSULE | ORAL | 0 refills | Status: DC

## 2017-08-15 DIAGNOSIS — G4734 Idiopathic sleep related nonobstructive alveolar hypoventilation: Secondary | ICD-10-CM | POA: Diagnosis not present

## 2017-08-15 DIAGNOSIS — R0602 Shortness of breath: Secondary | ICD-10-CM | POA: Diagnosis not present

## 2017-08-15 DIAGNOSIS — J449 Chronic obstructive pulmonary disease, unspecified: Secondary | ICD-10-CM | POA: Diagnosis not present

## 2017-08-22 ENCOUNTER — Ambulatory Visit (INDEPENDENT_AMBULATORY_CARE_PROVIDER_SITE_OTHER): Payer: Self-pay | Admitting: Physician Assistant

## 2017-08-22 ENCOUNTER — Ambulatory Visit (INDEPENDENT_AMBULATORY_CARE_PROVIDER_SITE_OTHER): Payer: Commercial Managed Care - HMO | Admitting: Physician Assistant

## 2017-08-22 VITALS — BP 98/63 | HR 69 | Temp 98.0°F | Ht 59.0 in | Wt 201.0 lb

## 2017-08-22 DIAGNOSIS — IMO0001 Reserved for inherently not codable concepts without codable children: Secondary | ICD-10-CM

## 2017-08-22 DIAGNOSIS — E669 Obesity, unspecified: Secondary | ICD-10-CM | POA: Diagnosis not present

## 2017-08-22 DIAGNOSIS — Z6841 Body Mass Index (BMI) 40.0 and over, adult: Secondary | ICD-10-CM | POA: Diagnosis not present

## 2017-08-22 DIAGNOSIS — I10 Essential (primary) hypertension: Secondary | ICD-10-CM

## 2017-08-22 MED ORDER — LOSARTAN POTASSIUM 50 MG PO TABS: 50.0000 mg | ORAL_TABLET | Freq: Every day | ORAL | 0 refills | Status: DC

## 2017-08-22 NOTE — Progress Notes (Addendum)
Office: [phone]  /  Fax: [phone]   HPI:   Chief Complaint: OBESITY Jenny Thomas is here to discuss her progress with her obesity treatment plan. She is on the Category 1 plan and is following her eating plan approximately 50 % of the time. She states she is exercising 0 minutes 0 times per week. Jenny Thomas continues to do well with weight loss. She states that she has had an emotional time during the past 2 weeks with the loss of her dog, but she sustained and she is feeling better now.  States she feels more motivated to get back on track. Her weight is 201 lb (91.2 kg) today and has had a weight loss of 2 pounds over a period of 2 weeks since her last visit. She has lost 16 lbs since starting treatment with [name].  Hypertension Jenny Thomas is a 71 y.o. female with hypertension. Her blood pressure is low in the office today and she admit to increase fatigue recently. She denies any dizziness, weakness, chest pain or dyspnea.  She is working on weight loss to help control her blood pressure with the goal of decreasing her risk of heart attack and stroke.    ALLERGIES: Allergies  Allergen Reactions  . Percocet [Oxycodone-Acetaminophen]     MEDICATIONS: Current Outpatient Prescriptions on File Prior to Visit  Medication Sig Dispense Refill  . ACCU-CHEK FASTCLIX LANCETS MISC 1 each by Does not apply route 2 (two) times daily. 102 each 0  . albuterol (ACCUNEB) 0.63 MG/3ML nebulizer solution Take 1 ampule by nebulization every 6 (six) hours as needed for wheezing.    [name] albuterol (PROVENTIL HFA;VENTOLIN HFA) 108 (90 Base) MCG/ACT inhaler Inhale into the lungs every 6 (six) hours as needed for wheezing or shortness of breath.    [name] alendronate (FOSAMAX) 70 MG tablet Take 70 mg by mouth once a week. Take with a full glass of water on an empty stomach.    . Blood Glucose Monitoring Suppl (ACCU-CHEK NANO SMARTVIEW) w/Device KIT 1 each by Does not apply route daily. Check blood sugar twice a  day. 1 kit 0  . Calcium Carb-Cholecalciferol (CALCIUM/VITAMIN D) 500-200 MG-UNIT TABS Take by mouth daily. Take one tablet by mouth once a day    . citalopram (CELEXA) 40 MG tablet Take 40 mg by mouth daily.    . Fluticasone-Salmeterol (ADVAIR) 250-50 MCG/DOSE AEPB Inhale 1 puff into the lungs 2 (two) times daily.    . furosemide (LASIX) 20 MG tablet Take 20 mg by mouth daily as needed.    [name] glucose blood test strip Check blood sugar twice a day 100 each 0  . Insulin Pen Needle (BD PEN NEEDLE NANO U/F) 32G X 4 MM MISC 1 each by Does not apply route daily. 100 each 0  . levothyroxine (SYNTHROID, LEVOTHROID) 88 MCG tablet Take 88 mcg by mouth daily before breakfast.    . liraglutide 18 MG/3ML SOPN Inject 0.1 mLs (0.6 mg total) into the skin daily. 3 pen 0  . montelukast (SINGULAIR) 10 MG tablet Take 10 mg by mouth at bedtime.    . potassium chloride SA (K-DUR,KLOR-CON) 20 MEQ tablet Take 20 mEq by mouth once.    . simvastatin (ZOCOR) 20 MG tablet Take 20 mg by mouth daily.    . Vitamin D, Ergocalciferol, (DRISDOL) 50000 units CAPS capsule Take 1 capsule (50,000 Units total) by mouth every 7 (seven) days. 4 capsule 0   No current facility-administered medications on file prior to visit.  PAST MEDICAL HISTORY: Past Medical History:  Diagnosis Date  . Anxiety   . Asthma   . Back pain   . Constipation   . COPD (chronic obstructive pulmonary disease) ([place])   . Depression   . Dyspnea   . Gallbladder problem   . Hyperlipidemia   . Hypertension   . Hypothyroid   . Joint pain   . Leg edema   . Prediabetes     PAST SURGICAL HISTORY: Past Surgical History:  Procedure Laterality Date  . ABDOMINAL HYSTERECTOMY    . CHOLECYSTECTOMY    . hernia repair    . LAPAROSCOPIC GASTRIC BANDING      SOCIAL HISTORY: Social History  Substance Use Topics  . Smoking status: Never Smoker  . Smokeless tobacco: Never Used  . Alcohol use Not on file    FAMILY HISTORY: Family History  Problem  Relation Age of Onset  . Breast cancer Maternal Aunt   . Hypertension Mother   . Kidney disease Mother   . Obesity Mother   . Stroke Father   . Heart disease Father   . Alcoholism Father     ROS: Review of Systems  Constitutional: Positive for malaise/fatigue and weight loss.  Respiratory: Negative for shortness of breath.   Cardiovascular: Negative for chest pain.  Gastrointestinal: Negative for nausea.  Neurological: Negative for dizziness, loss of consciousness and weakness.    PHYSICAL EXAM: Blood pressure 98/63, pulse 69, temperature 98 F (36.7 C), height 4' 11"  (1.499 m), weight 201 lb (91.2 kg), SpO2 96 %. Body mass index is 40.6 kg/m. Physical Exam  Constitutional: She is oriented to person, place, and time. She appears well-developed and well-nourished.  Cardiovascular: Normal rate.   Pulmonary/Chest: Effort normal.  Musculoskeletal: Normal range of motion.  Neurological: She is oriented to person, place, and time.  Skin: Skin is warm and dry.  Psychiatric: She has a normal mood and affect. Her behavior is normal.  Vitals reviewed.   RECENT LABS AND TESTS: BMET    Component Value Date/Time   NA 141 05/31/2017 1158   K 4.1 05/31/2017 1158   CL 101 05/31/2017 1158   CO2 24 05/31/2017 1158   GLUCOSE 90 05/31/2017 1158   BUN 13 05/31/2017 1158   CREATININE 1.16 (H) 05/31/2017 1158   CALCIUM 9.1 05/31/2017 1158   GFRNONAA 48 (L) 05/31/2017 1158   GFRAA 56 (L) 05/31/2017 1158   Lab Results  Component Value Date   HGBA1C 5.7 (H) 05/31/2017   Lab Results  Component Value Date   INSULIN 19.4 05/31/2017   CBC    Component Value Date/Time   WBC 5.4 05/31/2017 1158   RBC 4.58 05/31/2017 1158   HGB 13.9 05/31/2017 1158   HCT 41.0 05/31/2017 1158   MCV 90 05/31/2017 1158   MCH 30.3 05/31/2017 1158   MCHC 33.9 05/31/2017 1158   RDW 15.0 05/31/2017 1158   LYMPHSABS 1.8 05/31/2017 1158   EOSABS 0.1 05/31/2017 1158   BASOSABS 0.0 05/31/2017 1158    Iron/TIBC/Ferritin/ %Sat No results found for: IRON, TIBC, FERRITIN, IRONPCTSAT Lipid Panel     Component Value Date/Time   CHOL 168 05/31/2017 1158   TRIG 144 05/31/2017 1158   HDL 59 05/31/2017 1158   LDLCALC 80 05/31/2017 1158   Hepatic Function Panel     Component Value Date/Time   PROT 6.5 05/31/2017 1158   ALBUMIN 4.2 05/31/2017 1158   AST 26 05/31/2017 1158   ALT 16 05/31/2017 1158   ALKPHOS  106 05/31/2017 1158   BILITOT 0.6 05/31/2017 1158      Component Value Date/Time   TSH 1.110 05/31/2017 1158    ASSESSMENT AND PLAN: Essential hypertension - Plan: losartan (COZAAR) 50 MG tablet  Class 3 obesity with serious comorbidity and body mass index (BMI) of 40.0 to 44.9 in adult, unspecified obesity type (HCC)  PLAN:  Hypertension We discussed sodium restriction, working on healthy weight loss, and a regular exercise program as the means to achieve improved blood pressure control. Due to increase recent fatigue and low BP on current visit, Jenny Thomas is advised to decrease her Losartan to 50 mg once daily. She is encouraged to keep a BP log to assess her BP with decrease in the BP medicine and is advised to bring BP log in to next visit for review.  Jenny Thomas agreed to watch for signs of hypotension as she continues her lifestyle modifications with this plan and agreed to follow up as directed. We will continue to monitor her blood pressure as well as her progress with the above lifestyle modifications. Jenny Thomas agrees to follow up with our clinic in 2 weeks.  We spent > than 50% of the 15 minute visit on the counseling as documented in the note.   Obesity Jenny Thomas is currently in the action stage of change. As such, her goal is to continue with weight loss efforts She has agreed to follow the Category 1 plan Jenny Thomas has been instructed to work up to a goal of 150 minutes of combined cardio and strengthening exercise per week for weight loss and overall health benefits. We  discussed the following Behavioral Modification Strategies today: increasing lean protein intake and work on meal planning and easy cooking plans   Jenny Thomas has agreed to follow up with our clinic in 2 weeks. She was informed of the importance of frequent follow up visits to maximize her success with intensive lifestyle modifications for her multiple health conditions.  I, Jenny Thomas, am acting as transcriptionist for Jenny Duverney, PA-C  I have reviewed the above documentation for accuracy and completeness, and I agree with the above. -Jenny Duverney, PA-C  I have reviewed the above note and agree with the plan. -Dennard Nip, MD   OBESITY BEHAVIORAL INTERVENTION VISIT  Today's visit was # 7 out of 22.  Starting weight: 217 lbs Starting date: 05/31/17 Today's weight: 201 lbs Today's date: 08/22/17 Total lbs lost to date: 16 (Patients must lose 7 lbs in the first 6 months to continue with counseling)   ASK: We discussed the diagnosis of obesity with Jenny Thomas today and Jenny Thomas agreed to give [name] permission to discuss obesity behavioral modification therapy today.  ASSESS: [place] has the diagnosis of obesity and her BMI today is 21 Shirah is in the action stage of change   ADVISE: Dena was educated on the multiple health risks of obesity as well as the benefit of weight loss to improve her health. She was advised of the need for long term treatment and the importance of lifestyle modifications.  AGREE: Multiple dietary modification options and treatment options were discussed and  Ninette agreed to follow the Category 1 plan We discussed the following Behavioral Modification Strategies today: increasing lean protein intake and work on meal planning and easy cooking plans

## 2017-08-31 DIAGNOSIS — R0602 Shortness of breath: Secondary | ICD-10-CM | POA: Diagnosis not present

## 2017-09-04 ENCOUNTER — Ambulatory Visit (INDEPENDENT_AMBULATORY_CARE_PROVIDER_SITE_OTHER): Payer: Medicare HMO | Admitting: Physician Assistant

## 2017-09-04 VITALS — BP 126/75 | HR 103 | Temp 98.0°F | Ht 59.0 in | Wt 203.0 lb

## 2017-09-04 DIAGNOSIS — I10 Essential (primary) hypertension: Secondary | ICD-10-CM

## 2017-09-04 DIAGNOSIS — IMO0001 Reserved for inherently not codable concepts without codable children: Secondary | ICD-10-CM

## 2017-09-04 DIAGNOSIS — Z6841 Body Mass Index (BMI) 40.0 and over, adult: Secondary | ICD-10-CM | POA: Diagnosis not present

## 2017-09-04 DIAGNOSIS — E559 Vitamin D deficiency, unspecified: Secondary | ICD-10-CM | POA: Diagnosis not present

## 2017-09-04 DIAGNOSIS — E669 Obesity, unspecified: Secondary | ICD-10-CM

## 2017-09-04 MED ORDER — VITAMIN D (ERGOCALCIFEROL) 1.25 MG (50000 UNIT) PO CAPS: 50000.0000 [IU] | ORAL_CAPSULE | ORAL | 0 refills | Status: DC

## 2017-09-04 NOTE — Progress Notes (Signed)
Office: [phone]  /  Fax: [phone]   HPI:   Chief Complaint: OBESITY Jenny Thomas is here to discuss her progress with her obesity treatment plan. She is on the Category 1 plan and is following her eating plan approximately 75 % of the time. She states she is exercising 0 minutes 0 times per week. Jenny Thomas continues to make smarter food choices and is controlling her portions. She is unable to afford healthier food options. Her weight is 203 lb (92.1 kg) Thomas and has had a weight gain of 2 lbs over a period of 2 weeks since her last visit. She has lost 14 lbs since starting treatment with [name].  Vitamin D deficiency Jenny Thomas has a diagnosis of vitamin D deficiency. She is currently taking vit D and denies nausea, vomiting or muscle weakness.  Hypertension Jenny Thomas is a 71 y.o. female with hypertension. Losartan was decreased from 75 mg to 50 mg. Jenny Thomas denies chest pain or shortness of breath on exertion. She is working weight loss to help control her blood pressure with the goal of decreasing her risk of heart attack and stroke. Jenny Thomas blood pressure is currently controlled.  ALLERGIES: Allergies  Allergen Reactions   Percocet [Oxycodone-Acetaminophen]     MEDICATIONS: Current Outpatient Prescriptions on File Prior to Visit  Medication Sig Dispense Refill   ACCU-CHEK FASTCLIX LANCETS MISC 1 each by Does not apply route 2 (two) times daily. 102 each 0   albuterol (ACCUNEB) 0.63 MG/3ML nebulizer solution Take 1 ampule by nebulization every 6 (six) hours as needed for wheezing.     albuterol (PROVENTIL HFA;VENTOLIN HFA) 108 (90 Base) MCG/ACT inhaler Inhale into the lungs every 6 (six) hours as needed for wheezing or shortness of breath.     alendronate (FOSAMAX) 70 MG tablet Take 70 mg by mouth once a week. Take with a full glass of water on an empty stomach.     Blood Glucose Monitoring Suppl (ACCU-CHEK NANO SMARTVIEW) w/Device KIT 1 each by Does not apply  route daily. Check blood sugar twice a day. 1 kit 0   Calcium Carb-Cholecalciferol (CALCIUM/VITAMIN D) 500-200 MG-UNIT TABS Take by mouth daily. Take one tablet by mouth once a day     citalopram (CELEXA) 40 MG tablet Take 40 mg by mouth daily.     Fluticasone-Salmeterol (ADVAIR) 250-50 MCG/DOSE AEPB Inhale 1 puff into the lungs 2 (two) times daily.     furosemide (LASIX) 20 MG tablet Take 20 mg by mouth daily as needed.     glucose blood test strip Check blood sugar twice a day 100 each 0   Insulin Pen Needle (BD PEN NEEDLE NANO U/F) 32G X 4 MM MISC 1 each by Does not apply route daily. 100 each 0   levothyroxine (SYNTHROID, LEVOTHROID) 88 MCG tablet Take 88 mcg by mouth daily before breakfast.     liraglutide 18 MG/3ML SOPN Inject 0.1 mLs (0.6 mg total) into the skin daily. 3 pen 0   losartan (COZAAR) 50 MG tablet Take 1 tablet (50 mg total) by mouth daily. 30 tablet 0   montelukast (SINGULAIR) 10 MG tablet Take 10 mg by mouth at bedtime.     potassium chloride SA (K-DUR,KLOR-CON) 20 MEQ tablet Take 20 mEq by mouth once.     simvastatin (ZOCOR) 20 MG tablet Take 20 mg by mouth daily.     No current facility-administered medications on file prior to visit.     PAST MEDICAL HISTORY: Past Medical History:  Diagnosis Date   Anxiety    Asthma    Back pain    Constipation    COPD (chronic obstructive pulmonary disease) (HCC)    Depression    Dyspnea    Gallbladder problem    Hyperlipidemia    Hypertension    Hypothyroid    Joint pain    Leg edema    Prediabetes     PAST SURGICAL HISTORY: Past Surgical History:  Procedure Laterality Date   ABDOMINAL HYSTERECTOMY     CHOLECYSTECTOMY     hernia repair     LAPAROSCOPIC GASTRIC BANDING      SOCIAL HISTORY: Social History  Substance Use Topics   Smoking status: Never Smoker   Smokeless tobacco: Never Used   Alcohol use Not on file    FAMILY HISTORY: Family History  Problem Relation Age of  Onset   Breast cancer Maternal Aunt    Hypertension Mother    Kidney disease Mother    Obesity Mother    Stroke Father    Heart disease Father    Alcoholism Father     ROS: Review of Systems  Constitutional: Negative for weight loss.  Respiratory: Negative for shortness of breath (on exertion).   Cardiovascular: Negative for chest pain.  Gastrointestinal: Negative for nausea and vomiting.  Musculoskeletal:       Negative muscle weakness    PHYSICAL EXAM: Blood pressure 126/75, pulse (!) 103, temperature 98 F (36.7 C), height 4' 11"  (1.499 m), weight 203 lb (92.1 kg), SpO2 95 %. Body mass index is 41 kg/m. Physical Exam  Constitutional: She is oriented to person, place, and time. She appears well-developed and well-nourished.  Cardiovascular: Normal rate.   Pulmonary/Chest: Effort normal.  Musculoskeletal: Normal range of motion.  Neurological: She is oriented to person, place, and time.  Skin: Skin is warm and dry.  Psychiatric: She has a normal mood and affect. Her behavior is normal.  Vitals reviewed.   RECENT LABS AND TESTS: BMET    Component Value Date/Time   NA 141 05/31/2017 1158   K 4.1 05/31/2017 1158   CL 101 05/31/2017 1158   CO2 24 05/31/2017 1158   GLUCOSE 90 05/31/2017 1158   BUN 13 05/31/2017 1158   CREATININE 1.16 (H) 05/31/2017 1158   CALCIUM 9.1 05/31/2017 1158   GFRNONAA 48 (L) 05/31/2017 1158   GFRAA 56 (L) 05/31/2017 1158   Lab Results  Component Value Date   HGBA1C 5.7 (H) 05/31/2017   Lab Results  Component Value Date   INSULIN 19.4 05/31/2017   CBC    Component Value Date/Time   WBC 5.4 05/31/2017 1158   RBC 4.58 05/31/2017 1158   HGB 13.9 05/31/2017 1158   HCT 41.0 05/31/2017 1158   MCV 90 05/31/2017 1158   MCH 30.3 05/31/2017 1158   MCHC 33.9 05/31/2017 1158   RDW 15.0 05/31/2017 1158   LYMPHSABS 1.8 05/31/2017 1158   EOSABS 0.1 05/31/2017 1158   BASOSABS 0.0 05/31/2017 1158   Iron/TIBC/Ferritin/ %Sat No  results found for: IRON, TIBC, FERRITIN, IRONPCTSAT Lipid Panel     Component Value Date/Time   CHOL 168 05/31/2017 1158   TRIG 144 05/31/2017 1158   HDL 59 05/31/2017 1158   LDLCALC 80 05/31/2017 1158   Hepatic Function Panel     Component Value Date/Time   PROT 6.5 05/31/2017 1158   ALBUMIN 4.2 05/31/2017 1158   AST 26 05/31/2017 1158   ALT 16 05/31/2017 1158   ALKPHOS 106 05/31/2017 1158  BILITOT 0.6 05/31/2017 1158      Component Value Date/Time   TSH 1.110 05/31/2017 1158    ASSESSMENT AND PLAN: Vitamin D deficiency - Plan: Vitamin D, Ergocalciferol, (DRISDOL) 50000 units CAPS capsule  Essential hypertension  Class 3 obesity with serious comorbidity and body mass index (BMI) of 40.0 to 44.9 in adult, unspecified obesity type (HCC)  PLAN:  Vitamin D Deficiency Jenny Thomas was informed that low vitamin D levels contributes to fatigue and are associated with obesity, breast, and colon cancer. She agrees to continue to take prescription Vit D @50 ,000 IU every week,Jenny will refill for 1 month and will follow up for routine testing of vitamin D, at least 2-3 times per year. She was informed of the risk of over-replacement of vitamin D and agrees to not increase her dose unless he discusses this with [name] first. Jenny Thomas agrees to follow up with our clinic in 3 weeks.  Hypertension Jenny discussed sodium restriction, working on healthy weight loss, and a regular exercise program as the means to achieve improved blood pressure control. Jenny Thomas agreed with this plan and agreed to follow up as directed. Jenny will continue to monitor her blood pressure as well as her progress with the above lifestyle modifications. She agrees to continue Losartan @ 50 mg qd and she will watch for signs of hypotension as she continues her lifestyle modifications.  Obesity Jenny Thomas is currently in the action stage of change. As such, her goal is to continue with weight loss efforts She has agreed to portion  control better and make smarter food choices, such as increase vegetables and decrease simple carbohydrates  Jenny Thomas has been instructed to work up to a goal of 150 minutes of combined cardio and strengthening exercise per week for weight loss and overall health benefits. Jenny discussed the following Behavioral Modification Strategies Thomas: increasing lean protein intake and work on meal planning and easy cooking plans  Jenny Thomas has agreed to follow up with our clinic in 3 weeks. She was informed of the importance of frequent follow up visits to maximize her success with intensive lifestyle modifications for her multiple health conditions.  I, Doreene Nest, am acting as transcriptionist for Lacy Duverney, PA-C  I have reviewed the above documentation for accuracy and completeness, and I agree with the above. -Lacy Duverney, PA-C  I have reviewed the above note and agree with the plan. -Dennard Nip, MD   OBESITY BEHAVIORAL INTERVENTION VISIT  Thomas's visit was # 8 out of 22.  Starting weight: 217 lbs Starting date: 05/31/17 Thomas's weight : 203 lbs Thomas's date: 09/04/2017 Total lbs lost to date: 14 (Patients must lose 7 lbs in the first 6 months to continue with counseling)   ASK: Jenny Thomas and Jenny Thomas agreed to give [name] permission to discuss obesity behavioral modification therapy Thomas.  ASSESS: Jalie has the diagnosis of obesity and her BMI Thomas is 40.98 Renise is in the action stage of change   ADVISE: [place] was educated on the multiple health risks of obesity as well as the benefit of weight loss to improve her health. She was advised of the need for long term treatment and the importance of lifestyle modifications.  AGREE: Multiple dietary modification options and treatment options were discussed and  Marlise agreed to portion control better and make smarter food choices, such as increase vegetables and decrease simple  carbohydrates  Jenny discussed the following Behavioral Modification Strategies Thomas: increasing lean protein  intake and work on meal planning and easy cooking plans

## 2017-09-12 DIAGNOSIS — J449 Chronic obstructive pulmonary disease, unspecified: Secondary | ICD-10-CM | POA: Diagnosis not present

## 2017-09-25 ENCOUNTER — Ambulatory Visit (INDEPENDENT_AMBULATORY_CARE_PROVIDER_SITE_OTHER): Payer: Medicare HMO | Admitting: Physician Assistant

## 2017-09-25 VITALS — BP 102/66 | HR 57 | Temp 98.0°F | Ht 59.0 in | Wt 201.0 lb

## 2017-09-25 DIAGNOSIS — I10 Essential (primary) hypertension: Secondary | ICD-10-CM | POA: Diagnosis not present

## 2017-09-25 DIAGNOSIS — E7849 Other hyperlipidemia: Secondary | ICD-10-CM | POA: Diagnosis not present

## 2017-09-25 DIAGNOSIS — Z9189 Other specified personal risk factors, not elsewhere classified: Secondary | ICD-10-CM | POA: Diagnosis not present

## 2017-09-25 DIAGNOSIS — E559 Vitamin D deficiency, unspecified: Secondary | ICD-10-CM

## 2017-09-25 DIAGNOSIS — Z6841 Body Mass Index (BMI) 40.0 and over, adult: Secondary | ICD-10-CM

## 2017-09-25 DIAGNOSIS — E119 Type 2 diabetes mellitus without complications: Secondary | ICD-10-CM

## 2017-09-25 DIAGNOSIS — E038 Other specified hypothyroidism: Secondary | ICD-10-CM

## 2017-09-25 NOTE — Progress Notes (Signed)
Office: [phone]  /  Fax: [phone]   HPI:   Chief Complaint: OBESITY Jenny Thomas is here to discuss her progress with her obesity treatment plan. She is on the Category 1 plan and is following her eating plan approximately 90 % of the time. She states she is exercising 0 minutes 0 times per week. Jenny Thomas continues to do well with weight loss. She manages to make better food options and controls her portions. She states hunger is well controlled.  Her weight is 201 lb (91.2 kg) today and has had a weight loss of 2 pounds over a period of 3 weeks since her last visit. She has lost 16 lbs since starting treatment with Jenny Thomas.  Hypertension Jenny Thomas is a 71 y.o. female with hypertension. [place] F Scholes denies chest pain or shortness of breath. She is working weight loss to help control her blood pressure with the goal of decreasing her risk of heart attack and stroke. Corrines blood pressure is stable.  Vitamin D deficiency Jenny Thomas has a diagnosis of vitamin D deficiency. She is currently taking prescription Vit D and denies nausea, vomiting or muscle weakness.  Hyperlipidemia Jenny Thomas has hyperlipidemia and has been trying to improve her cholesterol levels with intensive lifestyle modification including a low saturated fat diet, exercise and weight loss. She denies any chest pain, claudication or myalgias.  Diabetes II Jenny Thomas has a diagnosis of diabetes type II. Jenny Thomas states fasting BGs range in 90's and denies any hypoglycemic episodes. Last A1c was 5.7 on 05/31/17. She has been working on intensive lifestyle modifications including diet, exercise, and weight loss to help control her blood glucose levels.  Hypothyroidism Jenny Thomas has a diagnosis of hypothyroidism. She is on levothyroxine. She denies hot or cold intolerance or palpitations, but does admit to ongoing fatigue.  ALLERGIES: Allergies  Allergen Reactions  . Percocet [Oxycodone-Acetaminophen]      MEDICATIONS: Current Outpatient Prescriptions on File Prior to Visit  Medication Sig Dispense Refill  . ACCU-CHEK FASTCLIX LANCETS MISC 1 each by Does not apply route 2 (two) times daily. 102 each 0  . albuterol (ACCUNEB) 0.63 MG/3ML nebulizer solution Take 1 ampule by nebulization every 6 (six) hours as needed for wheezing.    [name] albuterol (PROVENTIL HFA;VENTOLIN HFA) 108 (90 Base) MCG/ACT inhaler Inhale into the lungs every 6 (six) hours as needed for wheezing or shortness of breath.    [name] alendronate (FOSAMAX) 70 MG tablet Take 70 mg by mouth once a week. Take with a full glass of water on an empty stomach.    . Blood Glucose Monitoring Suppl (ACCU-CHEK NANO SMARTVIEW) w/Device KIT 1 each by Does not apply route daily. Check blood sugar twice a day. 1 kit 0  . Calcium Carb-Cholecalciferol (CALCIUM/VITAMIN D) 500-200 MG-UNIT TABS Take by mouth daily. Take one tablet by mouth once a day    . citalopram (CELEXA) 40 MG tablet Take 40 mg by mouth daily.    . Fluticasone-Salmeterol (ADVAIR) 250-50 MCG/DOSE AEPB Inhale 1 puff into the lungs 2 (two) times daily.    . furosemide (LASIX) 20 MG tablet Take 20 mg by mouth daily as needed.    [name] glucose blood test strip Check blood sugar twice a day 100 each 0  . Insulin Pen Needle (BD PEN NEEDLE NANO U/F) 32G X 4 MM MISC 1 each by Does not apply route daily. 100 each 0  . levothyroxine (SYNTHROID, LEVOTHROID) 88 MCG tablet Take 88 mcg by mouth daily before breakfast.    .  liraglutide 18 MG/3ML SOPN Inject 0.1 mLs (0.6 mg total) into the skin daily. 3 pen 0  . losartan (COZAAR) 50 MG tablet Take 1 tablet (50 mg total) by mouth daily. 30 tablet 0  . montelukast (SINGULAIR) 10 MG tablet Take 10 mg by mouth at bedtime.    . potassium chloride SA (K-DUR,KLOR-CON) 20 MEQ tablet Take 20 mEq by mouth once.    . simvastatin (ZOCOR) 20 MG tablet Take 20 mg by mouth daily.    . Vitamin D, Ergocalciferol, (DRISDOL) 50000 units CAPS capsule Take 1 capsule (50,000  Units total) by mouth every 7 (seven) days. 4 capsule 0   No current facility-administered medications on file prior to visit.     PAST MEDICAL HISTORY: Past Medical History:  Diagnosis Date  . Anxiety   . Asthma   . Back pain   . Constipation   . COPD (chronic obstructive pulmonary disease) ([place])   . Depression   . Dyspnea   . Gallbladder problem   . Hyperlipidemia   . Hypertension   . Hypothyroid   . Joint pain   . Leg edema   . Prediabetes     PAST SURGICAL HISTORY: Past Surgical History:  Procedure Laterality Date  . ABDOMINAL HYSTERECTOMY    . CHOLECYSTECTOMY    . hernia repair    . LAPAROSCOPIC GASTRIC BANDING      SOCIAL HISTORY: Social History  Substance Use Topics  . Smoking status: Never Smoker  . Smokeless tobacco: Never Used  . Alcohol use Not on file    FAMILY HISTORY: Family History  Problem Relation Age of Onset  . Breast cancer Maternal Aunt   . Hypertension Mother   . Kidney disease Mother   . Obesity Mother   . Stroke Father   . Heart disease Father   . Alcoholism Father     ROS: Review of Systems  Constitutional: Positive for malaise/fatigue and weight loss.       Negative hot/cold intolerance  Respiratory: Negative for shortness of breath.   Cardiovascular: Negative for chest pain, palpitations and claudication.  Gastrointestinal: Negative for nausea and vomiting.  Musculoskeletal: Negative for myalgias.       Negative muscle weakness  Endo/Heme/Allergies:       Negative hypoglycemia    PHYSICAL EXAM: Blood pressure 102/66, pulse (!) 57, temperature 98 F (36.7 C), temperature source Oral, height _0  (1.499 m), weight 201 lb (91.2 kg), SpO2 98 %. Body mass index is 40.6 kg/m. Physical Exam  Constitutional: She is oriented to person, place, and time. She appears well-developed and well-nourished.  Cardiovascular: Normal rate.   Pulmonary/Chest: Effort normal.  Musculoskeletal: Normal range of motion.  Neurological: She  is oriented to person, place, and time.  Skin: Skin is warm and dry.  Psychiatric: She has a normal mood and affect. Her behavior is normal.  Vitals reviewed.   RECENT LABS AND TESTS: BMET    Component Value Date/Time   NA 141 05/31/2017 1158   K 4.1 05/31/2017 1158   CL 101 05/31/2017 1158   CO2 24 05/31/2017 1158   GLUCOSE 90 05/31/2017 1158   BUN 13 05/31/2017 1158   CREATININE 1.16 (H) 05/31/2017 1158   CALCIUM 9.1 05/31/2017 1158   GFRNONAA 48 (L) 05/31/2017 1158   GFRAA 56 (L) 05/31/2017 1158   Lab Results  Component Value Date   HGBA1C 5.7 (H) 05/31/2017   Lab Results  Component Value Date   INSULIN 19.4 05/31/2017   CBC  Component Value Date/Time   WBC 5.4 05/31/2017 1158   RBC 4.58 05/31/2017 1158   HGB 13.9 05/31/2017 1158   HCT 41.0 05/31/2017 1158   MCV 90 05/31/2017 1158   MCH 30.3 05/31/2017 1158   MCHC 33.9 05/31/2017 1158   RDW 15.0 05/31/2017 1158   LYMPHSABS 1.8 05/31/2017 1158   EOSABS 0.1 05/31/2017 1158   BASOSABS 0.0 05/31/2017 1158   Iron/TIBC/Ferritin/ %Sat No results found for: IRON, TIBC, FERRITIN, IRONPCTSAT Lipid Panel     Component Value Date/Time   CHOL 168 05/31/2017 1158   TRIG 144 05/31/2017 1158   HDL 59 05/31/2017 1158   LDLCALC 80 05/31/2017 1158   Hepatic Function Panel     Component Value Date/Time   PROT 6.5 05/31/2017 1158   ALBUMIN 4.2 05/31/2017 1158   AST 26 05/31/2017 1158   ALT 16 05/31/2017 1158   ALKPHOS 106 05/31/2017 1158   BILITOT 0.6 05/31/2017 1158      Component Value Date/Time   TSH 1.110 05/31/2017 1158    ASSESSMENT AND PLAN: Vitamin D deficiency - Plan: VITAMIN D 25 Hydroxy (Vit-D Deficiency, Fractures)  Essential hypertension  Other hyperlipidemia - Plan: Lipid Panel With LDL/HDL Ratio  Other specified hypothyroidism - Plan: TSH, T4, free, T3  Type 2 diabetes mellitus without complication, without long-term current use of insulin (HCC) - Plan: Comprehensive metabolic panel,  Hemoglobin A1c, Insulin, random  Class 3 severe obesity with serious comorbidity and body mass index (BMI) of 40.0 to 44.9 in adult, unspecified obesity type (HCC)  PLAN:  Hypertension We discussed sodium restriction, working on healthy weight loss, and a regular exercise program as the means to achieve improved blood pressure control. Marieann agreed with this plan and agreed to follow up as directed. We will continue to monitor her blood pressure as well as her progress with the above lifestyle modifications. She agrees to continue her medications as prescribed and will watch for signs of hypotension as she continues her lifestyle modifications. We will recheck labs and she will follow up with our clinic in 3 weeks.  Vitamin D Deficiency Bricia was informed that low vitamin D levels contributes to fatigue and are associated with obesity, breast, and colon cancer. She agrees to continue taking prescription Vit D _0 ,000 IU every week #4 and will follow up for routine testing of vitamin D, at least 2-3 times per year. She was informed of the risk of over-replacement of vitamin D and agrees to not increase her dose unless he discusses this with Jenny Thomas first. We will recheck labs and she will follow up with our clinic in 3 weeks.  Hyperlipidemia Reily was informed of the American Heart Association Guidelines emphasizing intensive lifestyle modifications as the first line treatment for hyperlipidemia. We discussed many lifestyle modifications today in depth, and Maly will continue to work on decreasing saturated fats such as fatty red meat, butter and many fried foods. She will also increase vegetables and lean protein in her diet and continue to work on exercise and weight loss efforts. She agrees to continue taking her medications as prescribed. We will recheck labs and she will follow up with our clinic in 3 weeks.  Diabetes II Lalla has been given extensive diabetes education by myself today  including ideal fasting and post-prandial blood glucose readings, individual ideal Hgb A1c goals  and hypoglycemia prevention. We discussed the importance of good blood sugar control to decrease the likelihood of diabetic complications such as nephropathy, neuropathy, limb loss, blindness, coronary  artery disease, and death. We discussed the importance of intensive lifestyle modification including diet, exercise and weight loss as the first line treatment for diabetes. Rozalia agrees to continue her diabetes medications, we will recheck labs and she will follow up with our clinic in 3 weeks.  Hypothyroidism Drake was informed of the importance of good thyroid control to help with weight loss efforts. She was also informed that supertheraputic thyroid levels are dangerous and will not improve weight loss results. She agrees to continue taking her medications as prescribed. We will recheck labs and she will follow up with our clinic in 3 weeks.  Obesity Vergie is currently in the action stage of change. As such, her goal is to continue with weight loss efforts She has agreed to follow the Category 1 plan Daisia has been instructed to work up to a goal of 150 minutes of combined cardio and strengthening exercise per week for weight loss and overall health benefits. We discussed the following Behavioral Modification Strategies today: increasing lean protein intake and work on meal planning and easy cooking plans   Makinzi has agreed to follow up with our clinic in 3 weeks. She was informed of the importance of frequent follow up visits to maximize her success with intensive lifestyle modifications for her multiple health conditions.  I, Trixie Dredge, am acting as transcriptionist for Lacy Duverney, PA-C  I have reviewed the above documentation for accuracy and completeness, and I agree with the above. -Lacy Duverney, PA-C  I have reviewed the above note and agree with the plan. -Dennard Nip,  MD     Today's visit was # 9 out of 22.  Starting weight: 217 lbs Starting date: 05/31/17 Today's weight: 201 lbs  Today's date: 09/25/2017 Total lbs lost to date: 50 (Patients must lose 7 lbs in the first 6 months to continue with counseling)   ASK: We discussed the diagnosis of obesity with Isabeau F Wasco today and Tangi agreed to give Jenny Thomas permission to discuss obesity behavioral modification therapy today.  ASSESS: Mykhia has the diagnosis of obesity and her BMI today is 33 Opaline is in the action stage of change   ADVISE: Katharin was educated on the multiple health risks of obesity as well as the benefit of weight loss to improve her health. She was advised of the need for long term treatment and the importance of lifestyle modifications.  AGREE: Multiple dietary modification options and treatment options were discussed and  Melva agreed to follow the Category 1 plan We discussed the following Behavioral Modification Strategies today: increasing lean protein intake and work on meal planning and easy cooking plans

## 2017-09-26 LAB — HEMOGLOBIN A1C
ESTIMATED AVERAGE GLUCOSE: 114 mg/dL
HEMOGLOBIN A1C: 5.6 % (ref 4.8–5.6)

## 2017-09-26 LAB — COMPREHENSIVE METABOLIC PANEL
ALK PHOS: 98 IU/L (ref 39–117)
ALT: 14 IU/L (ref 0–32)
AST: 20 IU/L (ref 0–40)
Albumin/Globulin Ratio: 2 (ref 1.2–2.2)
Albumin: 4.5 g/dL (ref 3.6–4.8)
BUN/Creatinine Ratio: 11 — ABNORMAL LOW (ref 12–28)
BUN: 12 mg/dL (ref 8–27)
Bilirubin Total: 0.6 mg/dL (ref 0.0–1.2)
CALCIUM: 9 mg/dL (ref 8.7–10.3)
CO2: 23 mmol/L (ref 20–29)
CREATININE: 1.13 mg/dL — AB (ref 0.57–1.00)
Chloride: 105 mmol/L (ref 96–106)
GFR calc Af Amer: 58 mL/min/{1.73_m2} — ABNORMAL LOW (ref >59)
GFR calc non Af Amer: 50 mL/min/{1.73_m2} — ABNORMAL LOW (ref >59)
GLOBULIN, TOTAL: 2.2 g/dL (ref 1.5–4.5)
GLUCOSE: 88 mg/dL (ref 65–99)
Potassium: 5 mmol/L (ref 3.5–5.2)
SODIUM: 145 mmol/L — AB (ref 134–144)
Total Protein: 6.7 g/dL (ref 6.0–8.5)

## 2017-09-26 LAB — LIPID PANEL WITH LDL/HDL RATIO
Cholesterol, Total: 165 mg/dL (ref 100–199)
HDL: 59 mg/dL (ref >39)
LDL Calculated: 82 mg/dL (ref 0–99)
LDL/HDL RATIO: 1.4 ratio (ref 0.0–3.2)
TRIGLYCERIDES: 121 mg/dL (ref 0–149)
VLDL Cholesterol Cal: 24 mg/dL (ref 5–40)

## 2017-09-26 LAB — TSH: TSH: 0.672 u[IU]/mL (ref 0.450–4.500)

## 2017-09-26 LAB — INSULIN, RANDOM: INSULIN: 14.6 u[IU]/mL (ref 2.6–24.9)

## 2017-09-26 LAB — VITAMIN D 25 HYDROXY (VIT D DEFICIENCY, FRACTURES): VIT D 25 HYDROXY: 36.6 ng/mL (ref 30.0–100.0)

## 2017-09-26 LAB — T4, FREE: FREE T4: 1.59 ng/dL (ref 0.82–1.77)

## 2017-09-26 LAB — T3: T3, Total: 84 ng/dL (ref 71–180)

## 2017-09-30 DIAGNOSIS — R0602 Shortness of breath: Secondary | ICD-10-CM | POA: Diagnosis not present

## 2017-10-05 DIAGNOSIS — E1129 Type 2 diabetes mellitus with other diabetic kidney complication: Secondary | ICD-10-CM | POA: Diagnosis not present

## 2017-10-05 DIAGNOSIS — I10 Essential (primary) hypertension: Secondary | ICD-10-CM | POA: Diagnosis not present

## 2017-10-05 DIAGNOSIS — E039 Hypothyroidism, unspecified: Secondary | ICD-10-CM | POA: Diagnosis not present

## 2017-10-05 DIAGNOSIS — E7800 Pure hypercholesterolemia, unspecified: Secondary | ICD-10-CM | POA: Diagnosis not present

## 2017-10-05 DIAGNOSIS — F3340 Major depressive disorder, recurrent, in remission, unspecified: Secondary | ICD-10-CM | POA: Diagnosis not present

## 2017-10-16 ENCOUNTER — Ambulatory Visit (INDEPENDENT_AMBULATORY_CARE_PROVIDER_SITE_OTHER): Payer: Medicare HMO | Admitting: Physician Assistant

## 2017-10-16 VITALS — BP 103/71 | HR 106 | Temp 97.7°F | Ht 59.0 in | Wt 198.0 lb

## 2017-10-16 DIAGNOSIS — E1122 Type 2 diabetes mellitus with diabetic chronic kidney disease: Secondary | ICD-10-CM

## 2017-10-16 DIAGNOSIS — Z9189 Other specified personal risk factors, not elsewhere classified: Secondary | ICD-10-CM | POA: Diagnosis not present

## 2017-10-16 DIAGNOSIS — Z6841 Body Mass Index (BMI) 40.0 and over, adult: Secondary | ICD-10-CM

## 2017-10-16 DIAGNOSIS — E559 Vitamin D deficiency, unspecified: Secondary | ICD-10-CM

## 2017-10-16 MED ORDER — VITAMIN D (ERGOCALCIFEROL) 1.25 MG (50000 UNIT) PO CAPS: 50000.0000 [IU] | ORAL_CAPSULE | ORAL | 0 refills | Status: DC

## 2017-10-16 NOTE — Progress Notes (Addendum)
Office: [phone]  /  Fax: [phone]   HPI:   Chief Complaint: OBESITY Jenny Thomas is here to discuss her progress with her obesity treatment plan. She is on the Category 1 plan and is following her eating plan approximately 70 % of the time. She states she is exercising Dancercise and walking for 60 minutes 1 to 7 times per week. Jenny Thomas continues to do well with weight loss. She plans her meals well and states hunger is well controlled. She would like holiday eating strategies. Her weight is 198 lb (89.8 kg) today and has had a weight loss of 3 pounds over a period of 3 weeks since her last visit. She has lost 19 lbs since starting treatment with [name].  Vitamin D deficiency Jenny Thomas has a diagnosis of vitamin D deficiency. She is currently taking vit D and denies nausea, vomiting or muscle weakness.  Diabetes II with CKD Jenny Thomas has a diagnosis of diabetes type II. Jenny Thomas denies any hypoglycemic episodes. Her A1c is down to 5.6 and she states her PCP recommended that she stop taking Victoza. Jenny Thomas states she will stop taking Victoza and declines any other medications. She has been working on intensive lifestyle modifications including diet, exercise, and weight loss to help control her blood glucose levels.    ALLERGIES: Allergies  Allergen Reactions  . Percocet [Oxycodone-Acetaminophen]     MEDICATIONS: Current Outpatient Medications on File Prior to Visit  Medication Sig Dispense Refill  . ACCU-CHEK FASTCLIX LANCETS MISC 1 each by Does not apply route 2 (two) times daily. 102 each 0  . albuterol (ACCUNEB) 0.63 MG/3ML nebulizer solution Take 1 ampule by nebulization every 6 (six) hours as needed for wheezing.    [name] albuterol (PROVENTIL HFA;VENTOLIN HFA) 108 (90 Base) MCG/ACT inhaler Inhale into the lungs every 6 (six) hours as needed for wheezing or shortness of breath.    [name] alendronate (FOSAMAX) 70 MG tablet Take 70 mg by mouth once a week. Take with a full glass of water on an  empty stomach.    . Blood Glucose Monitoring Suppl (ACCU-CHEK NANO SMARTVIEW) w/Device KIT 1 each by Does not apply route daily. Check blood sugar twice a day. 1 kit 0  . Calcium Carb-Cholecalciferol (CALCIUM/VITAMIN D) 500-200 MG-UNIT TABS Take by mouth daily. Take one tablet by mouth once a day    . citalopram (CELEXA) 40 MG tablet Take 40 mg by mouth daily.    . Fluticasone-Salmeterol (ADVAIR) 250-50 MCG/DOSE AEPB Inhale 1 puff into the lungs 2 (two) times daily.    . furosemide (LASIX) 20 MG tablet Take 20 mg by mouth daily as needed.    [name] glucose blood test strip Check blood sugar twice a day 100 each 0  . Insulin Pen Needle (BD PEN NEEDLE NANO U/F) 32G X 4 MM MISC 1 each by Does not apply route daily. 100 each 0  . levothyroxine (SYNTHROID, LEVOTHROID) 88 MCG tablet Take 88 mcg by mouth daily before breakfast.    . losartan (COZAAR) 50 MG tablet Take 1 tablet (50 mg total) by mouth daily. 30 tablet 0  . montelukast (SINGULAIR) 10 MG tablet Take 10 mg by mouth at bedtime.    . potassium chloride SA (K-DUR,KLOR-CON) 20 MEQ tablet Take 20 mEq by mouth once.    . simvastatin (ZOCOR) 20 MG tablet Take 20 mg by mouth daily.     No current facility-administered medications on file prior to visit.     PAST MEDICAL HISTORY: Past Medical History:  Diagnosis Date  . Anxiety   . Asthma   . Back pain   . Constipation   . COPD (chronic obstructive pulmonary disease) ([place])   . Depression   . Dyspnea   . Gallbladder problem   . Hyperlipidemia   . Hypertension   . Hypothyroid   . Joint pain   . Leg edema   . Prediabetes     PAST SURGICAL HISTORY: Past Surgical History:  Procedure Laterality Date  . ABDOMINAL HYSTERECTOMY    . CHOLECYSTECTOMY    . hernia repair    . LAPAROSCOPIC GASTRIC BANDING      SOCIAL HISTORY: Social History   Tobacco Use  . Smoking status: Never Smoker  . Smokeless tobacco: Never Used  Substance Use Topics  . Alcohol use: Not on file  . Drug use: Not on  file    FAMILY HISTORY: Family History  Problem Relation Age of Onset  . Breast cancer Maternal Aunt   . Hypertension Mother   . Kidney disease Mother   . Obesity Mother   . Stroke Father   . Heart disease Father   . Alcoholism Father     ROS: Review of Systems  Constitutional: Positive for weight loss.  Gastrointestinal: Negative for nausea and vomiting.  Musculoskeletal:       Negative muscle weakness  Endo/Heme/Allergies:       Negative hypoglycemia    PHYSICAL EXAM: Blood pressure 103/71, pulse (!) 106, temperature 97.7 F (36.5 C), temperature source Oral, height 4' 11"  (1.499 m), weight 198 lb (89.8 kg), SpO2 99 %. Body mass index is 39.99 kg/m. Physical Exam  Constitutional: She is oriented to person, place, and time. She appears well-developed and well-nourished.  Cardiovascular:  Tachycardic  Pulmonary/Chest: Effort normal.  Musculoskeletal: Normal range of motion.  Neurological: She is oriented to person, place, and time.  Skin: Skin is warm and dry.  Psychiatric: She has a normal mood and affect. Her behavior is normal.  Vitals reviewed.   RECENT LABS AND TESTS: BMET    Component Value Date/Time   NA 145 (H) 09/25/2017 1135   K 5.0 09/25/2017 1135   CL 105 09/25/2017 1135   CO2 23 09/25/2017 1135   GLUCOSE 88 09/25/2017 1135   BUN 12 09/25/2017 1135   CREATININE 1.13 (H) 09/25/2017 1135   CALCIUM 9.0 09/25/2017 1135   GFRNONAA 50 (L) 09/25/2017 1135   GFRAA 58 (L) 09/25/2017 1135   Lab Results  Component Value Date   HGBA1C 5.6 09/25/2017   HGBA1C 5.7 (H) 05/31/2017   Lab Results  Component Value Date   INSULIN 14.6 09/25/2017   INSULIN 19.4 05/31/2017   CBC    Component Value Date/Time   WBC 5.4 05/31/2017 1158   RBC 4.58 05/31/2017 1158   HGB 13.9 05/31/2017 1158   HCT 41.0 05/31/2017 1158   MCV 90 05/31/2017 1158   MCH 30.3 05/31/2017 1158   MCHC 33.9 05/31/2017 1158   RDW 15.0 05/31/2017 1158   LYMPHSABS 1.8 05/31/2017 1158     EOSABS 0.1 05/31/2017 1158   BASOSABS 0.0 05/31/2017 1158   Iron/TIBC/Ferritin/ %Sat No results found for: IRON, TIBC, FERRITIN, IRONPCTSAT Lipid Panel     Component Value Date/Time   CHOL 165 09/25/2017 1135   TRIG 121 09/25/2017 1135   HDL 59 09/25/2017 1135   LDLCALC 82 09/25/2017 1135   Hepatic Function Panel     Component Value Date/Time   PROT 6.7 09/25/2017 1135   ALBUMIN 4.5 09/25/2017  1135   AST 20 09/25/2017 1135   ALT 14 09/25/2017 1135   ALKPHOS 98 09/25/2017 1135   BILITOT 0.6 09/25/2017 1135      Component Value Date/Time   TSH 0.672 09/25/2017 1135   TSH 1.110 05/31/2017 1158    ASSESSMENT AND PLAN: Vitamin D deficiency - Plan: Vitamin D, Ergocalciferol, (DRISDOL) 50000 units CAPS capsule  Type 2 diabetes mellitus with chronic kidney disease, without long-term current use of insulin, unspecified CKD stage (HCC)  Class 3 severe obesity with serious comorbidity and body mass index (BMI) of 40.0 to 44.9 in adult, unspecified obesity type (HCC)  PLAN:  Vitamin D Deficiency Jenny Thomas was informed that low vitamin D levels contributes to fatigue and are associated with obesity, breast, and colon cancer. She agrees to continue to take prescription Vit D @50 ,000 IU every week #4 with no refills and will follow up for routine testing of vitamin D, at least 2-3 times per year. She was informed of the risk of over-replacement of vitamin D and agrees to not increase her dose unless he discusses this with [name] first. Jenny Thomas agrees to follow up with our clinic in 3 weeks.  Diabetes II with CKD Jenny Thomas has been given extensive diabetes education by myself today including ideal fasting and post-prandial blood glucose readings, individual ideal Hgb A1c goals and hypoglycemia prevention. We discussed the importance of good blood sugar control to decrease the likelihood of diabetic complications such as nephropathy, neuropathy, limb loss, blindness, coronary artery disease, and  death. We discussed the importance of intensive lifestyle modification including diet, exercise and weight loss as the first line treatment for diabetes.Jenny Thomas is educated about the complications of Diabetes, including her CKD. She understands that stopping diabetes medications increases her risk for further complications. She voices understanding and states she still wants to stop taking any medicine for diabetes.   Obesity Jenny Thomas is currently in the action stage of change. As such, her goal is to continue with weight loss efforts She has agreed to follow the Category 1 plan Jenny Thomas has been instructed to work up to a goal of 150 minutes of combined cardio and strengthening exercise per week for weight loss and overall health benefits. We discussed the following Behavioral Modification Strategies today: increasing lean protein intake and holiday eating strategies   Jenny Thomas has agreed to follow up with our clinic in 3 weeks. She was informed of the importance of frequent follow up visits to maximize her success with intensive lifestyle modifications for her multiple health conditions.  I, Jenny Thomas, am acting as transcriptionist for Lacy Duverney, PA-C  I have reviewed the above documentation for accuracy and completeness, and I agree with the above. -Lacy Duverney, PA-C  I have reviewed the above note and agree with the plan. -Dennard Nip, MD   OBESITY BEHAVIORAL INTERVENTION VISIT  Today's visit was # 10 out of 22.  Starting weight: 217 lbs Starting date: 05/31/17 Today's weight : 198 lbs Today's date: 10/17/2017 Total lbs lost to date: 64 (Patients must lose 7 lbs in the first 6 months to continue with counseling)   ASK: We discussed the diagnosis of obesity with Melisia F Sirico today and Kathalene agreed to give [name] permission to discuss obesity behavioral modification therapy today.  ASSESS: Jerlean has the diagnosis of obesity and her BMI today is 39.97 Michaelina is in the  action stage of change   ADVISE: Teena was educated on the multiple health risks of obesity as well as the benefit  of weight loss to improve her health. She was advised of the need for long term treatment and the importance of lifestyle modifications.  AGREE: Multiple dietary modification options and treatment options were discussed and  Jerris agreed to follow the Category 1 plan We discussed the following Behavioral Modification Strategies today: increasing lean protein intake and holiday eating strategies

## 2017-10-27 ENCOUNTER — Other Ambulatory Visit (INDEPENDENT_AMBULATORY_CARE_PROVIDER_SITE_OTHER): Payer: Self-pay | Admitting: Family Medicine

## 2017-10-27 DIAGNOSIS — E1122 Type 2 diabetes mellitus with diabetic chronic kidney disease: Secondary | ICD-10-CM

## 2017-10-31 DIAGNOSIS — R0602 Shortness of breath: Secondary | ICD-10-CM | POA: Diagnosis not present

## 2017-11-06 ENCOUNTER — Ambulatory Visit (INDEPENDENT_AMBULATORY_CARE_PROVIDER_SITE_OTHER): Payer: Medicare HMO | Admitting: Physician Assistant

## 2017-11-06 VITALS — BP 93/59 | HR 64 | Temp 97.9°F | Ht 59.0 in | Wt 203.0 lb

## 2017-11-06 DIAGNOSIS — E559 Vitamin D deficiency, unspecified: Secondary | ICD-10-CM | POA: Diagnosis not present

## 2017-11-06 DIAGNOSIS — Z6841 Body Mass Index (BMI) 40.0 and over, adult: Secondary | ICD-10-CM | POA: Diagnosis not present

## 2017-11-06 DIAGNOSIS — Z9189 Other specified personal risk factors, not elsewhere classified: Secondary | ICD-10-CM | POA: Diagnosis not present

## 2017-11-06 DIAGNOSIS — E1129 Type 2 diabetes mellitus with other diabetic kidney complication: Secondary | ICD-10-CM

## 2017-11-06 MED ORDER — VITAMIN D (ERGOCALCIFEROL) 1.25 MG (50000 UNIT) PO CAPS: 50000.0000 [IU] | ORAL_CAPSULE | ORAL | 0 refills | Status: DC

## 2017-11-06 MED ORDER — METFORMIN HCL 500 MG PO TABS
500.0000 mg | ORAL_TABLET | Freq: Every day | ORAL | 0 refills | Status: DC
Start: 2017-11-06 — End: 2018-03-28

## 2017-11-06 NOTE — Progress Notes (Signed)
Office: [phone]  /  Fax: [phone]   HPI:   Chief Complaint: OBESITY Jenny Thomas is here to discuss her progress with her obesity treatment plan. She is on the Category 1 plan and is following her eating plan approximately 50 % of the time. She states she is exercising dance and walking for 15 minutes 5 times per week. [name] had increased celebration eating and has not been as mindful of her eating. She has also noticed an increase in hunger after stopping Victoza. Her weight is 203 lb (92.1 kg) today and has had a weight gain of 5 pounds over a period of 3 weeks since her last visit. She has lost 14 lbs since starting treatment with [name].  Vitamin D deficiency Jenny Thomas has a diagnosis of vitamin D deficiency. She is currently taking vit D and denies nausea, vomiting or muscle weakness.  Diabetes with Chronic Kidney Disease Jenny Thomas has a diagnosis of diabetes with chronic kidney disease. Jenny Thomas is not checking her BGs at home and noticed increased hunger after stopping Victoza. She denies any hypoglycemic episodes. She has been working on intensive lifestyle modifications including diet, exercise, and weight loss to help control her blood glucose levels. Jenny Thomas admits to polyphagia and requests to go back on Metformin.  ALLERGIES: Allergies  Allergen Reactions  . Percocet [Oxycodone-Acetaminophen]     MEDICATIONS: Current Outpatient Medications on File Prior to Visit  Medication Sig Dispense Refill  . ACCU-CHEK FASTCLIX LANCETS MISC 1 each by Does not apply route 2 (two) times daily. 102 each 0  . albuterol (ACCUNEB) 0.63 MG/3ML nebulizer solution Take 1 ampule by nebulization every 6 (six) hours as needed for wheezing.    [name] albuterol (PROVENTIL HFA;VENTOLIN HFA) 108 (90 Base) MCG/ACT inhaler Inhale into the lungs every 6 (six) hours as needed for wheezing or shortness of breath.    [name] alendronate (FOSAMAX) 70 MG tablet Take 70 mg by mouth once a week. Take with a full glass of  water on an empty stomach.    . Blood Glucose Monitoring Suppl (ACCU-CHEK NANO SMARTVIEW) w/Device KIT 1 each by Does not apply route daily. Check blood sugar twice a day. 1 kit 0  . Calcium Carb-Cholecalciferol (CALCIUM/VITAMIN D) 500-200 MG-UNIT TABS Take by mouth daily. Take one tablet by mouth once a day    . citalopram (CELEXA) 40 MG tablet Take 40 mg by mouth daily.    . Fluticasone-Salmeterol (ADVAIR) 250-50 MCG/DOSE AEPB Inhale 1 puff into the lungs 2 (two) times daily.    . furosemide (LASIX) 20 MG tablet Take 20 mg by mouth daily as needed.    [name] glucose blood test strip Check blood sugar twice a day 100 each 0  . Insulin Pen Needle (BD PEN NEEDLE NANO U/F) 32G X 4 MM MISC 1 each by Does not apply route daily. 100 each 0  . levothyroxine (SYNTHROID, LEVOTHROID) 88 MCG tablet Take 88 mcg by mouth daily before breakfast.    . losartan (COZAAR) 50 MG tablet Take 1 tablet (50 mg total) by mouth daily. 30 tablet 0  . montelukast (SINGULAIR) 10 MG tablet Take 10 mg by mouth at bedtime.    . potassium chloride SA (K-DUR,KLOR-CON) 20 MEQ tablet Take 20 mEq by mouth once.    . simvastatin (ZOCOR) 20 MG tablet Take 20 mg by mouth daily.    . Vitamin D, Ergocalciferol, (DRISDOL) 50000 units CAPS capsule Take 1 capsule (50,000 Units total) every 7 (seven) days by mouth. 4 capsule 0  No current facility-administered medications on file prior to visit.     PAST MEDICAL HISTORY: Past Medical History:  Diagnosis Date  . Anxiety   . Asthma   . Back pain   . Constipation   . COPD (chronic obstructive pulmonary disease) ([place])   . Depression   . Dyspnea   . Gallbladder problem   . Hyperlipidemia   . Hypertension   . Hypothyroid   . Joint pain   . Leg edema   . Prediabetes     PAST SURGICAL HISTORY: Past Surgical History:  Procedure Laterality Date  . ABDOMINAL HYSTERECTOMY    . CHOLECYSTECTOMY    . hernia repair    . LAPAROSCOPIC GASTRIC BANDING      SOCIAL HISTORY: Social  History   Tobacco Use  . Smoking status: Never Smoker  . Smokeless tobacco: Never Used  Substance Use Topics  . Alcohol use: Not on file  . Drug use: Not on file    FAMILY HISTORY: Family History  Problem Relation Age of Onset  . Breast cancer Maternal Aunt   . Hypertension Mother   . Kidney disease Mother   . Obesity Mother   . Stroke Father   . Heart disease Father   . Alcoholism Father     ROS: Review of Systems  Constitutional: Negative for weight loss.  Gastrointestinal: Negative for nausea and vomiting.  Musculoskeletal:       Negative muscle weakness  Endo/Heme/Allergies:       Positive polyphagia Negative hypoglycemia    PHYSICAL EXAM: Blood pressure (!) 93/59, pulse 64, temperature 97.9 F (36.6 C), temperature source Oral, height 4' 11"  (1.499 m), weight 203 lb (92.1 kg), SpO2 98 %. Body mass index is 41 kg/m. Physical Exam  Constitutional: She is oriented to person, place, and time. She appears well-developed and well-nourished.  Cardiovascular: Normal rate.  Pulmonary/Chest: Effort normal.  Musculoskeletal: Normal range of motion.  Neurological: She is oriented to person, place, and time.  Skin: Skin is warm and dry.  Psychiatric: She has a normal mood and affect. Her behavior is normal.  Vitals reviewed.   RECENT LABS AND TESTS: BMET    Component Value Date/Time   NA 145 (H) 09/25/2017 1135   K 5.0 09/25/2017 1135   CL 105 09/25/2017 1135   CO2 23 09/25/2017 1135   GLUCOSE 88 09/25/2017 1135   BUN 12 09/25/2017 1135   CREATININE 1.13 (H) 09/25/2017 1135   CALCIUM 9.0 09/25/2017 1135   GFRNONAA 50 (L) 09/25/2017 1135   GFRAA 58 (L) 09/25/2017 1135   Lab Results  Component Value Date   HGBA1C 5.6 09/25/2017   HGBA1C 5.7 (H) 05/31/2017   Lab Results  Component Value Date   INSULIN 14.6 09/25/2017   INSULIN 19.4 05/31/2017   CBC    Component Value Date/Time   WBC 5.4 05/31/2017 1158   RBC 4.58 05/31/2017 1158   HGB 13.9  05/31/2017 1158   HCT 41.0 05/31/2017 1158   MCV 90 05/31/2017 1158   MCH 30.3 05/31/2017 1158   MCHC 33.9 05/31/2017 1158   RDW 15.0 05/31/2017 1158   LYMPHSABS 1.8 05/31/2017 1158   EOSABS 0.1 05/31/2017 1158   BASOSABS 0.0 05/31/2017 1158   Iron/TIBC/Ferritin/ %Sat No results found for: IRON, TIBC, FERRITIN, IRONPCTSAT Lipid Panel     Component Value Date/Time   CHOL 165 09/25/2017 1135   TRIG 121 09/25/2017 1135   HDL 59 09/25/2017 1135   LDLCALC 82 09/25/2017 1135  Hepatic Function Panel     Component Value Date/Time   PROT 6.7 09/25/2017 1135   ALBUMIN 4.5 09/25/2017 1135   AST 20 09/25/2017 1135   ALT 14 09/25/2017 1135   ALKPHOS 98 09/25/2017 1135   BILITOT 0.6 09/25/2017 1135      Component Value Date/Time   TSH 0.672 09/25/2017 1135   TSH 1.110 05/31/2017 1158    ASSESSMENT AND PLAN: Vitamin D deficiency - Plan: Vitamin D, Ergocalciferol, (DRISDOL) 50000 units CAPS capsule  Type 2 diabetes mellitus with other diabetic kidney complication, without long-term current use of insulin (HCC) - Plan: metFORMIN (GLUCOPHAGE) 500 MG tablet  Class 3 severe obesity with serious comorbidity and body mass index (BMI) of 40.0 to 44.9 in adult, unspecified obesity type ([place])  PLAN:  Vitamin D Deficiency Jenny Thomas was informed that low vitamin D levels contributes to fatigue and are associated with obesity, breast, and colon cancer. She agrees to continue to take prescription Vit D @50 ,000 IU every week #4 with no refills and will follow up for routine testing of vitamin D, at least 2-3 times per year. She was informed of the risk of over-replacement of vitamin D and agrees to not increase her dose unless he discusses this with [name] first. Jenny Thomas agrees to follow up with our clinic in 2 weeks.  Diabetes with Chronic Kidney Disease Jenny Thomas has been given extensive diabetes education by myself today including ideal fasting and post-prandial blood glucose readings, individual  ideal Hgb A1c goals and hypoglycemia prevention. We discussed the importance of good blood sugar control to decrease the likelihood of diabetic complications such as nephropathy, neuropathy, limb loss, blindness, coronary artery disease, and death. We discussed the importance of intensive lifestyle modification including diet, exercise and weight loss as the first line treatment for diabetes. Bre agrees to start metformin 500 mg qd #30 with no refills and will follow up at the agreed upon time.  Obesity Jenny Thomas is currently in the action stage of change. As such, her goal is to continue with weight loss efforts She has agreed to keep a food journal with 1100 calories and 80 grams of protein daily Jenny Thomas has been instructed to work up to a goal of 150 minutes of combined cardio and strengthening exercise per week for weight loss and overall health benefits. We discussed the following Behavioral Modification Strategies today: increasing lean protein intake and planning for success  Jenny Thomas has agreed to follow up with our clinic in 2 weeks. She was informed of the importance of frequent follow up visits to maximize her success with intensive lifestyle modifications for her multiple health conditions.  I, Doreene Nest, am acting as transcriptionist for Lacy Duverney, PA-C  I have reviewed the above documentation for accuracy and completeness, and I agree with the above. -Lacy Duverney, PA-C  I have reviewed the above note and agree with the plan. -Dennard Nip, MD   OBESITY BEHAVIORAL INTERVENTION VISIT  Today's visit was # 11 out of 22.  Starting weight: 217 lbs Starting date: 05/31/17 Today's weight : 203 lbs Today's date: 11/06/2017 Total lbs lost to date: 14 (Patients must lose 7 lbs in the first 6 months to continue with counseling)   ASK: We discussed the diagnosis of obesity with Jenny Thomas today and Jenny Thomas agreed to give [name] permission to discuss obesity behavioral  modification therapy today.  ASSESS: Jenny Thomas has the diagnosis of obesity and her BMI today is 40.98 Jenny Thomas is in the action stage of change  ADVISE: Jenny Thomas was educated on the multiple health risks of obesity as well as the benefit of weight loss to improve her health. She was advised of the need for long term treatment and the importance of lifestyle modifications.  AGREE: Multiple dietary modification options and treatment options were discussed and  Jenny Thomas agreed to keep a food journal with 1100 calories and 80 grams of protein daily We discussed the following Behavioral Modification Strategies today: increasing lean protein intake and planning for success

## 2017-11-20 ENCOUNTER — Ambulatory Visit (INDEPENDENT_AMBULATORY_CARE_PROVIDER_SITE_OTHER): Payer: Self-pay | Admitting: Physician Assistant

## 2017-11-23 ENCOUNTER — Ambulatory Visit (INDEPENDENT_AMBULATORY_CARE_PROVIDER_SITE_OTHER): Payer: Medicare HMO | Admitting: Physician Assistant

## 2017-11-23 VITALS — BP 106/72 | HR 68 | Temp 97.8°F | Ht 59.0 in | Wt 203.0 lb

## 2017-11-23 DIAGNOSIS — E559 Vitamin D deficiency, unspecified: Secondary | ICD-10-CM

## 2017-11-23 DIAGNOSIS — Z6841 Body Mass Index (BMI) 40.0 and over, adult: Secondary | ICD-10-CM | POA: Diagnosis not present

## 2017-11-23 DIAGNOSIS — E1129 Type 2 diabetes mellitus with other diabetic kidney complication: Secondary | ICD-10-CM | POA: Diagnosis not present

## 2017-11-23 MED ORDER — VITAMIN D (ERGOCALCIFEROL) 1.25 MG (50000 UNIT) PO CAPS: 50000.0000 [IU] | ORAL_CAPSULE | ORAL | 0 refills | Status: DC

## 2017-11-23 NOTE — Progress Notes (Signed)
Office: [phone]  /  Fax: [phone]   HPI:   Chief Complaint: OBESITY Jenny Thomas is here to discuss her progress with her obesity treatment plan. She is on the keep a food journal with 1100 calories and 80 grams of protein daily and is following her eating plan approximately 70 % of the time. She states she is walking for 10 minutes 7 times per week. Johnny maintained her weight. Jenny Thomas is mindful of her eating and she controlled her portions. She would like more convenience meal options. Her weight is 203 lb (92.1 kg) today and has maintained her weight over a period of 2 weeks since her last visit. She has lost 14 lbs since starting treatment with [name].  Vitamin D deficiency Jenny Thomas has a diagnosis of vitamin D deficiency. She is currently taking vit D and denies nausea, vomiting or muscle weakness.  Diabetes II  Jenny Thomas has a diagnosis of diabetes type II with chronic kidney disease. Jenny Thomas states fasting BGs range between 100 and 120's and she denies any hypoglycemic episodes. She has been working on intensive lifestyle modifications including diet, exercise, and weight loss to help control her blood glucose levels.  ALLERGIES: Allergies  Allergen Reactions   Percocet [Oxycodone-Acetaminophen]     MEDICATIONS: Current Outpatient Medications on File Prior to Visit  Medication Sig Dispense Refill   ACCU-CHEK FASTCLIX LANCETS MISC 1 each by Does not apply route 2 (two) times daily. 102 each 0   albuterol (ACCUNEB) 0.63 MG/3ML nebulizer solution Take 1 ampule by nebulization every 6 (six) hours as needed for wheezing.     albuterol (PROVENTIL HFA;VENTOLIN HFA) 108 (90 Base) MCG/ACT inhaler Inhale into the lungs every 6 (six) hours as needed for wheezing or shortness of breath.     alendronate (FOSAMAX) 70 MG tablet Take 70 mg by mouth once a week. Take with a full glass of water on an empty stomach.     Blood Glucose Monitoring Suppl (ACCU-CHEK NANO SMARTVIEW) w/Device KIT  1 each by Does not apply route daily. Check blood sugar twice a day. 1 kit 0   Calcium Carb-Cholecalciferol (CALCIUM/VITAMIN D) 500-200 MG-UNIT TABS Take by mouth daily. Take one tablet by mouth once a day     citalopram (CELEXA) 40 MG tablet Take 40 mg by mouth daily.     Fluticasone-Salmeterol (ADVAIR) 250-50 MCG/DOSE AEPB Inhale 1 puff into the lungs 2 (two) times daily.     furosemide (LASIX) 20 MG tablet Take 20 mg by mouth daily as needed.     glucose blood test strip Check blood sugar twice a day 100 each 0   Insulin Pen Needle (BD PEN NEEDLE NANO U/F) 32G X 4 MM MISC 1 each by Does not apply route daily. 100 each 0   levothyroxine (SYNTHROID, LEVOTHROID) 88 MCG tablet Take 88 mcg by mouth daily before breakfast.     losartan (COZAAR) 50 MG tablet Take 1 tablet (50 mg total) by mouth daily. 30 tablet 0   metFORMIN (GLUCOPHAGE) 500 MG tablet Take 1 tablet (500 mg total) by mouth daily with breakfast. 30 tablet 0   montelukast (SINGULAIR) 10 MG tablet Take 10 mg by mouth at bedtime.     potassium chloride SA (K-DUR,KLOR-CON) 20 MEQ tablet Take 20 mEq by mouth once.     simvastatin (ZOCOR) 20 MG tablet Take 20 mg by mouth daily.     Vitamin D, Ergocalciferol, (DRISDOL) 50000 units CAPS capsule Take 1 capsule (50,000 Units total) by mouth every 7 (seven)  days. 4 capsule 0   No current facility-administered medications on file prior to visit.     PAST MEDICAL HISTORY: Past Medical History:  Diagnosis Date   Anxiety    Asthma    Back pain    Constipation    COPD (chronic obstructive pulmonary disease) (HCC)    Depression    Dyspnea    Gallbladder problem    Hyperlipidemia    Hypertension    Hypothyroid    Joint pain    Leg edema    Prediabetes     PAST SURGICAL HISTORY: Past Surgical History:  Procedure Laterality Date   ABDOMINAL HYSTERECTOMY     CHOLECYSTECTOMY     hernia repair     LAPAROSCOPIC GASTRIC BANDING      SOCIAL  HISTORY: Social History   Tobacco Use   Smoking status: Never Smoker   Smokeless tobacco: Never Used  Substance Use Topics   Alcohol use: Not on file   Drug use: Not on file    FAMILY HISTORY: Family History  Problem Relation Age of Onset   Breast cancer Maternal Aunt    Hypertension Mother    Kidney disease Mother    Obesity Mother    Stroke Father    Heart disease Father    Alcoholism Father     ROS: Review of Systems  Constitutional: Negative for weight loss.  Gastrointestinal: Negative for nausea and vomiting.  Musculoskeletal:       Negative muscle weakness  Endo/Heme/Allergies:       Negative hypoglycemia    PHYSICAL EXAM: Blood pressure 106/72, pulse 68, temperature 97.8 F (36.6 C), temperature source Oral, height 4' 11"  (1.499 m), weight 203 lb (92.1 kg), SpO2 97 %. Body mass index is 41 kg/m. Physical Exam  Constitutional: She is oriented to person, place, and time. She appears well-developed and well-nourished.  Cardiovascular: Normal rate.  Pulmonary/Chest: Effort normal.  Musculoskeletal: Normal range of motion.  Neurological: She is oriented to person, place, and time.  Skin: Skin is warm and dry.  Psychiatric: She has a normal mood and affect. Her behavior is normal.  Vitals reviewed.   RECENT LABS AND TESTS: BMET    Component Value Date/Time   NA 145 (H) 09/25/2017 1135   K 5.0 09/25/2017 1135   CL 105 09/25/2017 1135   CO2 23 09/25/2017 1135   GLUCOSE 88 09/25/2017 1135   BUN 12 09/25/2017 1135   CREATININE 1.13 (H) 09/25/2017 1135   CALCIUM 9.0 09/25/2017 1135   GFRNONAA 50 (L) 09/25/2017 1135   GFRAA 58 (L) 09/25/2017 1135   Lab Results  Component Value Date   HGBA1C 5.6 09/25/2017   HGBA1C 5.7 (H) 05/31/2017   Lab Results  Component Value Date   INSULIN 14.6 09/25/2017   INSULIN 19.4 05/31/2017   CBC    Component Value Date/Time   WBC 5.4 05/31/2017 1158   RBC 4.58 05/31/2017 1158   HGB 13.9 05/31/2017 1158    HCT 41.0 05/31/2017 1158   MCV 90 05/31/2017 1158   MCH 30.3 05/31/2017 1158   MCHC 33.9 05/31/2017 1158   RDW 15.0 05/31/2017 1158   LYMPHSABS 1.8 05/31/2017 1158   EOSABS 0.1 05/31/2017 1158   BASOSABS 0.0 05/31/2017 1158   Iron/TIBC/Ferritin/ %Sat No results found for: IRON, TIBC, FERRITIN, IRONPCTSAT Lipid Panel     Component Value Date/Time   CHOL 165 09/25/2017 1135   TRIG 121 09/25/2017 1135   HDL 59 09/25/2017 1135   LDLCALC 82 09/25/2017  1135   Hepatic Function Panel     Component Value Date/Time   PROT 6.7 09/25/2017 1135   ALBUMIN 4.5 09/25/2017 1135   AST 20 09/25/2017 1135   ALT 14 09/25/2017 1135   ALKPHOS 98 09/25/2017 1135   BILITOT 0.6 09/25/2017 1135      Component Value Date/Time   TSH 0.672 09/25/2017 1135   TSH 1.110 05/31/2017 1158    ASSESSMENT AND PLAN: Type 2 diabetes mellitus with other diabetic kidney complication, without long-term current use of insulin (HCC)  Vitamin D deficiency - Plan: Vitamin D, Ergocalciferol, (DRISDOL) 50000 units CAPS capsule  Class 3 severe obesity with serious comorbidity and body mass index (BMI) of 40.0 to 44.9 in adult, unspecified obesity type (HCC)  PLAN:  Vitamin D Deficiency Jenny Thomas was informed that low vitamin D levels contributes to fatigue and are associated with obesity, breast, and colon cancer. She agrees to continue to take prescription Vit D @50 ,000 IU every week #4 with no refills and will follow up for routine testing of vitamin D, at least 2-3 times per year. She was informed of the risk of over-replacement of vitamin D and agrees to not increase her dose unless he discusses this with [name] first. Jenny Thomas agrees to follow up with our clinic in 4 weeks.  Diabetes II  Jenny Thomas has been given extensive diabetes education by myself today including ideal fasting and post-prandial blood glucose readings, individual ideal Hgb A1c goals  and hypoglycemia prevention. We discussed the importance of good  blood sugar control to decrease the likelihood of diabetic complications such as nephropathy, neuropathy, limb loss, blindness, coronary artery disease, and death. We discussed the importance of intensive lifestyle modification including diet, exercise and weight loss as the first line treatment for diabetes. Shawnique agrees to continue her diabetes medications and will follow up at the agreed upon time.  Obesity Jenny Thomas is currently in the action stage of change. As such, her goal is to continue with weight loss efforts She has agreed to keep a food journal with 1100 calories and 80 grams of protein daily Jenny Thomas has been instructed to work up to a goal of 150 minutes of combined cardio and strengthening exercise per week for weight loss and overall health benefits. We discussed the following Behavioral Modification Strategies today: increasing lean protein intake and work on meal planning and easy cooking plans  Aevah has agreed to follow up with our clinic in 4 weeks. She was informed of the importance of frequent follow up visits to maximize her success with intensive lifestyle modifications for her multiple health conditions.  I, Doreene Nest, am acting as transcriptionist for Lacy Duverney, PA-C  I have reviewed the above documentation for accuracy and completeness, and I agree with the above. -Lacy Duverney, PA-C  I have reviewed the above note and agree with the plan. -Dennard Nip, MD   OBESITY BEHAVIORAL INTERVENTION VISIT  Today's visit was # 12 out of 22.  Starting weight: 217 lbs Starting date: 05/31/17 Today's weight : 203 lbs Today's date: 11/23/2017 Total lbs lost to date: 14 (Patients must lose 7 lbs in the first 6 months to continue with counseling)   ASK: We discussed the diagnosis of obesity with Duru F Deroo today and Marinell agreed to give [name] permission to discuss obesity behavioral modification therapy today.  ASSESS: Aisha has the diagnosis of obesity  and her BMI today is 40.98 Lizania is in the action stage of change   ADVISE: Kadyn was educated  on the multiple health risks of obesity as well as the benefit of weight loss to improve her health. She was advised of the need for long term treatment and the importance of lifestyle modifications.  AGREE: Multiple dietary modification options and treatment options were discussed and  Sherlie agreed to keep a food journal with 1100 calories and 80 grams of protein daily We discussed the following Behavioral Modification Strategies today: increasing lean protein intake and work on meal planning and easy cooking plans

## 2017-11-30 DIAGNOSIS — R0602 Shortness of breath: Secondary | ICD-10-CM | POA: Diagnosis not present

## 2017-12-19 ENCOUNTER — Ambulatory Visit (INDEPENDENT_AMBULATORY_CARE_PROVIDER_SITE_OTHER): Payer: Medicare HMO | Admitting: Physician Assistant

## 2017-12-19 VITALS — BP 122/65 | HR 76 | Temp 97.8°F | Ht 59.0 in | Wt 204.0 lb

## 2017-12-19 DIAGNOSIS — F339 Major depressive disorder, recurrent, unspecified: Secondary | ICD-10-CM | POA: Diagnosis not present

## 2017-12-19 DIAGNOSIS — E1122 Type 2 diabetes mellitus with diabetic chronic kidney disease: Secondary | ICD-10-CM

## 2017-12-19 DIAGNOSIS — Z6841 Body Mass Index (BMI) 40.0 and over, adult: Secondary | ICD-10-CM | POA: Diagnosis not present

## 2017-12-19 DIAGNOSIS — Z9189 Other specified personal risk factors, not elsewhere classified: Secondary | ICD-10-CM

## 2017-12-19 DIAGNOSIS — E119 Type 2 diabetes mellitus without complications: Secondary | ICD-10-CM | POA: Diagnosis not present

## 2017-12-19 DIAGNOSIS — E559 Vitamin D deficiency, unspecified: Secondary | ICD-10-CM | POA: Diagnosis not present

## 2017-12-19 DIAGNOSIS — J449 Chronic obstructive pulmonary disease, unspecified: Secondary | ICD-10-CM | POA: Diagnosis not present

## 2017-12-19 MED ORDER — VITAMIN D (ERGOCALCIFEROL) 1.25 MG (50000 UNIT) PO CAPS: 50000.0000 [IU] | ORAL_CAPSULE | ORAL | 0 refills | Status: DC

## 2017-12-19 NOTE — Progress Notes (Addendum)
Office: [phone]  /  Fax: [phone]   HPI:   Chief Complaint: OBESITY Jenny Thomas is here to discuss her progress with her obesity treatment plan. She is on the keep a food journal with 1100 calories and 80 grams of protein daily and is following her eating plan approximately 40 % of the time. She states she is exercising 0 minutes 0 times per week. Jenny Thomas has not been mindful of her eating after the holidays. She is motivated to get back on track and continue with weight loss. Her weight is 204 lb (92.5 kg) today and has had a weight gain of 1 pound over a period of 3 weeks since her last visit. She has lost 13 lbs since starting treatment with [name].  Vitamin D deficiency Jenny Thomas has a diagnosis of vitamin D deficiency. She is currently taking vit D and denies nausea, vomiting or muscle weakness.   Ref. Range 09/25/2017 11:35  Vitamin D, 25-Hydroxy Latest Ref Range: 30.0 - 100.0 ng/mL 36.6   At risk for osteopenia and osteoporosis Jenny Thomas is at higher risk of osteopenia and osteoporosis due to vitamin D deficiency.   Depression  Patient with a more flat affect on visit. States the holidays have been difficult for her emotionally. Relates that although she lives with her daughter, daughter's husband, and granddaughter she feels lonely in the house and her family members are not as engaging with her. She states she could live with other family members but prefers to stay where she is because she has enough personal space and can be in her "own world".  She denies any suicidal ideations, homicidal ideations, feeling unsafe,  being under any harm or danger, denies any feelings of abandonment, neglect, mental abuse, or physical abuse.  Diabetes II with diabetic kidney complications, non insulin Jenny Thomas has a diagnosis of diabetes type II. Jenny Thomas states fasting BGs range in the 110's and denies any hypoglycemic episodes. She noticed an increase in hunger after stopping victoza. She has been  working on intensive lifestyle modifications including diet, exercise, and weight loss to help control her blood glucose levels.  ALLERGIES: Allergies  Allergen Reactions  . Percocet [Oxycodone-Acetaminophen]     MEDICATIONS: Current Outpatient Medications on File Prior to Visit  Medication Sig Dispense Refill  . ACCU-CHEK FASTCLIX LANCETS MISC 1 each by Does not apply route 2 (two) times daily. 102 each 0  . albuterol (ACCUNEB) 0.63 MG/3ML nebulizer solution Take 1 ampule by nebulization every 6 (six) hours as needed for wheezing.    [name] albuterol (PROVENTIL HFA;VENTOLIN HFA) 108 (90 Base) MCG/ACT inhaler Inhale into the lungs every 6 (six) hours as needed for wheezing or shortness of breath.    [name] alendronate (FOSAMAX) 70 MG tablet Take 70 mg by mouth once a week. Take with a full glass of water on an empty stomach.    . Blood Glucose Monitoring Suppl (ACCU-CHEK NANO SMARTVIEW) w/Device KIT 1 each by Does not apply route daily. Check blood sugar twice a day. 1 kit 0  . Calcium Carb-Cholecalciferol (CALCIUM/VITAMIN D) 500-200 MG-UNIT TABS Take by mouth daily. Take one tablet by mouth once a day    . citalopram (CELEXA) 40 MG tablet Take 40 mg by mouth daily.    . Fluticasone-Salmeterol (ADVAIR) 250-50 MCG/DOSE AEPB Inhale 1 puff into the lungs 2 (two) times daily.    . furosemide (LASIX) 20 MG tablet Take 20 mg by mouth daily as needed.    [name] glucose blood test strip Check blood sugar  twice a day 100 each 0  . Insulin Pen Needle (BD PEN NEEDLE NANO U/F) 32G X 4 MM MISC 1 each by Does not apply route daily. 100 each 0  . levothyroxine (SYNTHROID, LEVOTHROID) 88 MCG tablet Take 88 mcg by mouth daily before breakfast.    . losartan (COZAAR) 50 MG tablet Take 1 tablet (50 mg total) by mouth daily. 30 tablet 0  . metFORMIN (GLUCOPHAGE) 500 MG tablet Take 1 tablet (500 mg total) by mouth daily with breakfast. 30 tablet 0  . montelukast (SINGULAIR) 10 MG tablet Take 10 mg by mouth at bedtime.    .  potassium chloride SA (K-DUR,KLOR-CON) 20 MEQ tablet Take 20 mEq by mouth once.    . simvastatin (ZOCOR) 20 MG tablet Take 20 mg by mouth daily.     No current facility-administered medications on file prior to visit.     PAST MEDICAL HISTORY: Past Medical History:  Diagnosis Date  . Anxiety   . Asthma   . Back pain   . Constipation   . COPD (chronic obstructive pulmonary disease) ([place])   . Depression   . Dyspnea   . Gallbladder problem   . Hyperlipidemia   . Hypertension   . Hypothyroid   . Joint pain   . Leg edema   . Prediabetes     PAST SURGICAL HISTORY: Past Surgical History:  Procedure Laterality Date  . ABDOMINAL HYSTERECTOMY    . CHOLECYSTECTOMY    . hernia repair    . LAPAROSCOPIC GASTRIC BANDING      SOCIAL HISTORY: Social History   Tobacco Use  . Smoking status: Never Smoker  . Smokeless tobacco: Never Used  Substance Use Topics  . Alcohol use: Not on file  . Drug use: Not on file    FAMILY HISTORY: Family History  Problem Relation Age of Onset  . Breast cancer Maternal Aunt   . Hypertension Mother   . Kidney disease Mother   . Obesity Mother   . Stroke Father   . Heart disease Father   . Alcoholism Father     ROS: Review of Systems  Constitutional: Negative for weight loss.  Gastrointestinal: Negative for nausea and vomiting.  Musculoskeletal:       Negative muscle weakness  Endo/Heme/Allergies:       Negative hypoglycemia  Psychiatric/Behavioral: Positive for depression. Negative for suicidal ideas.       Negative homicidal ideations    PHYSICAL EXAM: Blood pressure 122/65, pulse 76, temperature 97.8 F (36.6 C), temperature source Oral, height _0  (1.499 m), weight 204 lb (92.5 kg), SpO2 97 %. Body mass index is 41.2 kg/m. Physical Exam  Constitutional: She is oriented to person, place, and time. She appears well-developed and well-nourished.  No evidence of physical abuse on limited general exam  Cardiovascular: Normal  rate.  Pulmonary/Chest: Effort normal.  Musculoskeletal: Normal range of motion.  Neurological: She is oriented to person, place, and time.  Skin: Skin is warm and dry.  Psychiatric: Her behavior is normal. Judgment and thought content normal.  Flat affect  Vitals reviewed.   RECENT LABS AND TESTS: BMET    Component Value Date/Time   NA 145 (H) 09/25/2017 1135   K 5.0 09/25/2017 1135   CL 105 09/25/2017 1135   CO2 23 09/25/2017 1135   GLUCOSE 88 09/25/2017 1135   BUN 12 09/25/2017 1135   CREATININE 1.13 (H) 09/25/2017 1135   CALCIUM 9.0 09/25/2017 1135   GFRNONAA 50 (L) 09/25/2017 1135  GFRAA 58 (L) 09/25/2017 1135   Lab Results  Component Value Date   HGBA1C 5.6 09/25/2017   HGBA1C 5.7 (H) 05/31/2017   Lab Results  Component Value Date   INSULIN 14.6 09/25/2017   INSULIN 19.4 05/31/2017   CBC    Component Value Date/Time   WBC 5.4 05/31/2017 1158   RBC 4.58 05/31/2017 1158   HGB 13.9 05/31/2017 1158   HCT 41.0 05/31/2017 1158   MCV 90 05/31/2017 1158   MCH 30.3 05/31/2017 1158   MCHC 33.9 05/31/2017 1158   RDW 15.0 05/31/2017 1158   LYMPHSABS 1.8 05/31/2017 1158   EOSABS 0.1 05/31/2017 1158   BASOSABS 0.0 05/31/2017 1158   Iron/TIBC/Ferritin/ %Sat No results found for: IRON, TIBC, FERRITIN, IRONPCTSAT Lipid Panel     Component Value Date/Time   CHOL 165 09/25/2017 1135   TRIG 121 09/25/2017 1135   HDL 59 09/25/2017 1135   LDLCALC 82 09/25/2017 1135   Hepatic Function Panel     Component Value Date/Time   PROT 6.7 09/25/2017 1135   ALBUMIN 4.5 09/25/2017 1135   AST 20 09/25/2017 1135   ALT 14 09/25/2017 1135   ALKPHOS 98 09/25/2017 1135   BILITOT 0.6 09/25/2017 1135      Component Value Date/Time   TSH 0.672 09/25/2017 1135   TSH 1.110 05/31/2017 1158     Ref. Range 09/25/2017 11:35  Vitamin D, 25-Hydroxy Latest Ref Range: 30.0 - 100.0 ng/mL 36.6    ASSESSMENT AND PLAN: Vitamin D deficiency - Plan: Vitamin D, Ergocalciferol, (DRISDOL)  50000 units CAPS capsule  Type 2 diabetes mellitus with chronic kidney disease, without long-term current use of insulin, unspecified CKD stage (HCC)  At risk for osteoporosis  Class 3 severe obesity with serious comorbidity and body mass index (BMI) of 40.0 to 44.9 in adult, unspecified obesity type ([place])  PLAN:  Vitamin D Deficiency Jenny Thomas was informed that low vitamin D levels contributes to fatigue and are associated with obesity, breast, and colon cancer. She agrees to continue to take prescription Vit D _0 ,000 IU every week #4 with no refills and will follow up for routine testing of vitamin D, at least 2-3 times per year. She was informed of the risk of over-replacement of vitamin D and agrees to not increase her dose unless he discusses this with [name] first. Jenny Thomas agrees to follow up with our clinic in 1 week.  At risk for osteopenia and osteoporosis Jenny Thomas is at risk for osteopenia and osteoporosis due to her vitamin D deficiency. She was encouraged to take her vitamin D and follow her higher calcium diet and increase strengthening exercise to help strengthen her bones and decrease her risk of osteopenia and osteoporosis.  Diabetes II with diabetic kidney complications, non insulin Jenny Thomas has been given extensive diabetes education by myself today including ideal fasting and post-prandial blood glucose readings, individual ideal Hgb A1c goals  and hypoglycemia prevention. We discussed the importance of good blood sugar control to decrease the likelihood of diabetic complications such as nephropathy, neuropathy, limb loss, blindness, coronary artery disease, and death. We discussed the importance of intensive lifestyle modification including diet, exercise and weight loss as the first line treatment for diabetes. Jenny Thomas agrees to continue metformin and will follow up at the agreed upon time.  Depression Patient has been given extensive therapy by myself today about her housing  situation.  I have offered to reach out to social services for their involvement and further investigation. However, she declines that she  is under any abandonment, neglect, harm, or abuse. She has signed a waiver (scanned into the medical record as on 12/19/2017) stating that she declines the need of any help, as this is her situation her whole life and she does not feel unsafe or threatened. She declines referral to counseling stating in the past she has has not benefited from it.  Patient is instructed to go to the ER if her depression worsens, or if she develops any suicidal or homicidal ideation. She is to continue Citalopram 40 mg daily. She is instructed to call the clinic if she has any questions or concerns. She has been scheduled to follow up with me in 1 week.  Obesity Jenny Thomas is currently in the action stage of change. As such, her goal is to continue with weight loss efforts She has agreed to follow the Category 1 plan Jenny Thomas has been instructed to work up to a goal of 150 minutes of combined cardio and strengthening exercise per week for weight loss and overall health benefits. We discussed the following Behavioral Modification Strategies today: increasing lean protein intake and work on meal planning and easy cooking plans  Jenny Thomas has agreed to follow up with our clinic in 1 week. She was informed of the importance of frequent follow up visits to maximize her success with intensive lifestyle modifications for her multiple health conditions.    OBESITY BEHAVIORAL INTERVENTION VISIT  Today's visit was # 13 out of 22.  Starting weight: 217 lbs Starting date: 05/31/17 Today's weight : 204 lbs  Today's date: 12/19/2017 Total lbs lost to date: 13 (Patients must lose 7 lbs in the first 6 months to continue with counseling)   ASK: We discussed the diagnosis of obesity with Jenny Thomas today and Jenny Thomas agreed to give [name] permission to discuss obesity behavioral modification therapy  today.  ASSESS: Jenny Thomas has the diagnosis of obesity and her BMI today is 41.18 Jenny Thomas is in the action stage of change   ADVISE: Jenny Thomas was educated on the multiple health risks of obesity as well as the benefit of weight loss to improve her health. She was advised of the need for long term treatment and the importance of lifestyle modifications.  AGREE: Multiple dietary modification options and treatment options were discussed and Jenny Thomas agreed to the above obesity treatment plan.    Corey Skains, am acting as transcriptionist for Lacy Duverney, [facility] have reviewed this note and agree with the above I have reviewed the above documentation for accuracy and completeness, and I agree with the above. -Dennard Nip, MD

## 2017-12-26 ENCOUNTER — Ambulatory Visit (INDEPENDENT_AMBULATORY_CARE_PROVIDER_SITE_OTHER): Payer: Medicare HMO | Admitting: Physician Assistant

## 2017-12-26 VITALS — BP 106/69 | HR 106 | Temp 98.1°F | Ht 59.0 in | Wt 206.0 lb

## 2017-12-26 DIAGNOSIS — F3289 Other specified depressive episodes: Secondary | ICD-10-CM | POA: Diagnosis not present

## 2017-12-26 DIAGNOSIS — E559 Vitamin D deficiency, unspecified: Secondary | ICD-10-CM | POA: Diagnosis not present

## 2017-12-26 DIAGNOSIS — Z6841 Body Mass Index (BMI) 40.0 and over, adult: Secondary | ICD-10-CM

## 2017-12-26 NOTE — Progress Notes (Addendum)
Office: [phone]  /  Fax: [phone]   HPI:   Chief Complaint: OBESITY Jenny Thomas is here to discuss her progress with her obesity treatment plan. She is on the Category 1 plan and is following her eating plan approximately 80 % of the time. She states she is walking for 10 minutes 7 times per week. Jenny Thomas states she is mindful or her eating. She has deviated with eating over the holidays, but is now back on track. Her weight is 206 lb (93.4 kg) today and has had a weight gain of 2 pounds over a period of 1 week since her last visit. She has lost 11 lbs since starting treatment with [name].  Vitamin D deficiency Jenny Thomas has a diagnosis of vitamin D deficiency. She is currently taking vit D and denies nausea, vomiting or muscle weakness.   Ref. Range 09/25/2017 11:35  Vitamin D, 25-Hydroxy Latest Ref Range: 30.0 - 100.0 ng/mL 36.6   Depression with emotional eating behaviors Jenny Thomas was with a more flat affect last visit. Today, her mood is stable. She states she is a lot better that the last time and reassured me that her living arrangements are her choice and she is happy where she is. She shows no sign of suicidal or homicidal ideations.  Depression screen PHQ 2/9 05/31/2017  Decreased Interest 1  Down, Depressed, Hopeless 3  PHQ - 2 Score 4  Altered sleeping 3  Tired, decreased energy 3  Change in appetite 3  Feeling bad or failure about yourself  2  Trouble concentrating 0  Moving slowly or fidgety/restless 0  Suicidal thoughts 0  PHQ-9 Score 15      ALLERGIES: Allergies  Allergen Reactions  . Percocet [Oxycodone-Acetaminophen]     MEDICATIONS: Current Outpatient Medications on File Prior to Visit  Medication Sig Dispense Refill  . ACCU-CHEK FASTCLIX LANCETS MISC 1 each by Does not apply route 2 (two) times daily. 102 each 0  . albuterol (ACCUNEB) 0.63 MG/3ML nebulizer solution Take 1 ampule by nebulization every 6 (six) hours as needed for wheezing.    [name]  albuterol (PROVENTIL HFA;VENTOLIN HFA) 108 (90 Base) MCG/ACT inhaler Inhale into the lungs every 6 (six) hours as needed for wheezing or shortness of breath.    [name] alendronate (FOSAMAX) 70 MG tablet Take 70 mg by mouth once a week. Take with a full glass of water on an empty stomach.    . Blood Glucose Monitoring Suppl (ACCU-CHEK NANO SMARTVIEW) w/Device KIT 1 each by Does not apply route daily. Check blood sugar twice a day. 1 kit 0  . Calcium Carb-Cholecalciferol (CALCIUM/VITAMIN D) 500-200 MG-UNIT TABS Take by mouth daily. Take one tablet by mouth once a day    . citalopram (CELEXA) 40 MG tablet Take 40 mg by mouth daily.    . Fluticasone-Salmeterol (ADVAIR) 250-50 MCG/DOSE AEPB Inhale 1 puff into the lungs 2 (two) times daily.    . furosemide (LASIX) 20 MG tablet Take 20 mg by mouth daily as needed.    [name] glucose blood test strip Check blood sugar twice a day 100 each 0  . Insulin Pen Needle (BD PEN NEEDLE NANO U/F) 32G X 4 MM MISC 1 each by Does not apply route daily. 100 each 0  . levothyroxine (SYNTHROID, LEVOTHROID) 88 MCG tablet Take 88 mcg by mouth daily before breakfast.    . losartan (COZAAR) 50 MG tablet Take 1 tablet (50 mg total) by mouth daily. 30 tablet 0  . metFORMIN (GLUCOPHAGE) 500 MG  tablet Take 1 tablet (500 mg total) by mouth daily with breakfast. 30 tablet 0  . montelukast (SINGULAIR) 10 MG tablet Take 10 mg by mouth at bedtime.    . potassium chloride SA (K-DUR,KLOR-CON) 20 MEQ tablet Take 20 mEq by mouth once.    . simvastatin (ZOCOR) 20 MG tablet Take 20 mg by mouth daily.    . Vitamin D, Ergocalciferol, (DRISDOL) 50000 units CAPS capsule Take 1 capsule (50,000 Units total) by mouth every 7 (seven) days. 4 capsule 0   No current facility-administered medications on file prior to visit.     PAST MEDICAL HISTORY: Past Medical History:  Diagnosis Date  . Anxiety   . Asthma   . Back pain   . Constipation   . COPD (chronic obstructive pulmonary disease) ([place])   .  Depression   . Dyspnea   . Gallbladder problem   . Hyperlipidemia   . Hypertension   . Hypothyroid   . Joint pain   . Leg edema   . Prediabetes     PAST SURGICAL HISTORY: Past Surgical History:  Procedure Laterality Date  . ABDOMINAL HYSTERECTOMY    . CHOLECYSTECTOMY    . hernia repair    . LAPAROSCOPIC GASTRIC BANDING      SOCIAL HISTORY: Social History   Tobacco Use  . Smoking status: Never Smoker  . Smokeless tobacco: Never Used  Substance Use Topics  . Alcohol use: Not on file  . Drug use: Not on file    FAMILY HISTORY: Family History  Problem Relation Age of Onset  . Breast cancer Maternal Aunt   . Hypertension Mother   . Kidney disease Mother   . Obesity Mother   . Stroke Father   . Heart disease Father   . Alcoholism Father     ROS: Review of Systems  Constitutional: Negative for weight loss.  Gastrointestinal: Negative for nausea and vomiting.  Musculoskeletal:       Negative muscle weakness  Psychiatric/Behavioral: Positive for depression. Negative for suicidal ideas.    PHYSICAL EXAM: Blood pressure 106/69, pulse (!) 106, temperature 98.1 F (36.7 C), temperature source Oral, height 4' 11"  (1.499 m), weight 206 lb (93.4 kg), SpO2 95 %. Body mass index is 41.61 kg/m. Physical Exam  Constitutional: She is oriented to person, place, and time. She appears well-developed and well-nourished.  Cardiovascular:  tachycardic  Pulmonary/Chest: Effort normal.  Musculoskeletal: Normal range of motion.  Neurological: She is oriented to person, place, and time.  Skin: Skin is warm and dry.  Psychiatric: She has a normal mood and affect. Her behavior is normal.  Vitals reviewed.   RECENT LABS AND TESTS: BMET    Component Value Date/Time   NA 145 (H) 09/25/2017 1135   K 5.0 09/25/2017 1135   CL 105 09/25/2017 1135   CO2 23 09/25/2017 1135   GLUCOSE 88 09/25/2017 1135   BUN 12 09/25/2017 1135   CREATININE 1.13 (H) 09/25/2017 1135   CALCIUM 9.0  09/25/2017 1135   GFRNONAA 50 (L) 09/25/2017 1135   GFRAA 58 (L) 09/25/2017 1135   Lab Results  Component Value Date   HGBA1C 5.6 09/25/2017   HGBA1C 5.7 (H) 05/31/2017   Lab Results  Component Value Date   INSULIN 14.6 09/25/2017   INSULIN 19.4 05/31/2017   CBC    Component Value Date/Time   WBC 5.4 05/31/2017 1158   RBC 4.58 05/31/2017 1158   HGB 13.9 05/31/2017 1158   HCT 41.0 05/31/2017 1158  MCV 90 05/31/2017 1158   MCH 30.3 05/31/2017 1158   MCHC 33.9 05/31/2017 1158   RDW 15.0 05/31/2017 1158   LYMPHSABS 1.8 05/31/2017 1158   EOSABS 0.1 05/31/2017 1158   BASOSABS 0.0 05/31/2017 1158   Iron/TIBC/Ferritin/ %Sat No results found for: IRON, TIBC, FERRITIN, IRONPCTSAT Lipid Panel     Component Value Date/Time   CHOL 165 09/25/2017 1135   TRIG 121 09/25/2017 1135   HDL 59 09/25/2017 1135   LDLCALC 82 09/25/2017 1135   Hepatic Function Panel     Component Value Date/Time   PROT 6.7 09/25/2017 1135   ALBUMIN 4.5 09/25/2017 1135   AST 20 09/25/2017 1135   ALT 14 09/25/2017 1135   ALKPHOS 98 09/25/2017 1135   BILITOT 0.6 09/25/2017 1135      Component Value Date/Time   TSH 0.672 09/25/2017 1135   TSH 1.110 05/31/2017 1158     Ref. Range 09/25/2017 11:35  Vitamin D, 25-Hydroxy Latest Ref Range: 30.0 - 100.0 ng/mL 36.6    ASSESSMENT AND PLAN: Vitamin D deficiency  Other depression - with emotional eating  Class 3 severe obesity with serious comorbidity and body mass index (BMI) of 40.0 to 44.9 in adult, unspecified obesity type ([place])  PLAN:  Vitamin D Deficiency Jenny Thomas was informed that low vitamin D levels contributes to fatigue and are associated with obesity, breast, and colon cancer. She agrees to continue to take prescription Vit D @50 ,000 IU every week and will follow up for routine testing of vitamin D, at least 2-3 times per year. She was informed of the risk of over-replacement of vitamin D and agrees to not increase her dose unless she  discusses this with [name] first.  Depression She is to continue with Citalopram 40 mg daily and follow up with our clinic in 1 to 2 weeks.  We spent > than 50% of the 15 minute visit on the counseling as documented in the note.  Obesity Jenny Thomas is currently in the action stage of change. As such, her goal is to continue with weight loss efforts She has agreed to follow the Category 1 plan Jenny Thomas has been instructed to work up to a goal of 150 minutes of combined cardio and strengthening exercise per week for weight loss and overall health benefits We discussed the following Behavioral Modification Strategies today: increasing lean protein intake and planning for success  Jenny Thomas has agreed to follow up with our clinic in 1 to 2 weeks. She was informed of the importance of frequent follow up visits to maximize her success with intensive lifestyle modifications for her multiple health conditions.    OBESITY BEHAVIORAL INTERVENTION VISIT  Today's visit was # 14 out of 22.  Starting weight: 217 lbs Starting date: 05/31/17 Today's weight : 206 lbs Today's date: 12/26/2017 Total lbs lost to date: 11 (Patients must lose 7 lbs in the first 6 months to continue with counseling)   ASK: We discussed the diagnosis of obesity with Brynda F Galer today and Rocsi agreed to give [name] permission to discuss obesity behavioral modification therapy today.  ASSESS: Skylah has the diagnosis of obesity and her BMI today is 41.58 [place] is in the action stage of change   ADVISE: Antara was educated on the multiple health risks of obesity as well as the benefit of weight loss to improve her health. She was advised of the need for long term treatment and the importance of lifestyle modifications.  AGREE: Multiple dietary modification options and treatment options  were discussed and  Navika agreed to the above obesity treatment plan.   Corey Skains, am acting as transcriptionist for Lacy Duverney, [facility] have reviewed this note and agree with its contents  I have reviewed the above documentation for accuracy and completeness, and I agree with the above. -Dennard Nip, MD

## 2017-12-31 DIAGNOSIS — R0602 Shortness of breath: Secondary | ICD-10-CM | POA: Diagnosis not present

## 2018-01-09 ENCOUNTER — Ambulatory Visit (INDEPENDENT_AMBULATORY_CARE_PROVIDER_SITE_OTHER): Payer: Medicare HMO | Admitting: Physician Assistant

## 2018-01-09 VITALS — BP 106/69 | HR 75 | Temp 98.1°F | Ht 59.0 in | Wt 203.0 lb

## 2018-01-09 DIAGNOSIS — I10 Essential (primary) hypertension: Secondary | ICD-10-CM

## 2018-01-09 DIAGNOSIS — Z6841 Body Mass Index (BMI) 40.0 and over, adult: Secondary | ICD-10-CM

## 2018-01-09 NOTE — Progress Notes (Signed)
Office: [phone]  /  Fax: [phone]   HPI:   Chief Complaint: OBESITY Jenny Thomas is here to discuss her progress with her obesity treatment plan. She is on the Category 1 plan and is following her eating plan approximately 70 % of the time. She states she is walking and dancercising 10 to 60 minutes 6 times per week. Jenny Thomas continues to do well with weight loss. She has been following the structured meal plan. She occupies her time with knitting blankets, which she states helps her control cravings. Her weight is 203 lb (92.1 kg) today and has had a weight loss of 3 pounds over a period of 2 weeks since her last visit. She has lost 14 lbs since starting treatment with [name].  Hypertension Jenny Thomas is a 72 y.o. female with hypertension.  Jenny Thomas denies chest pain or shortness of breath on exertion. She is working weight loss to help control her blood pressure with the goal of decreasing her risk of heart attack and stroke. Jenny Thomas blood pressure is currently stable.  ALLERGIES: Allergies  Allergen Reactions  . Percocet [Oxycodone-Acetaminophen]     MEDICATIONS: Current Outpatient Medications on File Prior to Visit  Medication Sig Dispense Refill  . ACCU-CHEK FASTCLIX LANCETS MISC 1 each by Does not apply route 2 (two) times daily. 102 each 0  . albuterol (ACCUNEB) 0.63 MG/3ML nebulizer solution Take 1 ampule by nebulization every 6 (six) hours as needed for wheezing.    [name] albuterol (PROVENTIL HFA;VENTOLIN HFA) 108 (90 Base) MCG/ACT inhaler Inhale into the lungs every 6 (six) hours as needed for wheezing or shortness of breath.    [name] alendronate (FOSAMAX) 70 MG tablet Take 70 mg by mouth once a week. Take with a full glass of water on an empty stomach.    . Blood Glucose Monitoring Suppl (ACCU-CHEK NANO SMARTVIEW) w/Device KIT 1 each by Does not apply route daily. Check blood sugar twice a day. 1 kit 0  . Calcium Carb-Cholecalciferol (CALCIUM/VITAMIN D) 500-200  MG-UNIT TABS Take by mouth daily. Take one tablet by mouth once a day    . citalopram (CELEXA) 40 MG tablet Take 40 mg by mouth daily.    . Fluticasone-Salmeterol (ADVAIR) 250-50 MCG/DOSE AEPB Inhale 1 puff into the lungs 2 (two) times daily.    . furosemide (LASIX) 20 MG tablet Take 20 mg by mouth daily as needed.    [name] glucose blood test strip Check blood sugar twice a day 100 each 0  . Insulin Pen Needle (BD PEN NEEDLE NANO U/F) 32G X 4 MM MISC 1 each by Does not apply route daily. 100 each 0  . levothyroxine (SYNTHROID, LEVOTHROID) 88 MCG tablet Take 88 mcg by mouth daily before breakfast.    . losartan (COZAAR) 50 MG tablet Take 1 tablet (50 mg total) by mouth daily. 30 tablet 0  . metFORMIN (GLUCOPHAGE) 500 MG tablet Take 1 tablet (500 mg total) by mouth daily with breakfast. 30 tablet 0  . montelukast (SINGULAIR) 10 MG tablet Take 10 mg by mouth at bedtime.    . potassium chloride SA (K-DUR,KLOR-CON) 20 MEQ tablet Take 20 mEq by mouth once.    . simvastatin (ZOCOR) 20 MG tablet Take 20 mg by mouth daily.    . Vitamin D, Ergocalciferol, (DRISDOL) 50000 units CAPS capsule Take 1 capsule (50,000 Units total) by mouth every 7 (seven) days. 4 capsule 0   No current facility-administered medications on file prior to visit.  PAST MEDICAL HISTORY: Past Medical History:  Diagnosis Date  . Anxiety   . Asthma   . Back pain   . Constipation   . COPD (chronic obstructive pulmonary disease) ([place])   . Depression   . Dyspnea   . Gallbladder problem   . Hyperlipidemia   . Hypertension   . Hypothyroid   . Joint pain   . Leg edema   . Prediabetes     PAST SURGICAL HISTORY: Past Surgical History:  Procedure Laterality Date  . ABDOMINAL HYSTERECTOMY    . CHOLECYSTECTOMY    . hernia repair    . LAPAROSCOPIC GASTRIC BANDING      SOCIAL HISTORY: Social History   Tobacco Use  . Smoking status: Never Smoker  . Smokeless tobacco: Never Used  Substance Use Topics  . Alcohol use: Not  on file  . Drug use: Not on file    FAMILY HISTORY: Family History  Problem Relation Age of Onset  . Breast cancer Maternal Aunt   . Hypertension Mother   . Kidney disease Mother   . Obesity Mother   . Stroke Father   . Heart disease Father   . Alcoholism Father     ROS: Review of Systems  Constitutional: Positive for weight loss.  Respiratory: Negative for shortness of breath (on exertion).   Cardiovascular: Negative for chest pain.    PHYSICAL EXAM: Blood pressure 106/69, pulse 75, temperature 98.1 F (36.7 C), temperature source Oral, height 4' 11"  (1.499 m), weight 203 lb (92.1 kg), SpO2 96 %. Body mass index is 41 kg/m. Physical Exam  Constitutional: She is oriented to person, place, and time. She appears well-developed and well-nourished.  Cardiovascular: Normal rate.  Pulmonary/Chest: Effort normal.  Musculoskeletal: Normal range of motion.  Neurological: She is oriented to person, place, and time.  Skin: Skin is warm and dry.  Psychiatric: She has a normal mood and affect. Her behavior is normal.  Vitals reviewed.   RECENT LABS AND TESTS: BMET    Component Value Date/Time   NA 145 (H) 09/25/2017 1135   K 5.0 09/25/2017 1135   CL 105 09/25/2017 1135   CO2 23 09/25/2017 1135   GLUCOSE 88 09/25/2017 1135   BUN 12 09/25/2017 1135   CREATININE 1.13 (H) 09/25/2017 1135   CALCIUM 9.0 09/25/2017 1135   GFRNONAA 50 (L) 09/25/2017 1135   GFRAA 58 (L) 09/25/2017 1135   Lab Results  Component Value Date   HGBA1C 5.6 09/25/2017   HGBA1C 5.7 (H) 05/31/2017   Lab Results  Component Value Date   INSULIN 14.6 09/25/2017   INSULIN 19.4 05/31/2017   CBC    Component Value Date/Time   WBC 5.4 05/31/2017 1158   RBC 4.58 05/31/2017 1158   HGB 13.9 05/31/2017 1158   HCT 41.0 05/31/2017 1158   MCV 90 05/31/2017 1158   MCH 30.3 05/31/2017 1158   MCHC 33.9 05/31/2017 1158   RDW 15.0 05/31/2017 1158   LYMPHSABS 1.8 05/31/2017 1158   EOSABS 0.1 05/31/2017 1158     BASOSABS 0.0 05/31/2017 1158   Iron/TIBC/Ferritin/ %Sat No results found for: IRON, TIBC, FERRITIN, IRONPCTSAT Lipid Panel     Component Value Date/Time   CHOL 165 09/25/2017 1135   TRIG 121 09/25/2017 1135   HDL 59 09/25/2017 1135   LDLCALC 82 09/25/2017 1135   Hepatic Function Panel     Component Value Date/Time   PROT 6.7 09/25/2017 1135   ALBUMIN 4.5 09/25/2017 1135   AST 20 09/25/2017  1135   ALT 14 09/25/2017 1135   ALKPHOS 98 09/25/2017 1135   BILITOT 0.6 09/25/2017 1135      Component Value Date/Time   TSH 0.672 09/25/2017 1135   TSH 1.110 05/31/2017 1158    ASSESSMENT AND PLAN: Essential hypertension  Class 3 severe obesity with serious comorbidity and body mass index (BMI) of 40.0 to 44.9 in adult, unspecified obesity type (HCC)  PLAN:  Hypertension We discussed sodium restriction, working on healthy weight loss, and a regular exercise program as the means to achieve improved blood pressure control. Jenny Thomas agreed with this plan and agreed to follow up as directed. We will continue to monitor her blood pressure as well as her progress with the above lifestyle modifications. She will continue her medications as prescribed and will watch for signs of hypotension as she continues her lifestyle modifications.  We spent > than 50% of the 15 minute visit on the counseling as documented in the note.  Obesity Jenny Thomas is currently in the action stage of change. As such, her goal is to continue with weight loss efforts She has agreed to follow the Category 1 plan Jenny Thomas has been instructed to work up to a goal of 150 minutes of combined cardio and strengthening exercise per week for weight loss and overall health benefits. We discussed the following Behavioral Modification Strategies today: increasing lean protein intake and keeping healthy foods in the home  Mitsue has agreed to follow up with our clinic in 2 weeks. She was informed of the importance of frequent  follow up visits to maximize her success with intensive lifestyle modifications for her multiple health conditions.   OBESITY BEHAVIORAL INTERVENTION VISIT  Today's visit was # 15 out of 22.  Starting weight: 217 lbs Starting date: 05/31/17 Today's weight : 203 lbs Today's date: 01/09/2018 Total lbs lost to date: 14 (Patients must lose 7 lbs in the first 6 months to continue with counseling)   ASK: We discussed the diagnosis of obesity with Lavra F Portocarrero today and Jenny Thomas agreed to give [name] permission to discuss obesity behavioral modification therapy today.  ASSESS: Jenny Thomas has the diagnosis of obesity and her BMI today is 40.98 Elvenia is in the action stage of change   ADVISE: Sarie was educated on the multiple health risks of obesity as well as the benefit of weight loss to improve her health. She was advised of the need for long term treatment and the importance of lifestyle modifications.  AGREE: Multiple dietary modification options and treatment options were discussed and  Adrianne agreed to the above obesity treatment plan.   Corey Skains, am acting as transcriptionist for [employer], PA-C I, Lacy Duverney [facility], have reviewed this note and agree with its content.

## 2018-01-24 ENCOUNTER — Ambulatory Visit (INDEPENDENT_AMBULATORY_CARE_PROVIDER_SITE_OTHER): Payer: Medicare HMO | Admitting: Physician Assistant

## 2018-01-24 VITALS — BP 102/56 | HR 65 | Temp 98.3°F | Ht 59.0 in | Wt 203.0 lb

## 2018-01-24 DIAGNOSIS — E559 Vitamin D deficiency, unspecified: Secondary | ICD-10-CM | POA: Diagnosis not present

## 2018-01-24 DIAGNOSIS — Z6841 Body Mass Index (BMI) 40.0 and over, adult: Secondary | ICD-10-CM

## 2018-01-24 DIAGNOSIS — K5909 Other constipation: Secondary | ICD-10-CM

## 2018-01-24 MED ORDER — POLYETHYLENE GLYCOL 3350 17 GM/SCOOP PO POWD: 17.0000 g | Freq: Every day | ORAL | 0 refills | Status: DC

## 2018-01-24 MED ORDER — VITAMIN D (ERGOCALCIFEROL) 1.25 MG (50000 UNIT) PO CAPS: 50000.0000 [IU] | ORAL_CAPSULE | ORAL | 0 refills | Status: DC

## 2018-01-24 NOTE — Progress Notes (Addendum)
Office: [phone]  /  Fax: [phone]   HPI:   Chief Complaint: OBESITY Jenny Thomas is here to discuss her progress with her obesity treatment plan. She is on the Category 1 plan and is following her eating plan approximately 70 % of the time. She states she is dancing and walking for 10 minutes 7 times per week. Jenny Thomas maintained her weight. She has noticed some constipation as she has been increasing her protein intake. She states she drinks plenty of water.  Her weight is 203 lb (92.1 kg) today and has not lost weight since her last visit. She has lost 14 lbs since starting treatment with [name].  Vitamin D Deficiency Jenny Thomas has a diagnosis of vitamin D deficiency. She is currently taking prescription Vit D and denies nausea, vomiting or muscle weakness.  Constipation Jenny Thomas notes constipation for the last few weeks, worse since attempting weight loss. She states BM are less frequent and are not hard and painful. She denies hematochezia or melena. She denies drinking less H20 recently.  ALLERGIES: Allergies  Allergen Reactions  . Percocet [Oxycodone-Acetaminophen]     MEDICATIONS: Current Outpatient Medications on File Prior to Visit  Medication Sig Dispense Refill  . ACCU-CHEK FASTCLIX LANCETS MISC 1 each by Does not apply route 2 (two) times daily. 102 each 0  . albuterol (ACCUNEB) 0.63 MG/3ML nebulizer solution Take 1 ampule by nebulization every 6 (six) hours as needed for wheezing.    [name] albuterol (PROVENTIL HFA;VENTOLIN HFA) 108 (90 Base) MCG/ACT inhaler Inhale into the lungs every 6 (six) hours as needed for wheezing or shortness of breath.    [name] alendronate (FOSAMAX) 70 MG tablet Take 70 mg by mouth once a week. Take with a full glass of water on an empty stomach.    . Blood Glucose Monitoring Suppl (ACCU-CHEK NANO SMARTVIEW) w/Device KIT 1 each by Does not apply route daily. Check blood sugar twice a day. 1 kit 0  . Calcium Carb-Cholecalciferol (CALCIUM/VITAMIN D) 500-200  MG-UNIT TABS Take by mouth daily. Take one tablet by mouth once a day    . citalopram (CELEXA) 40 MG tablet Take 40 mg by mouth daily.    . Fluticasone-Salmeterol (ADVAIR) 250-50 MCG/DOSE AEPB Inhale 1 puff into the lungs 2 (two) times daily.    . furosemide (LASIX) 20 MG tablet Take 20 mg by mouth daily as needed.    [name] glucose blood test strip Check blood sugar twice a day 100 each 0  . Insulin Pen Needle (BD PEN NEEDLE NANO U/F) 32G X 4 MM MISC 1 each by Does not apply route daily. 100 each 0  . levothyroxine (SYNTHROID, LEVOTHROID) 88 MCG tablet Take 88 mcg by mouth daily before breakfast.    . losartan (COZAAR) 50 MG tablet Take 1 tablet (50 mg total) by mouth daily. 30 tablet 0  . metFORMIN (GLUCOPHAGE) 500 MG tablet Take 1 tablet (500 mg total) by mouth daily with breakfast. 30 tablet 0  . montelukast (SINGULAIR) 10 MG tablet Take 10 mg by mouth at bedtime.    . potassium chloride SA (K-DUR,KLOR-CON) 20 MEQ tablet Take 20 mEq by mouth once.    . simvastatin (ZOCOR) 20 MG tablet Take 20 mg by mouth daily.     No current facility-administered medications on file prior to visit.     PAST MEDICAL HISTORY: Past Medical History:  Diagnosis Date  . Anxiety   . Asthma   . Back pain   . Constipation   . COPD (chronic  obstructive pulmonary disease) ([place])   . Depression   . Dyspnea   . Gallbladder problem   . Hyperlipidemia   . Hypertension   . Hypothyroid   . Joint pain   . Leg edema   . Prediabetes     PAST SURGICAL HISTORY: Past Surgical History:  Procedure Laterality Date  . ABDOMINAL HYSTERECTOMY    . CHOLECYSTECTOMY    . hernia repair    . LAPAROSCOPIC GASTRIC BANDING      SOCIAL HISTORY: Social History   Tobacco Use  . Smoking status: Never Smoker  . Smokeless tobacco: Never Used  Substance Use Topics  . Alcohol use: Not on file  . Drug use: Not on file    FAMILY HISTORY: Family History  Problem Relation Age of Onset  . Breast cancer Maternal Aunt   .  Hypertension Mother   . Kidney disease Mother   . Obesity Mother   . Stroke Father   . Heart disease Father   . Alcoholism Father     ROS: Review of Systems  Constitutional: Negative for weight loss.  Gastrointestinal: Positive for constipation. Negative for melena, nausea and vomiting.       Negative hematochezia  Musculoskeletal:       Negative muscle weakness    PHYSICAL EXAM: Blood pressure (!) 102/56, pulse 65, temperature 98.3 F (36.8 C), temperature source Oral, height 4' 11"  (1.499 m), weight 203 lb (92.1 kg), SpO2 97 %. Body mass index is 41 kg/m. Physical Exam  Constitutional: She is oriented to person, place, and time. She appears well-developed and well-nourished.  Cardiovascular: Normal rate.  Pulmonary/Chest: Effort normal.  Musculoskeletal: Normal range of motion.  Neurological: She is oriented to person, place, and time.  Skin: Skin is warm and dry.  Psychiatric: She has a normal mood and affect. Her behavior is normal.  Vitals reviewed.   RECENT LABS AND TESTS: BMET    Component Value Date/Time   NA 145 (H) 09/25/2017 1135   K 5.0 09/25/2017 1135   CL 105 09/25/2017 1135   CO2 23 09/25/2017 1135   GLUCOSE 88 09/25/2017 1135   BUN 12 09/25/2017 1135   CREATININE 1.13 (H) 09/25/2017 1135   CALCIUM 9.0 09/25/2017 1135   GFRNONAA 50 (L) 09/25/2017 1135   GFRAA 58 (L) 09/25/2017 1135   Lab Results  Component Value Date   HGBA1C 5.6 09/25/2017   HGBA1C 5.7 (H) 05/31/2017   Lab Results  Component Value Date   INSULIN 14.6 09/25/2017   INSULIN 19.4 05/31/2017   CBC    Component Value Date/Time   WBC 5.4 05/31/2017 1158   RBC 4.58 05/31/2017 1158   HGB 13.9 05/31/2017 1158   HCT 41.0 05/31/2017 1158   MCV 90 05/31/2017 1158   MCH 30.3 05/31/2017 1158   MCHC 33.9 05/31/2017 1158   RDW 15.0 05/31/2017 1158   LYMPHSABS 1.8 05/31/2017 1158   EOSABS 0.1 05/31/2017 1158   BASOSABS 0.0 05/31/2017 1158   Iron/TIBC/Ferritin/ %Sat No results  found for: IRON, TIBC, FERRITIN, IRONPCTSAT Lipid Panel     Component Value Date/Time   CHOL 165 09/25/2017 1135   TRIG 121 09/25/2017 1135   HDL 59 09/25/2017 1135   LDLCALC 82 09/25/2017 1135   Hepatic Function Panel     Component Value Date/Time   PROT 6.7 09/25/2017 1135   ALBUMIN 4.5 09/25/2017 1135   AST 20 09/25/2017 1135   ALT 14 09/25/2017 1135   ALKPHOS 98 09/25/2017 1135   BILITOT  0.6 09/25/2017 1135      Component Value Date/Time   TSH 0.672 09/25/2017 1135   TSH 1.110 05/31/2017 1158  Results for TAKIYA, BELMARES (MRN [id number]) as of 01/24/2018 11:57  Ref. Range 09/25/2017 11:35  Vitamin D, 25-Hydroxy Latest Ref Range: 30.0 - 100.0 ng/mL 36.6    ASSESSMENT AND PLAN: Vitamin D deficiency - Plan: Vitamin D, Ergocalciferol, (DRISDOL) 50000 units CAPS capsule  Other constipation - Plan: polyethylene glycol powder (GLYCOLAX/MIRALAX) powder  Class 3 severe obesity with serious comorbidity and body mass index (BMI) of 40.0 to 44.9 in adult, unspecified obesity type ([place])  PLAN:  Vitamin D Deficiency Jenny Thomas was informed that low vitamin D levels contributes to fatigue and are associated with obesity, breast, and colon cancer. Jenny Thomas agrees to continue taking prescription Vit D @50 ,000 IU every week #4 and we will refill for 1 month. She will follow up for routine testing of vitamin D, at least 2-3 times per year. She was informed of the risk of over-replacement of vitamin D and agrees to not increase her dose unless she discusses this with [name] first. Jenny Thomas agrees to follow up with our clinic in 2 weeks.  Constipation Jenny Thomas was informed decrease bowel movement frequency is normal while losing weight, but stools should not be hard or painful. She was advised to increase her H20 intake and work on increasing her fiber intake. High fiber foods were discussed today. Jenny Thomas agrees to start miralax 17 g 1 capful qd with no refills. Jenny Thomas agrees to follow up with our  clinic in 2 weeks.  Obesity Jenny Thomas is currently in the action stage of change. As such, her goal is to continue with weight loss efforts She has agreed to follow the Category 1 plan Jenny Thomas has been instructed to work up to a goal of 150 minutes of combined cardio and strengthening exercise per week for weight loss and overall health benefits. We discussed the following Behavioral Modification Strategies today: increasing fiber rich foods and increase H20 intake   Jenny Thomas has agreed to follow up with our clinic in 2 weeks. She was informed of the importance of frequent follow up visits to maximize her success with intensive lifestyle modifications for her multiple health conditions.   OBESITY BEHAVIORAL INTERVENTION VISIT  Today's visit was # 16 out of 22.  Starting weight: 217 lbs Starting date: 05/31/17 Today's weight : 203 lbs Today's date: 01/24/2018 Total lbs lost to date: 14 (Patients must lose 7 lbs in the first 6 months to continue with counseling)   ASK: We discussed the diagnosis of obesity with Jenny Thomas today and Jenny Thomas agreed to give [name] permission to discuss obesity behavioral modification therapy today.  ASSESS: Jenny Thomas has the diagnosis of obesity and her BMI today is 40.98 Jenny Thomas is in the action stage of change   ADVISE: Jenny Thomas was educated on the multiple health risks of obesity as well as the benefit of weight loss to improve her health. She was advised of the need for long term treatment and the importance of lifestyle modifications.  AGREE: Multiple dietary modification options and treatment options were discussed and  Jenny Thomas agreed to the above obesity treatment plan.   Jenny Thomas, am acting as transcriptionist for Lacy Duverney, PA-C I, Lacy Duverney [facility], have reviewed this note and agree with its content

## 2018-01-31 DIAGNOSIS — R0602 Shortness of breath: Secondary | ICD-10-CM | POA: Diagnosis not present

## 2018-02-01 DIAGNOSIS — E1129 Type 2 diabetes mellitus with other diabetic kidney complication: Secondary | ICD-10-CM | POA: Diagnosis not present

## 2018-02-01 DIAGNOSIS — I10 Essential (primary) hypertension: Secondary | ICD-10-CM | POA: Diagnosis not present

## 2018-02-01 DIAGNOSIS — E039 Hypothyroidism, unspecified: Secondary | ICD-10-CM | POA: Diagnosis not present

## 2018-02-01 DIAGNOSIS — E7800 Pure hypercholesterolemia, unspecified: Secondary | ICD-10-CM | POA: Diagnosis not present

## 2018-02-01 LAB — BASIC METABOLIC PANEL
BUN: 20 (ref 4–21)
Creatinine: 1 (ref 0.5–1.1)
GLUCOSE: 87
Potassium: 4.8 (ref 3.4–5.3)
SODIUM: 140 (ref 137–147)

## 2018-02-01 LAB — LIPID PANEL
Cholesterol: 168 (ref 0–200)
HDL: 71 — AB (ref 35–70)
LDL Cholesterol: 76
Triglycerides: 106 (ref 40–160)

## 2018-02-01 LAB — HEPATIC FUNCTION PANEL
ALT: 11 (ref 7–35)
AST: 23 (ref 13–35)
BILIRUBIN, TOTAL: 0.8

## 2018-02-01 LAB — TSH: TSH: 1.09 (ref 0.41–5.90)

## 2018-02-01 LAB — HEMOGLOBIN A1C: Hemoglobin A1C: 5.7

## 2018-02-07 ENCOUNTER — Ambulatory Visit (INDEPENDENT_AMBULATORY_CARE_PROVIDER_SITE_OTHER): Payer: Medicare HMO | Admitting: Physician Assistant

## 2018-02-07 VITALS — BP 97/63 | HR 70 | Temp 98.4°F | Ht 59.0 in | Wt 204.0 lb

## 2018-02-07 DIAGNOSIS — Z6841 Body Mass Index (BMI) 40.0 and over, adult: Secondary | ICD-10-CM | POA: Diagnosis not present

## 2018-02-07 DIAGNOSIS — E039 Hypothyroidism, unspecified: Secondary | ICD-10-CM | POA: Diagnosis not present

## 2018-02-07 DIAGNOSIS — E118 Type 2 diabetes mellitus with unspecified complications: Secondary | ICD-10-CM | POA: Diagnosis not present

## 2018-02-07 DIAGNOSIS — F3340 Major depressive disorder, recurrent, in remission, unspecified: Secondary | ICD-10-CM | POA: Diagnosis not present

## 2018-02-07 DIAGNOSIS — F419 Anxiety disorder, unspecified: Secondary | ICD-10-CM | POA: Diagnosis not present

## 2018-02-07 DIAGNOSIS — I10 Essential (primary) hypertension: Secondary | ICD-10-CM | POA: Diagnosis not present

## 2018-02-07 DIAGNOSIS — E119 Type 2 diabetes mellitus without complications: Secondary | ICD-10-CM | POA: Diagnosis not present

## 2018-02-07 DIAGNOSIS — J449 Chronic obstructive pulmonary disease, unspecified: Secondary | ICD-10-CM | POA: Diagnosis not present

## 2018-02-07 DIAGNOSIS — E7800 Pure hypercholesterolemia, unspecified: Secondary | ICD-10-CM | POA: Diagnosis not present

## 2018-02-07 NOTE — Progress Notes (Addendum)
Office: [phone]  /  Fax: [phone]   HPI:   Chief Complaint: OBESITY Jenny Thomas is here to discuss her progress with her obesity treatment plan. She is on the Category 1 plan and is following her eating plan approximately 75 % of the time. She states she is exercising 0 minutes 0 times per week. Jenny Thomas is frustrated that she has gone up one pound in the weight. She states, she follows the meal plan, however, due to her financial restraints and tight budget, she is not able to keep up with all the recommended protein on the meal plan and often does not meet her protein goal. States will be meeting family next weekend and will not be able to follow the meal plan.  Her weight is 204 lb (92.5 kg) today and has had a weight loss of 1 pound over a period of 2 weeks since her last visit. She has lost 13 lbs since starting treatment with [name].   Diabetes II No complications, not on Insulin Jenny Thomas has a diagnosis of diabetes type II. Jenny Thomas states her BS at home stable, however, she has not brought in a blood sugar log for review. She has previously been on Victoza, and was taken off of it by her primary care doctor. She has recently restarted taking Metformin, after she felt the increased hunger off the Victoza. States hunger is controlled and declines any adjustment to the meformin. She denies any hypoglycemic episodes. Last A1c was 5.7 on 05/31/17.    She has been working on intensive lifestyle modifications including diet, exercise, and weight loss to help control her blood glucose levels.    ALLERGIES: Allergies  Allergen Reactions  . Percocet [Oxycodone-Acetaminophen]     MEDICATIONS: Current Outpatient Medications on File Prior to Visit  Medication Sig Dispense Refill  . ACCU-CHEK FASTCLIX LANCETS MISC 1 each by Does not apply route 2 (two) times daily. 102 each 0  . albuterol (ACCUNEB) 0.63 MG/3ML nebulizer solution Take 1 ampule by nebulization every 6 (six) hours as needed for  wheezing.    [name] albuterol (PROVENTIL HFA;VENTOLIN HFA) 108 (90 Base) MCG/ACT inhaler Inhale into the lungs every 6 (six) hours as needed for wheezing or shortness of breath.    [name] alendronate (FOSAMAX) 70 MG tablet Take 70 mg by mouth once a week. Take with a full glass of water on an empty stomach.    . Blood Glucose Monitoring Suppl (ACCU-CHEK NANO SMARTVIEW) w/Device KIT 1 each by Does not apply route daily. Check blood sugar twice a day. 1 kit 0  . Calcium Carb-Cholecalciferol (CALCIUM/VITAMIN D) 500-200 MG-UNIT TABS Take by mouth daily. Take one tablet by mouth once a day    . citalopram (CELEXA) 40 MG tablet Take 40 mg by mouth daily.    . Fluticasone-Salmeterol (ADVAIR) 250-50 MCG/DOSE AEPB Inhale 1 puff into the lungs 2 (two) times daily.    . furosemide (LASIX) 20 MG tablet Take 20 mg by mouth daily as needed.    [name] glucose blood test strip Check blood sugar twice a day 100 each 0  . Insulin Pen Needle (BD PEN NEEDLE NANO U/F) 32G X 4 MM MISC 1 each by Does not apply route daily. 100 each 0  . levothyroxine (SYNTHROID, LEVOTHROID) 88 MCG tablet Take 88 mcg by mouth daily before breakfast.    . losartan (COZAAR) 50 MG tablet Take 1 tablet (50 mg total) by mouth daily. 30 tablet 0  . metFORMIN (GLUCOPHAGE) 500 MG tablet Take 1  tablet (500 mg total) by mouth daily with breakfast. 30 tablet 0  . montelukast (SINGULAIR) 10 MG tablet Take 10 mg by mouth at bedtime.    . polyethylene glycol powder (GLYCOLAX/MIRALAX) powder Take 17 g by mouth daily. 3350 g 0  . potassium chloride SA (K-DUR,KLOR-CON) 20 MEQ tablet Take 20 mEq by mouth once.    . simvastatin (ZOCOR) 20 MG tablet Take 20 mg by mouth daily.    . Vitamin D, Ergocalciferol, (DRISDOL) 50000 units CAPS capsule Take 1 capsule (50,000 Units total) by mouth every 7 (seven) days. 4 capsule 0   No current facility-administered medications on file prior to visit.     PAST MEDICAL HISTORY: Past Medical History:  Diagnosis Date  . Anxiety     . Asthma   . Back pain   . Constipation   . COPD (chronic obstructive pulmonary disease) ([place])   . Depression   . Dyspnea   . Gallbladder problem   . Hyperlipidemia   . Hypertension   . Hypothyroid   . Joint pain   . Leg edema   . Prediabetes     PAST SURGICAL HISTORY: Past Surgical History:  Procedure Laterality Date  . ABDOMINAL HYSTERECTOMY    . CHOLECYSTECTOMY    . hernia repair    . LAPAROSCOPIC GASTRIC BANDING      SOCIAL HISTORY: Social History   Tobacco Use  . Smoking status: Never Smoker  . Smokeless tobacco: Never Used  Substance Use Topics  . Alcohol use: Not on file  . Drug use: Not on file    FAMILY HISTORY: Family History  Problem Relation Age of Onset  . Breast cancer Maternal Aunt   . Hypertension Mother   . Kidney disease Mother   . Obesity Mother   . Stroke Father   . Heart disease Father   . Alcoholism Father     ROS: Review of Systems  Constitutional: Negative for weight loss.  Respiratory: Negative for shortness of breath (on exertion).   Cardiovascular: Negative for chest pain.    PHYSICAL EXAM: Blood pressure 97/63, pulse 70, temperature 98.4 F (36.9 C), temperature source Oral, height _0  (1.499 m), weight 204 lb (92.5 kg), SpO2 98 %. Body mass index is 41.2 kg/m. Physical Exam  Constitutional: She is oriented to person, place, and time. She appears well-developed and well-nourished.  Cardiovascular: Normal rate.  Pulmonary/Chest: Effort normal.  Musculoskeletal: Normal range of motion.  Neurological: She is oriented to person, place, and time.  Skin: Skin is warm and dry.  Psychiatric: She has a normal mood and affect. Her behavior is normal.  Vitals reviewed.   RECENT LABS AND TESTS: BMET    Component Value Date/Time   NA 145 (H) 09/25/2017 1135   K 5.0 09/25/2017 1135   CL 105 09/25/2017 1135   CO2 23 09/25/2017 1135   GLUCOSE 88 09/25/2017 1135   BUN 12 09/25/2017 1135   CREATININE 1.13 (H) 09/25/2017  1135   CALCIUM 9.0 09/25/2017 1135   GFRNONAA 50 (L) 09/25/2017 1135   GFRAA 58 (L) 09/25/2017 1135   Lab Results  Component Value Date   HGBA1C 5.6 09/25/2017   HGBA1C 5.7 (H) 05/31/2017   Lab Results  Component Value Date   INSULIN 14.6 09/25/2017   INSULIN 19.4 05/31/2017   CBC    Component Value Date/Time   WBC 5.4 05/31/2017 1158   RBC 4.58 05/31/2017 1158   HGB 13.9 05/31/2017 1158   HCT 41.0 05/31/2017 1158  MCV 90 05/31/2017 1158   MCH 30.3 05/31/2017 1158   MCHC 33.9 05/31/2017 1158   RDW 15.0 05/31/2017 1158   LYMPHSABS 1.8 05/31/2017 1158   EOSABS 0.1 05/31/2017 1158   BASOSABS 0.0 05/31/2017 1158   Iron/TIBC/Ferritin/ %Sat No results found for: IRON, TIBC, FERRITIN, IRONPCTSAT Lipid Panel     Component Value Date/Time   CHOL 165 09/25/2017 1135   TRIG 121 09/25/2017 1135   HDL 59 09/25/2017 1135   LDLCALC 82 09/25/2017 1135   Hepatic Function Panel     Component Value Date/Time   PROT 6.7 09/25/2017 1135   ALBUMIN 4.5 09/25/2017 1135   AST 20 09/25/2017 1135   ALT 14 09/25/2017 1135   ALKPHOS 98 09/25/2017 1135   BILITOT 0.6 09/25/2017 1135      Component Value Date/Time   TSH 0.672 09/25/2017 1135   TSH 1.110 05/31/2017 1158    ASSESSMENT AND PLAN: Essential hypertension  Class 3 severe obesity with serious comorbidity and body mass index (BMI) of 40.0 to 44.9 in adult, unspecified obesity type (HCC)  PLAN:  Diabetes II Jenny Thomas has been given extensive diabetes education by myself today including ideal fasting and post-prandial blood glucose readings, individual ideal HgA1c goals  and hypoglycemia prevention. We discussed the importance of good blood sugar control to decrease the likelihood of diabetic complications such as nephropathy, neuropathy, limb loss, blindness, coronary artery disease, and death. We discussed the importance of intensive lifestyle modification including diet, exercise and weight loss as the first line treatment for  diabetes. Jenny Thomas agrees to continue her Metformin and will follow up at the agreed upon time. .  We spent > than 50% of the 15 minute visit on the counseling as documented in the note.  Obesity Jenny Thomas is currently in the action stage of change. As such, her goal is to continue with weight loss efforts She has agreed to portion control better and make smarter food choices, such as increase vegetables and decrease simple carbohydrates  Jenny Thomas has been instructed to work up to a goal of 150 minutes of combined cardio and strengthening exercise per week for weight loss and overall health benefits. We discussed the following Behavioral Modification Strategies today: increasing lean protein intake and work on meal planning and easy cooking plans  Jenny Thomas is advised by me, to see Dr. Leafy Ro for her next appointment.  Jenny Thomas has agreed to follow up with our clinic in 2 weeks. She was informed of the importance of frequent follow up visits to maximize her success with intensive lifestyle modifications for her multiple health conditions.   OBESITY BEHAVIORAL INTERVENTION VISIT  Today's visit was # 17 out of 22.  Starting weight: 217 lbs Starting date: 05/31/17 Today's weight : 204 lbs Today's date: 02/07/2018 Total lbs lost to date: 13 (Patients must lose 7 lbs in the first 6 months to continue with counseling)   ASK: We discussed the diagnosis of obesity with Jenny Thomas today and Jenny Thomas agreed to give [name] permission to discuss obesity behavioral modification therapy today.  ASSESS: Jenny Thomas has the diagnosis of obesity and her BMI today is 41.18 Jenny Thomas is in the action stage of change   ADVISE: Jenny Thomas was educated on the multiple health risks of obesity as well as the benefit of weight loss to improve her health. She was advised of the need for long term treatment and the importance of lifestyle modifications.  AGREE: Multiple dietary modification options and treatment  options were discussed and  Jenny Thomas  agreed to the above obesity treatment plan.   Corey Skains, am acting as transcriptionist for [employer], PA-C I, Lacy Duverney [facility], have reviewed this note and agree with its content.

## 2018-02-20 DIAGNOSIS — J449 Chronic obstructive pulmonary disease, unspecified: Secondary | ICD-10-CM | POA: Diagnosis not present

## 2018-02-20 DIAGNOSIS — J209 Acute bronchitis, unspecified: Secondary | ICD-10-CM | POA: Diagnosis not present

## 2018-02-20 DIAGNOSIS — R0609 Other forms of dyspnea: Secondary | ICD-10-CM | POA: Diagnosis not present

## 2018-02-20 DIAGNOSIS — J0110 Acute frontal sinusitis, unspecified: Secondary | ICD-10-CM | POA: Diagnosis not present

## 2018-02-21 ENCOUNTER — Ambulatory Visit (INDEPENDENT_AMBULATORY_CARE_PROVIDER_SITE_OTHER): Payer: Medicare HMO | Admitting: Family Medicine

## 2018-02-21 VITALS — BP 107/69 | HR 101 | Temp 97.9°F | Ht 59.0 in | Wt 205.0 lb

## 2018-02-21 DIAGNOSIS — Z6841 Body Mass Index (BMI) 40.0 and over, adult: Secondary | ICD-10-CM | POA: Diagnosis not present

## 2018-02-21 DIAGNOSIS — E559 Vitamin D deficiency, unspecified: Secondary | ICD-10-CM

## 2018-02-21 DIAGNOSIS — E119 Type 2 diabetes mellitus without complications: Secondary | ICD-10-CM | POA: Diagnosis not present

## 2018-02-21 MED ORDER — LIRAGLUTIDE 18 MG/3ML ~~LOC~~ SOPN: 0.6000 mg | PEN_INJECTOR | SUBCUTANEOUS | 0 refills | Status: DC

## 2018-02-21 MED ORDER — VITAMIN D (ERGOCALCIFEROL) 1.25 MG (50000 UNIT) PO CAPS: 50000.0000 [IU] | ORAL_CAPSULE | ORAL | 0 refills | Status: DC

## 2018-02-21 NOTE — Progress Notes (Signed)
Office: [phone]  /  Fax: [phone]   HPI:   Chief Complaint: OBESITY Jenny Thomas is here to discuss her progress with her obesity treatment plan. She is on the portion control better and make smarter food choices, such as increase vegetables and decrease simple carbohydrates  and is following her eating plan approximately 0 % of the time. She states she is walking for 10 minutes 5-7 times per week. Jenny Thomas has gotten off track with diet but she states she wants to get back on track now.  Her weight is 205 lb (93 kg) today and has gained 1 pound since her last visit. She has lost 12 lbs since starting treatment with [name].  Vitamin D Deficiency Jenny Thomas has a diagnosis of vitamin D deficiency. She is stable on prescription Vit D, not yet at goal. She denies nausea, vomiting or muscle weakness.  Diabetes II Jenny Thomas has a diagnosis of diabetes type II. Jenny Thomas is attempting to diet control but struggling to continue with weight loss due to polyphagia. She denies any hypoglycemic episodes. Last A1c was 5.6 on 09/25/17.  ALLERGIES: Allergies  Allergen Reactions  . Percocet [Oxycodone-Acetaminophen]     MEDICATIONS: Current Outpatient Medications on File Prior to Visit  Medication Sig Dispense Refill  . ACCU-CHEK FASTCLIX LANCETS MISC 1 each by Does not apply route 2 (two) times daily. 102 each 0  . albuterol (ACCUNEB) 0.63 MG/3ML nebulizer solution Take 1 ampule by nebulization every 6 (six) hours as needed for wheezing.    [name] albuterol (PROVENTIL HFA;VENTOLIN HFA) 108 (90 Base) MCG/ACT inhaler Inhale into the lungs every 6 (six) hours as needed for wheezing or shortness of breath.    [name] alendronate (FOSAMAX) 70 MG tablet Take 70 mg by mouth once a week. Take with a full glass of water on an empty stomach.    . Blood Glucose Monitoring Suppl (ACCU-CHEK NANO SMARTVIEW) w/Device KIT 1 each by Does not apply route daily. Check blood sugar twice a day. 1 kit 0  . Calcium Carb-Cholecalciferol  (CALCIUM/VITAMIN D) 500-200 MG-UNIT TABS Take by mouth daily. Take one tablet by mouth once a day    . citalopram (CELEXA) 40 MG tablet Take 40 mg by mouth daily.    . Fluticasone-Salmeterol (ADVAIR) 250-50 MCG/DOSE AEPB Inhale 1 puff into the lungs 2 (two) times daily.    . furosemide (LASIX) 20 MG tablet Take 20 mg by mouth daily as needed.    [name] glucose blood test strip Check blood sugar twice a day 100 each 0  . Insulin Pen Needle (BD PEN NEEDLE NANO U/F) 32G X 4 MM MISC 1 each by Does not apply route daily. 100 each 0  . levothyroxine (SYNTHROID, LEVOTHROID) 88 MCG tablet Take 88 mcg by mouth daily before breakfast.    . losartan (COZAAR) 50 MG tablet Take 1 tablet (50 mg total) by mouth daily. 30 tablet 0  . metFORMIN (GLUCOPHAGE) 500 MG tablet Take 1 tablet (500 mg total) by mouth daily with breakfast. 30 tablet 0  . montelukast (SINGULAIR) 10 MG tablet Take 10 mg by mouth at bedtime.    . polyethylene glycol powder (GLYCOLAX/MIRALAX) powder Take 17 g by mouth daily. 3350 g 0  . potassium chloride SA (K-DUR,KLOR-CON) 20 MEQ tablet Take 20 mEq by mouth once.    . simvastatin (ZOCOR) 20 MG tablet Take 20 mg by mouth daily.     No current facility-administered medications on file prior to visit.     PAST MEDICAL HISTORY: Past  Medical History:  Diagnosis Date  . Anxiety   . Asthma   . Back pain   . Constipation   . COPD (chronic obstructive pulmonary disease) ([place])   . Depression   . Dyspnea   . Gallbladder problem   . Hyperlipidemia   . Hypertension   . Hypothyroid   . Joint pain   . Leg edema   . Prediabetes     PAST SURGICAL HISTORY: Past Surgical History:  Procedure Laterality Date  . ABDOMINAL HYSTERECTOMY    . CHOLECYSTECTOMY    . hernia repair    . LAPAROSCOPIC GASTRIC BANDING      SOCIAL HISTORY: Social History   Tobacco Use  . Smoking status: Never Smoker  . Smokeless tobacco: Never Used  Substance Use Topics  . Alcohol use: Not on file  . Drug use: Not  on file    FAMILY HISTORY: Family History  Problem Relation Age of Onset  . Breast cancer Maternal Aunt   . Hypertension Mother   . Kidney disease Mother   . Obesity Mother   . Stroke Father   . Heart disease Father   . Alcoholism Father     ROS: Review of Systems  Constitutional: Negative for weight loss.  Gastrointestinal: Negative for nausea and vomiting.  Musculoskeletal:       Negative muscle weakness  Endo/Heme/Allergies:       Positive polyphagia Negative hypoglycemia    PHYSICAL EXAM: Blood pressure 107/69, pulse (!) 101, temperature 97.9 F (36.6 C), temperature source Oral, height 4' 11"  (1.499 m), weight 205 lb (93 kg), SpO2 95 %. Body mass index is 41.4 kg/m. Physical Exam  Constitutional: She is oriented to person, place, and time. She appears well-developed and well-nourished.  Cardiovascular: Normal rate.  Pulmonary/Chest: Effort normal.  Musculoskeletal: Normal range of motion.  Neurological: She is oriented to person, place, and time.  Skin: Skin is warm and dry.  Psychiatric: She has a normal mood and affect. Her behavior is normal.  Vitals reviewed.   RECENT LABS AND TESTS: BMET    Component Value Date/Time   NA 145 (H) 09/25/2017 1135   K 5.0 09/25/2017 1135   CL 105 09/25/2017 1135   CO2 23 09/25/2017 1135   GLUCOSE 88 09/25/2017 1135   BUN 12 09/25/2017 1135   CREATININE 1.13 (H) 09/25/2017 1135   CALCIUM 9.0 09/25/2017 1135   GFRNONAA 50 (L) 09/25/2017 1135   GFRAA 58 (L) 09/25/2017 1135   Lab Results  Component Value Date   HGBA1C 5.6 09/25/2017   HGBA1C 5.7 (H) 05/31/2017   Lab Results  Component Value Date   INSULIN 14.6 09/25/2017   INSULIN 19.4 05/31/2017   CBC    Component Value Date/Time   WBC 5.4 05/31/2017 1158   RBC 4.58 05/31/2017 1158   HGB 13.9 05/31/2017 1158   HCT 41.0 05/31/2017 1158   MCV 90 05/31/2017 1158   MCH 30.3 05/31/2017 1158   MCHC 33.9 05/31/2017 1158   RDW 15.0 05/31/2017 1158   LYMPHSABS  1.8 05/31/2017 1158   EOSABS 0.1 05/31/2017 1158   BASOSABS 0.0 05/31/2017 1158   Iron/TIBC/Ferritin/ %Sat No results found for: IRON, TIBC, FERRITIN, IRONPCTSAT Lipid Panel     Component Value Date/Time   CHOL 165 09/25/2017 1135   TRIG 121 09/25/2017 1135   HDL 59 09/25/2017 1135   LDLCALC 82 09/25/2017 1135   Hepatic Function Panel     Component Value Date/Time   PROT 6.7 09/25/2017 1135  ALBUMIN 4.5 09/25/2017 1135   AST 20 09/25/2017 1135   ALT 14 09/25/2017 1135   ALKPHOS 98 09/25/2017 1135   BILITOT 0.6 09/25/2017 1135      Component Value Date/Time   TSH 0.672 09/25/2017 1135   TSH 1.110 05/31/2017 1158  Results for ANIEYA, HELMAN (MRN [id number]) as of 02/21/2018 12:59  Ref. Range 09/25/2017 11:35  Vitamin D, 25-Hydroxy Latest Ref Range: 30.0 - 100.0 ng/mL 36.6    ASSESSMENT AND PLAN: Vitamin D deficiency - Plan: Vitamin D, Ergocalciferol, (DRISDOL) 50000 units CAPS capsule  Type 2 diabetes mellitus without complication, without long-term current use of insulin (HCC) - Plan: liraglutide (VICTOZA) 18 MG/3ML SOPN  Class 3 severe obesity with serious comorbidity and body mass index (BMI) of 40.0 to 44.9 in adult, unspecified obesity type ([place])  PLAN:  Vitamin D Deficiency Brooklyn was informed that low vitamin D levels contributes to fatigue and are associated with obesity, breast, and colon cancer. Eular agrees to continue taking prescription Vit D @50 ,000 IU every week #4 and we will refill for 1 month. She will follow up for routine testing of vitamin D, at least 2-3 times per year. She was informed of the risk of over-replacement of vitamin D and agrees to not increase her dose unless she discusses this with [name] first. Renny agrees to follow up with our clinic in 3 weeks.  Diabetes II Edmund has been given extensive diabetes education by myself today including ideal fasting and post-prandial blood glucose readings, individual ideal Hgb A1c goals and  hypoglycemia prevention. We discussed the importance of good blood sugar control to decrease the likelihood of diabetic complications such as nephropathy, neuropathy, limb loss, blindness, coronary artery disease, and death. We discussed the importance of intensive lifestyle modification including diet, exercise and weight loss as the first line treatment for diabetes. Abra agrees to restart Victoza 0.6 mg q AM #1 pen with no refills. Britton agrees to follow up with our clinic in 3 weeks.  Obesity Amelianna is currently in the action stage of change. As such, her goal is to continue with weight loss efforts She has agreed to keep a food journal with 1000 calories and 80+ grams of protein daily Nyimah has been instructed to work up to a goal of 150 minutes of combined cardio and strengthening exercise per week for weight loss and overall health benefits. We discussed the following Behavioral Modification Strategies today: increasing lean protein intake, decreasing simple carbohydrates, and keep a strict food journal    Narelle has agreed to follow up with our clinic in 3 weeks. She was informed of the importance of frequent follow up visits to maximize her success with intensive lifestyle modifications for her multiple health conditions.   OBESITY BEHAVIORAL INTERVENTION VISIT  Today's visit was # 18 out of 22.  Starting weight: 217 lbs Starting date: 05/31/17 Today's weight : 205 lbs  Today's date: 02/21/2018 Total lbs lost to date: 12 (Patients must lose 7 lbs in the first 6 months to continue with counseling)   ASK: We discussed the diagnosis of obesity with Daquana F Erbe today and Jonni agreed to give [name] permission to discuss obesity behavioral modification therapy today.  ASSESS: Kalesha has the diagnosis of obesity and her BMI today is 41.38 Sharlynn is in the action stage of change   ADVISE: Haiden was educated on the multiple health risks of obesity as well as the  benefit of weight loss to improve her health. She was advised  of the need for long term treatment and the importance of lifestyle modifications.  AGREE: Multiple dietary modification options and treatment options were discussed and  Kortlyn agreed to the above obesity treatment plan.  I, Trixie Dredge, am acting as transcriptionist for Dennard Nip, MD  I have reviewed the above documentation for accuracy and completeness, and I agree with the above. -Dennard Nip, MD

## 2018-02-28 DIAGNOSIS — R0602 Shortness of breath: Secondary | ICD-10-CM | POA: Diagnosis not present

## 2018-03-14 ENCOUNTER — Ambulatory Visit (INDEPENDENT_AMBULATORY_CARE_PROVIDER_SITE_OTHER): Payer: Medicare HMO | Admitting: Family Medicine

## 2018-03-14 VITALS — BP 115/77 | HR 104 | Temp 98.2°F | Ht 59.0 in | Wt 205.0 lb

## 2018-03-14 DIAGNOSIS — E119 Type 2 diabetes mellitus without complications: Secondary | ICD-10-CM | POA: Diagnosis not present

## 2018-03-14 DIAGNOSIS — Z6841 Body Mass Index (BMI) 40.0 and over, adult: Secondary | ICD-10-CM | POA: Diagnosis not present

## 2018-03-14 DIAGNOSIS — E559 Vitamin D deficiency, unspecified: Secondary | ICD-10-CM | POA: Diagnosis not present

## 2018-03-14 MED ORDER — LIRAGLUTIDE 18 MG/3ML ~~LOC~~ SOPN: 0.6000 mg | PEN_INJECTOR | SUBCUTANEOUS | 0 refills | Status: DC

## 2018-03-14 MED ORDER — VITAMIN D (ERGOCALCIFEROL) 1.25 MG (50000 UNIT) PO CAPS: 50000.0000 [IU] | ORAL_CAPSULE | ORAL | 0 refills | Status: DC

## 2018-03-14 NOTE — Progress Notes (Signed)
Office: [phone]  /  Fax: [phone]   HPI:   Chief Complaint: OBESITY Jenny Thomas is here to discuss her progress with her obesity treatment plan. She is on the keep a food journal with 1000 calories and 80+ grams of protein daily and is following her eating plan approximately 70 % of the time. She states she is doing dancercise for 60 minutes 1 times per week. Jenny Thomas has done well maintaining weight over last 2 weeks but not journaling as well. She would like to discuss other options. She is status post gastric bypass.  Her weight is 205 lb (93 kg) today and has not lost weight since her last visit. She has lost 12 lbs since starting treatment with [name].  Diabetes II Jenny Thomas has a diagnosis of diabetes type II. Last A1c well controlled at 5.6 on Victoza and metformin. She denies nausea, vomiting, or hypoglycemia.  She has been working on intensive lifestyle modifications including diet, exercise, and weight loss to help control her blood glucose levels.  Vitamin D Deficiency Jenny Thomas has a diagnosis of vitamin D deficiency. She is stable on prescription Vit D, not yet at goal. She denies nausea, vomiting or muscle weakness.  ALLERGIES: Allergies  Allergen Reactions  . Percocet [Oxycodone-Acetaminophen]     MEDICATIONS: Current Outpatient Medications on File Prior to Visit  Medication Sig Dispense Refill  . ACCU-CHEK FASTCLIX LANCETS MISC 1 each by Does not apply route 2 (two) times daily. 102 each 0  . albuterol (ACCUNEB) 0.63 MG/3ML nebulizer solution Take 1 ampule by nebulization every 6 (six) hours as needed for wheezing.    [name] albuterol (PROVENTIL HFA;VENTOLIN HFA) 108 (90 Base) MCG/ACT inhaler Inhale into the lungs every 6 (six) hours as needed for wheezing or shortness of breath.    [name] alendronate (FOSAMAX) 70 MG tablet Take 70 mg by mouth once a week. Take with a full glass of water on an empty stomach.    . Blood Glucose Monitoring Suppl (ACCU-CHEK NANO SMARTVIEW) w/Device  KIT 1 each by Does not apply route daily. Check blood sugar twice a day. 1 kit 0  . Calcium Carb-Cholecalciferol (CALCIUM/VITAMIN D) 500-200 MG-UNIT TABS Take by mouth daily. Take one tablet by mouth once a day    . citalopram (CELEXA) 40 MG tablet Take 40 mg by mouth daily.    . Fluticasone-Salmeterol (ADVAIR) 250-50 MCG/DOSE AEPB Inhale 1 puff into the lungs 2 (two) times daily.    . furosemide (LASIX) 20 MG tablet Take 20 mg by mouth daily as needed.    [name] glucose blood test strip Check blood sugar twice a day 100 each 0  . Insulin Pen Needle (BD PEN NEEDLE NANO U/F) 32G X 4 MM MISC 1 each by Does not apply route daily. 100 each 0  . levothyroxine (SYNTHROID, LEVOTHROID) 88 MCG tablet Take 88 mcg by mouth daily before breakfast.    . losartan (COZAAR) 50 MG tablet Take 1 tablet (50 mg total) by mouth daily. 30 tablet 0  . metFORMIN (GLUCOPHAGE) 500 MG tablet Take 1 tablet (500 mg total) by mouth daily with breakfast. 30 tablet 0  . montelukast (SINGULAIR) 10 MG tablet Take 10 mg by mouth at bedtime.    . polyethylene glycol powder (GLYCOLAX/MIRALAX) powder Take 17 g by mouth daily. 3350 g 0  . potassium chloride SA (K-DUR,KLOR-CON) 20 MEQ tablet Take 20 mEq by mouth once.    . simvastatin (ZOCOR) 20 MG tablet Take 20 mg by mouth daily.  No current facility-administered medications on file prior to visit.     PAST MEDICAL HISTORY: Past Medical History:  Diagnosis Date  . Anxiety   . Asthma   . Back pain   . Constipation   . COPD (chronic obstructive pulmonary disease) ([place])   . Depression   . Dyspnea   . Gallbladder problem   . Hyperlipidemia   . Hypertension   . Hypothyroid   . Joint pain   . Leg edema   . Prediabetes     PAST SURGICAL HISTORY: Past Surgical History:  Procedure Laterality Date  . ABDOMINAL HYSTERECTOMY    . CHOLECYSTECTOMY    . hernia repair    . LAPAROSCOPIC GASTRIC BANDING      SOCIAL HISTORY: Social History   Tobacco Use  . Smoking status:  Never Smoker  . Smokeless tobacco: Never Used  Substance Use Topics  . Alcohol use: Not on file  . Drug use: Not on file    FAMILY HISTORY: Family History  Problem Relation Age of Onset  . Breast cancer Maternal Aunt   . Hypertension Mother   . Kidney disease Mother   . Obesity Mother   . Stroke Father   . Heart disease Father   . Alcoholism Father     ROS: Review of Systems  Constitutional: Negative for weight loss.  Gastrointestinal: Negative for nausea and vomiting.  Musculoskeletal:       Negative muscle weakness  Endo/Heme/Allergies:       Negative hypoglycemia    PHYSICAL EXAM: Blood pressure 115/77, pulse (!) 104, temperature 98.2 F (36.8 C), temperature source Oral, height '4\' 11"'$  (1.499 m), weight 205 lb (93 kg), SpO2 96 %. Body mass index is 41.4 kg/m. Physical Exam  Constitutional: She is oriented to person, place, and time. She appears well-developed and well-nourished.  Cardiovascular: Normal rate.  Pulmonary/Chest: Effort normal.  Musculoskeletal: Normal range of motion.  Neurological: She is oriented to person, place, and time.  Skin: Skin is warm and dry.  Psychiatric: She has a normal mood and affect. Her behavior is normal.  Vitals reviewed.   RECENT LABS AND TESTS: BMET    Component Value Date/Time   NA 145 (H) 09/25/2017 1135   K 5.0 09/25/2017 1135   CL 105 09/25/2017 1135   CO2 23 09/25/2017 1135   GLUCOSE 88 09/25/2017 1135   BUN 12 09/25/2017 1135   CREATININE 1.13 (H) 09/25/2017 1135   CALCIUM 9.0 09/25/2017 1135   GFRNONAA 50 (L) 09/25/2017 1135   GFRAA 58 (L) 09/25/2017 1135   Lab Results  Component Value Date   HGBA1C 5.6 09/25/2017   HGBA1C 5.7 (H) 05/31/2017   Lab Results  Component Value Date   INSULIN 14.6 09/25/2017   INSULIN 19.4 05/31/2017   CBC    Component Value Date/Time   WBC 5.4 05/31/2017 1158   RBC 4.58 05/31/2017 1158   HGB 13.9 05/31/2017 1158   HCT 41.0 05/31/2017 1158   MCV 90 05/31/2017 1158    MCH 30.3 05/31/2017 1158   MCHC 33.9 05/31/2017 1158   RDW 15.0 05/31/2017 1158   LYMPHSABS 1.8 05/31/2017 1158   EOSABS 0.1 05/31/2017 1158   BASOSABS 0.0 05/31/2017 1158   Iron/TIBC/Ferritin/ %Sat No results found for: IRON, TIBC, FERRITIN, IRONPCTSAT Lipid Panel     Component Value Date/Time   CHOL 165 09/25/2017 1135   TRIG 121 09/25/2017 1135   HDL 59 09/25/2017 1135   LDLCALC 82 09/25/2017 1135   Hepatic Function  Panel     Component Value Date/Time   PROT 6.7 09/25/2017 1135   ALBUMIN 4.5 09/25/2017 1135   AST 20 09/25/2017 1135   ALT 14 09/25/2017 1135   ALKPHOS 98 09/25/2017 1135   BILITOT 0.6 09/25/2017 1135      Component Value Date/Time   TSH 0.672 09/25/2017 1135   TSH 1.110 05/31/2017 1158  Results for KYLINN, SHROPSHIRE (MRN [id number]) as of 03/14/2018 17:35  Ref. Range 09/25/2017 11:35  Vitamin D, 25-Hydroxy Latest Ref Range: 30.0 - 100.0 ng/mL 36.6    ASSESSMENT AND PLAN: Type 2 diabetes mellitus without complication, without long-term current use of insulin (HCC) - Plan: liraglutide (VICTOZA) 18 MG/3ML SOPN  Vitamin D deficiency - Plan: Vitamin D, Ergocalciferol, (DRISDOL) 50000 units CAPS capsule  Class 3 severe obesity with serious comorbidity and body mass index (BMI) of 40.0 to 44.9 in adult, unspecified obesity type ([place])  PLAN:  Diabetes II Jenny Thomas has been given extensive diabetes education by myself today including ideal fasting and post-prandial blood glucose readings, individual ideal Hgb A1c goals and hypoglycemia prevention. We discussed the importance of good blood sugar control to decrease the likelihood of diabetic complications such as nephropathy, neuropathy, limb loss, blindness, coronary artery disease, and death. We discussed the importance of intensive lifestyle modification including diet, exercise and weight loss as the first line treatment for diabetes. Jenny Thomas agrees to continue Victoza 0.6 mg # 3 pen box and we will refill for 3  months. Jenny Thomas agrees to follow up with our clinic in 2 to 3 weeks.  Vitamin D Deficiency Jenny Thomas was informed that low vitamin D levels contributes to fatigue and are associated with obesity, breast, and colon cancer. Jenny Thomas agrees to continue taking prescription Vit D _0 ,000 IU every week #4 and we will refill for 1 month. She will follow up for routine testing of vitamin D, at least 2-3 times per year. She was informed of the risk of over-replacement of vitamin D and agrees to not increase her dose unless she discusses this with [name] first. Jenny Thomas agrees to follow up with our clinic in 2 to 3 weeks.  Obesity Jenny Thomas is currently in the action stage of change. As such, her goal is to continue with weight loss efforts She has agreed to change to follow the Pescatarian eating plan Jenny Thomas has been instructed to work up to a goal of 150 minutes of combined cardio and strengthening exercise per week for weight loss and overall health benefits. We discussed the following Behavioral Modification Strategies today: increasing lean protein intake, decreasing simple carbohydrates, increasing vegetables and work on meal planning and easy cooking plans   Jenny Thomas has agreed to follow up with our clinic in 2 to 3 weeks. She was informed of the importance of frequent follow up visits to maximize her success with intensive lifestyle modifications for her multiple health conditions.   OBESITY BEHAVIORAL INTERVENTION VISIT  Today's visit was # 19 out of 22.  Starting weight: 217 lbs Starting date: 05/31/17 Today's weight : 205 lbs Today's date: 03/14/2018 Total lbs lost to date: 12 (Patients must lose 7 lbs in the first 6 months to continue with counseling)   ASK: We discussed the diagnosis of obesity with Jenny Thomas today and Jenny Thomas agreed to give [name] permission to discuss obesity behavioral modification therapy today.  ASSESS: Jenny Thomas has the diagnosis of obesity and her BMI today is  41.38 Jenny Thomas is in the action stage of change   ADVISE: Jenny Thomas was  educated on the multiple health risks of obesity as well as the benefit of weight loss to improve her health. She was advised of the need for long term treatment and the importance of lifestyle modifications.  AGREE: Multiple dietary modification options and treatment options were discussed and  Jenny Thomas agreed to the above obesity treatment plan.  I, Trixie Dredge, am acting as transcriptionist for Dennard Nip, MD  I have reviewed the above documentation for accuracy and completeness, and I agree with the above. -Dennard Nip, MD

## 2018-03-15 ENCOUNTER — Other Ambulatory Visit: Payer: Self-pay | Admitting: Family Medicine

## 2018-03-15 DIAGNOSIS — Z1231 Encounter for screening mammogram for malignant neoplasm of breast: Secondary | ICD-10-CM

## 2018-03-26 LAB — HM DIABETES EYE EXAM

## 2018-03-27 DIAGNOSIS — H524 Presbyopia: Secondary | ICD-10-CM | POA: Diagnosis not present

## 2018-03-28 ENCOUNTER — Ambulatory Visit (INDEPENDENT_AMBULATORY_CARE_PROVIDER_SITE_OTHER): Payer: Medicare HMO | Admitting: Family Medicine

## 2018-03-28 VITALS — BP 103/56 | HR 71 | Temp 97.9°F | Ht 59.0 in | Wt 207.0 lb

## 2018-03-28 DIAGNOSIS — Z6841 Body Mass Index (BMI) 40.0 and over, adult: Secondary | ICD-10-CM

## 2018-03-28 DIAGNOSIS — E119 Type 2 diabetes mellitus without complications: Secondary | ICD-10-CM | POA: Diagnosis not present

## 2018-03-28 MED ORDER — LIRAGLUTIDE 18 MG/3ML ~~LOC~~ SOPN: 0.6000 mg | PEN_INJECTOR | SUBCUTANEOUS | 0 refills | Status: DC

## 2018-03-28 NOTE — Progress Notes (Signed)
Office: [phone]  /  Fax: [phone]   HPI:   Chief Complaint: OBESITY Jenny Thomas is here to discuss her progress with her obesity treatment plan. She is on the Pescatarian eating plan and is following her eating plan approximately 80 % of the time. She states she is doing dancercise and walking for 10 to 60 minutes 3 to 4 times per week. Jenny Thomas is retaining some fluid today. She has worked hard to follow her plan closely. She is doing some cardio exercise, but not much strengthening. Her weight is 207 lb (93.9 kg) today and has had a weight gain of 2 pounds over a period of 2 weeks since her last visit. She has lost 10 lbs since starting treatment with [name].  Diabetes II Jenny Thomas has a diagnosis of diabetes type II. She is doing well with Victoza and diet prescription. She didn't bring in her blood sugar log today. Jenny Thomas denies any hypoglycemic episodes. Last A1c was well controlled at 5.6 She has been working on intensive lifestyle modifications including diet, exercise, and weight loss to help control her blood glucose levels.  ALLERGIES: Allergies  Allergen Reactions  . Percocet [Oxycodone-Acetaminophen]     MEDICATIONS: Current Outpatient Medications on File Prior to Visit  Medication Sig Dispense Refill  . ACCU-CHEK FASTCLIX LANCETS MISC 1 each by Does not apply route 2 (two) times daily. 102 each 0  . albuterol (ACCUNEB) 0.63 MG/3ML nebulizer solution Take 1 ampule by nebulization every 6 (six) hours as needed for wheezing.    [name] albuterol (PROVENTIL HFA;VENTOLIN HFA) 108 (90 Base) MCG/ACT inhaler Inhale into the lungs every 6 (six) hours as needed for wheezing or shortness of breath.    [name] alendronate (FOSAMAX) 70 MG tablet Take 70 mg by mouth once a week. Take with a full glass of water on an empty stomach.    . Blood Glucose Monitoring Suppl (ACCU-CHEK NANO SMARTVIEW) w/Device KIT 1 each by Does not apply route daily. Check blood sugar twice a day. 1 kit 0  . Calcium  Carb-Cholecalciferol (CALCIUM/VITAMIN D) 500-200 MG-UNIT TABS Take by mouth daily. Take one tablet by mouth once a day    . citalopram (CELEXA) 40 MG tablet Take 40 mg by mouth daily.    . Fluticasone-Salmeterol (ADVAIR) 250-50 MCG/DOSE AEPB Inhale 1 puff into the lungs 2 (two) times daily.    . furosemide (LASIX) 20 MG tablet Take 20 mg by mouth daily as needed.    [name] glucose blood test strip Check blood sugar twice a day 100 each 0  . Insulin Pen Needle (BD PEN NEEDLE NANO U/F) 32G X 4 MM MISC 1 each by Does not apply route daily. 100 each 0  . levothyroxine (SYNTHROID, LEVOTHROID) 88 MCG tablet Take 88 mcg by mouth daily before breakfast.    . losartan (COZAAR) 50 MG tablet Take 1 tablet (50 mg total) by mouth daily. 30 tablet 0  . montelukast (SINGULAIR) 10 MG tablet Take 10 mg by mouth at bedtime.    . polyethylene glycol powder (GLYCOLAX/MIRALAX) powder Take 17 g by mouth daily. 3350 g 0  . potassium chloride SA (K-DUR,KLOR-CON) 20 MEQ tablet Take 20 mEq by mouth once.    . simvastatin (ZOCOR) 20 MG tablet Take 20 mg by mouth daily.    . Vitamin D, Ergocalciferol, (DRISDOL) 50000 units CAPS capsule Take 1 capsule (50,000 Units total) by mouth every 7 (seven) days. 4 capsule 0   No current facility-administered medications on file prior to visit.  PAST MEDICAL HISTORY: Past Medical History:  Diagnosis Date  . Anxiety   . Asthma   . Back pain   . Constipation   . COPD (chronic obstructive pulmonary disease) ([place])   . Depression   . Dyspnea   . Gallbladder problem   . Hyperlipidemia   . Hypertension   . Hypothyroid   . Joint pain   . Leg edema   . Prediabetes     PAST SURGICAL HISTORY: Past Surgical History:  Procedure Laterality Date  . ABDOMINAL HYSTERECTOMY    . CHOLECYSTECTOMY    . hernia repair    . LAPAROSCOPIC GASTRIC BANDING      SOCIAL HISTORY: Social History   Tobacco Use  . Smoking status: Never Smoker  . Smokeless tobacco: Never Used  Substance Use  Topics  . Alcohol use: Not on file  . Drug use: Not on file    FAMILY HISTORY: Family History  Problem Relation Age of Onset  . Breast cancer Maternal Aunt   . Hypertension Mother   . Kidney disease Mother   . Obesity Mother   . Stroke Father   . Heart disease Father   . Alcoholism Father     ROS: Review of Systems  Constitutional: Negative for weight loss.  Endo/Heme/Allergies:       Negative for hypoglycemia    PHYSICAL EXAM: Blood pressure (!) 103/56, pulse 71, temperature 97.9 F (36.6 C), temperature source Oral, height 4' 11"  (1.499 m), weight 207 lb (93.9 kg), SpO2 97 %. Body mass index is 41.81 kg/m. Physical Exam  Constitutional: She is oriented to person, place, and time. She appears well-developed and well-nourished.  Cardiovascular: Normal rate.  Pulmonary/Chest: Effort normal.  Musculoskeletal: Normal range of motion.  Neurological: She is oriented to person, place, and time.  Skin: Skin is warm and dry.  Psychiatric: She has a normal mood and affect. Her behavior is normal.  Vitals reviewed.   RECENT LABS AND TESTS: BMET    Component Value Date/Time   NA 145 (H) 09/25/2017 1135   K 5.0 09/25/2017 1135   CL 105 09/25/2017 1135   CO2 23 09/25/2017 1135   GLUCOSE 88 09/25/2017 1135   BUN 12 09/25/2017 1135   CREATININE 1.13 (H) 09/25/2017 1135   CALCIUM 9.0 09/25/2017 1135   GFRNONAA 50 (L) 09/25/2017 1135   GFRAA 58 (L) 09/25/2017 1135   Lab Results  Component Value Date   HGBA1C 5.6 09/25/2017   HGBA1C 5.7 (H) 05/31/2017   Lab Results  Component Value Date   INSULIN 14.6 09/25/2017   INSULIN 19.4 05/31/2017   CBC    Component Value Date/Time   WBC 5.4 05/31/2017 1158   RBC 4.58 05/31/2017 1158   HGB 13.9 05/31/2017 1158   HCT 41.0 05/31/2017 1158   MCV 90 05/31/2017 1158   MCH 30.3 05/31/2017 1158   MCHC 33.9 05/31/2017 1158   RDW 15.0 05/31/2017 1158   LYMPHSABS 1.8 05/31/2017 1158   EOSABS 0.1 05/31/2017 1158   BASOSABS 0.0  05/31/2017 1158   Iron/TIBC/Ferritin/ %Sat No results found for: IRON, TIBC, FERRITIN, IRONPCTSAT Lipid Panel     Component Value Date/Time   CHOL 165 09/25/2017 1135   TRIG 121 09/25/2017 1135   HDL 59 09/25/2017 1135   LDLCALC 82 09/25/2017 1135   Hepatic Function Panel     Component Value Date/Time   PROT 6.7 09/25/2017 1135   ALBUMIN 4.5 09/25/2017 1135   AST 20 09/25/2017 1135   ALT 14  09/25/2017 1135   ALKPHOS 98 09/25/2017 1135   BILITOT 0.6 09/25/2017 1135      Component Value Date/Time   TSH 0.672 09/25/2017 1135   TSH 1.110 05/31/2017 1158   Results for MARCELYN, RUPPE (MRN [id number]) as of 03/28/2018 16:36  Ref. Range 09/25/2017 11:35  Vitamin D, 25-Hydroxy Latest Ref Range: 30.0 - 100.0 ng/mL 36.6   ASSESSMENT AND PLAN: Type 2 diabetes mellitus without complication, without long-term current use of insulin (HCC) - Plan: liraglutide (VICTOZA) 18 MG/3ML SOPN  Class 3 severe obesity with serious comorbidity and body mass index (BMI) of 40.0 to 44.9 in adult, unspecified obesity type ([place])  PLAN:  Diabetes II Mirka has been given extensive diabetes education by myself today including ideal fasting and post-prandial blood glucose readings, individual ideal Hgb A1c goals and hypoglycemia prevention. We discussed the importance of good blood sugar control to decrease the likelihood of diabetic complications such as nephropathy, neuropathy, limb loss, blindness, coronary artery disease, and death. We discussed the importance of intensive lifestyle modification including diet, exercise and weight loss as the first line treatment for diabetes. Leightyn agrees to continue her diabetes medications and will follow up at the agreed upon time. We will refill nano pen needles to use with Victoza.  Obesity Caidyn is currently in the action stage of change. As such, her goal is to continue with weight loss efforts She has agreed to follow the Category 1 plan or our protein  rich vegetarian plan Nysia has been instructed to work up to a goal of 150 minutes of combined cardio and strengthening exercise per week or start strengthening exercise 2 times per week for weight loss and overall health benefits. We discussed the following Behavioral Modification Strategies today: increasing lean protein intake, decreasing simple carbohydrates , holiday eating strategies and celebration eating strategies  Dior has agreed to follow up with our clinic in 2 to 3 weeks. She was informed of the importance of frequent follow up visits to maximize her success with intensive lifestyle modifications for her multiple health conditions.   OBESITY BEHAVIORAL INTERVENTION VISIT  Today's visit was # 20 out of 22.  Starting weight: 217 lbs Starting date: 05/31/17 Today's weight : 207 lbs Today's date: 03/28/2018 Total lbs lost to date: 10 (Patients must lose 7 lbs in the first 6 months to continue with counseling)   ASK: We discussed the diagnosis of obesity with Shainna F Brow today and Thandiwe agreed to give [name] permission to discuss obesity behavioral modification therapy today.  ASSESS: Avice has the diagnosis of obesity and her BMI today is 41.79 Jazzlynn is in the action stage of change   ADVISE: Darion was educated on the multiple health risks of obesity as well as the benefit of weight loss to improve her health. She was advised of the need for long term treatment and the importance of lifestyle modifications.  AGREE: Multiple dietary modification options and treatment options were discussed and  Tanaiya agreed to the above obesity treatment plan.  I, Doreene Nest, am acting as transcriptionist for Dennard Nip, MD  I have reviewed the above documentation for accuracy and completeness, and I agree with the above. -Dennard Nip, MD

## 2018-03-31 DIAGNOSIS — R0602 Shortness of breath: Secondary | ICD-10-CM | POA: Diagnosis not present

## 2018-04-16 ENCOUNTER — Ambulatory Visit
Admission: RE | Admit: 2018-04-16 | Discharge: 2018-04-16 | Disposition: A | Discharge status: Home / Self Care | Payer: Medicare HMO | Source: Ambulatory Visit | Attending: Family Medicine | Admitting: Family Medicine

## 2018-04-16 DIAGNOSIS — Z1231 Encounter for screening mammogram for malignant neoplasm of breast: Secondary | ICD-10-CM | POA: Diagnosis not present

## 2018-04-18 ENCOUNTER — Encounter (INDEPENDENT_AMBULATORY_CARE_PROVIDER_SITE_OTHER): Payer: Self-pay

## 2018-04-18 ENCOUNTER — Ambulatory Visit (INDEPENDENT_AMBULATORY_CARE_PROVIDER_SITE_OTHER): Payer: Medicare HMO | Admitting: Family Medicine

## 2018-04-18 VITALS — BP 118/65 | HR 64 | Temp 98.1°F | Ht 59.0 in | Wt 206.0 lb

## 2018-04-18 DIAGNOSIS — E559 Vitamin D deficiency, unspecified: Secondary | ICD-10-CM

## 2018-04-18 DIAGNOSIS — Z6841 Body Mass Index (BMI) 40.0 and over, adult: Secondary | ICD-10-CM | POA: Diagnosis not present

## 2018-04-18 DIAGNOSIS — E119 Type 2 diabetes mellitus without complications: Secondary | ICD-10-CM | POA: Diagnosis not present

## 2018-04-18 MED ORDER — INSULIN PEN NEEDLE 32G X 4 MM MISC: 1.0000 | Freq: Every day | 0 refills | Status: DC

## 2018-04-18 MED ORDER — VITAMIN D (ERGOCALCIFEROL) 1.25 MG (50000 UNIT) PO CAPS: 50000.0000 [IU] | ORAL_CAPSULE | ORAL | 0 refills | Status: DC

## 2018-04-19 NOTE — Progress Notes (Signed)
Office: [phone]  /  Fax: [phone]   HPI:   Chief Complaint: OBESITY Jenny Thomas is here to discuss her progress with her obesity treatment plan. She is on the Category 1 plan or follow our protein rich vegetarian plan and is following her eating plan approximately 75 % of the time. She states she is exercising 0 minutes 0 times per week. Jenny Thomas continues to lose weight but is struggling to increase her protein level. She is journaling on and off and sometimes going back to Category 1. She is going on vacation soon.  Her weight is 206 lb (93.4 kg) today and has had a weight loss of 1 pound over a period of 3 weeks since her last visit. She has lost 11 lbs since starting treatment with [name].  Vitamin D Deficiency Jenny Thomas has a diagnosis of vitamin D deficiency. She is stable on prescription Vit D and denies nausea, vomiting or muscle weakness.  Diabetes II Jenny Thomas has a diagnosis of diabetes type II. Jenny Thomas is on Victoza and doing well, out of needles and requests a refill. She denies any hypoglycemic episodes. Last A1c was 5.7. She has been working on intensive lifestyle modifications including diet, exercise, and weight loss to help control her blood glucose levels.  ALLERGIES: Allergies  Allergen Reactions  . Percocet [Oxycodone-Acetaminophen]     MEDICATIONS: Current Outpatient Medications on File Prior to Visit  Medication Sig Dispense Refill  . ACCU-CHEK FASTCLIX LANCETS MISC 1 each by Does not apply route 2 (two) times daily. 102 each 0  . albuterol (ACCUNEB) 0.63 MG/3ML nebulizer solution Take 1 ampule by nebulization every 6 (six) hours as needed for wheezing.    [name] albuterol (PROVENTIL HFA;VENTOLIN HFA) 108 (90 Base) MCG/ACT inhaler Inhale into the lungs every 6 (six) hours as needed for wheezing or shortness of breath.    [name] alendronate (FOSAMAX) 70 MG tablet Take 70 mg by mouth once a week. Take with a full glass of water on an empty stomach.    . Blood Glucose  Monitoring Suppl (ACCU-CHEK NANO SMARTVIEW) w/Device KIT 1 each by Does not apply route daily. Check blood sugar twice a day. 1 kit 0  . Calcium Carb-Cholecalciferol (CALCIUM/VITAMIN D) 500-200 MG-UNIT TABS Take by mouth daily. Take one tablet by mouth once a day    . citalopram (CELEXA) 40 MG tablet Take 40 mg by mouth daily.    . Fluticasone-Salmeterol (ADVAIR) 250-50 MCG/DOSE AEPB Inhale 1 puff into the lungs 2 (two) times daily.    . furosemide (LASIX) 20 MG tablet Take 20 mg by mouth daily as needed.    [name] glucose blood test strip Check blood sugar twice a day 100 each 0  . levothyroxine (SYNTHROID, LEVOTHROID) 88 MCG tablet Take 88 mcg by mouth daily before breakfast.    . liraglutide (VICTOZA) 18 MG/3ML SOPN Inject 0.1 mLs (0.6 mg total) into the skin every morning. 3 pen 0  . losartan (COZAAR) 50 MG tablet Take 1 tablet (50 mg total) by mouth daily. 30 tablet 0  . montelukast (SINGULAIR) 10 MG tablet Take 10 mg by mouth at bedtime.    . polyethylene glycol powder (GLYCOLAX/MIRALAX) powder Take 17 g by mouth daily. 3350 g 0  . potassium chloride SA (K-DUR,KLOR-CON) 20 MEQ tablet Take 20 mEq by mouth once.    . simvastatin (ZOCOR) 20 MG tablet Take 20 mg by mouth daily.     No current facility-administered medications on file prior to visit.     PAST  MEDICAL HISTORY: Past Medical History:  Diagnosis Date  . Anxiety   . Asthma   . Back pain   . Constipation   . COPD (chronic obstructive pulmonary disease) ([place])   . Depression   . Dyspnea   . Gallbladder problem   . Hyperlipidemia   . Hypertension   . Hypothyroid   . Joint pain   . Leg edema   . Prediabetes     PAST SURGICAL HISTORY: Past Surgical History:  Procedure Laterality Date  . ABDOMINAL HYSTERECTOMY    . CHOLECYSTECTOMY    . hernia repair    . LAPAROSCOPIC GASTRIC BANDING      SOCIAL HISTORY: Social History   Tobacco Use  . Smoking status: Never Smoker  . Smokeless tobacco: Never Used  Substance Use  Topics  . Alcohol use: Not on file  . Drug use: Not on file    FAMILY HISTORY: Family History  Problem Relation Age of Onset  . Breast cancer Maternal Aunt   . Hypertension Mother   . Kidney disease Mother   . Obesity Mother   . Stroke Father   . Heart disease Father   . Alcoholism Father     ROS: Review of Systems  Constitutional: Positive for weight loss.  Gastrointestinal: Negative for nausea and vomiting.  Musculoskeletal:       Negative muscle weakness  Endo/Heme/Allergies:       Negative hypoglycemia    PHYSICAL EXAM: Blood pressure 118/65, pulse 64, temperature 98.1 F (36.7 C), temperature source Oral, height _0  (1.499 m), weight 206 lb (93.4 kg), SpO2 98 %. Body mass index is 41.61 kg/m. Physical Exam  Constitutional: She is oriented to person, place, and time. She appears well-developed and well-nourished.  Cardiovascular: Normal rate.  Pulmonary/Chest: Effort normal.  Musculoskeletal: Normal range of motion.  Neurological: She is oriented to person, place, and time.  Skin: Skin is warm and dry.  Psychiatric: She has a normal mood and affect. Her behavior is normal.  Vitals reviewed.   RECENT LABS AND TESTS: BMET    Component Value Date/Time   NA 140 02/01/2018   K 4.8 02/01/2018   CL 105 09/25/2017 1135   CO2 23 09/25/2017 1135   GLUCOSE 88 09/25/2017 1135   BUN 20 02/01/2018   CREATININE 1.0 02/01/2018   CREATININE 1.13 (H) 09/25/2017 1135   CALCIUM 9.0 09/25/2017 1135   GFRNONAA 50 (L) 09/25/2017 1135   GFRAA 58 (L) 09/25/2017 1135   Lab Results  Component Value Date   HGBA1C 5.7 02/01/2018   HGBA1C 5.6 09/25/2017   HGBA1C 5.7 (H) 05/31/2017   Lab Results  Component Value Date   INSULIN 14.6 09/25/2017   INSULIN 19.4 05/31/2017   CBC    Component Value Date/Time   WBC 5.4 05/31/2017 1158   RBC 4.58 05/31/2017 1158   HGB 13.9 05/31/2017 1158   HCT 41.0 05/31/2017 1158   MCV 90 05/31/2017 1158   MCH 30.3 05/31/2017 1158    MCHC 33.9 05/31/2017 1158   RDW 15.0 05/31/2017 1158   LYMPHSABS 1.8 05/31/2017 1158   EOSABS 0.1 05/31/2017 1158   BASOSABS 0.0 05/31/2017 1158   Iron/TIBC/Ferritin/ %Sat No results found for: IRON, TIBC, FERRITIN, IRONPCTSAT Lipid Panel     Component Value Date/Time   CHOL 168 02/01/2018   CHOL 165 09/25/2017 1135   TRIG 106 02/01/2018   HDL 71 (A) 02/01/2018   HDL 59 09/25/2017 1135   LDLCALC 76 02/01/2018   LDLCALC 82  09/25/2017 1135   Hepatic Function Panel     Component Value Date/Time   PROT 6.7 09/25/2017 1135   ALBUMIN 4.5 09/25/2017 1135   AST 23 02/01/2018   ALT 11 02/01/2018   ALKPHOS 98 09/25/2017 1135   BILITOT 0.6 09/25/2017 1135      Component Value Date/Time   TSH 1.09 02/01/2018   TSH 0.672 09/25/2017 1135   TSH 1.110 05/31/2017 1158  Results for CARO, BRUNDIDGE (MRN [id number]) as of 04/19/2018 08:02  Ref. Range 09/25/2017 11:35  Vitamin D, 25-Hydroxy Latest Ref Range: 30.0 - 100.0 ng/mL 36.6    ASSESSMENT AND PLAN: Vitamin D deficiency - Plan: Vitamin D, Ergocalciferol, (DRISDOL) 50000 units CAPS capsule  Type 2 diabetes mellitus without complication, without long-term current use of insulin (HCC) - Plan: Insulin Pen Needle (BD PEN NEEDLE NANO U/F) 32G X 4 MM MISC  Class 3 severe obesity with serious comorbidity and body mass index (BMI) of 40.0 to 44.9 in adult, unspecified obesity type ([place])  PLAN:  Vitamin D Deficiency Jenny Thomas was informed that low vitamin D levels contributes to fatigue and are associated with obesity, breast, and colon cancer. Jenny Thomas agrees to continue taking prescription Vit D _0 ,000 IU every week #4 and we will refill for 1 month. She will follow up for routine testing of vitamin D, at least 2-3 times per year. She was informed of the risk of over-replacement of vitamin D and agrees to not increase her dose unless she discusses this with [name] first. Jenny Thomas agrees to follow up with our clinic in 2 to 3 weeks.  Diabetes  II Jenny Thomas has been given extensive diabetes education by myself today including ideal fasting and post-prandial blood glucose readings, individual ideal Hgb A1c goals and hypoglycemia prevention. We discussed the importance of good blood sugar control to decrease the likelihood of diabetic complications such as nephropathy, neuropathy, limb loss, blindness, coronary artery disease, and death. We discussed the importance of intensive lifestyle modification including diet, exercise and weight loss as the first line treatment for diabetes. Jenny Thomas agrees to continue her diabetes medications and we will refill nano needles #100, no refills. Jenny Thomas agrees to follow up with our clinic in 2 to 3 weeks.  Obesity Jenny Thomas is currently in the action stage of change. As such, her goal is to continue with weight loss efforts She has agreed to keep a food journal with 1000-1200 calories and 75+ grams of protein daily Jenny Thomas has been instructed to work up to a goal of 150 minutes of combined cardio and strengthening exercise per week for weight loss and overall health benefits. We discussed the following Behavioral Modification Strategies today: increasing lean protein intake, decreasing simple carbohydrates , work on meal planning and easy cooking plans and travel eating strategies    Jenny Thomas has agreed to follow up with our clinic in 2 to 3 weeks. She was informed of the importance of frequent follow up visits to maximize her success with intensive lifestyle modifications for her multiple health conditions.   OBESITY BEHAVIORAL INTERVENTION VISIT  Today's visit was # 21 out of 22.  Starting weight: 217 lbs  Starting date: 05/31/17 Today's weight : 206 lbs Today's date: 04/18/2018 Total lbs lost to date: 11 (Patients must lose 7 lbs in the first 6 months to continue with counseling)   ASK: We discussed the diagnosis of obesity with Jenny Thomas today and Jenny Thomas agreed to give [name] permission to  discuss obesity behavioral modification therapy today.  ASSESS:  Jenny Thomas has the diagnosis of obesity and her BMI today is 41.58 Jenny Thomas is in the action stage of change   ADVISE: Jenny Thomas was educated on the multiple health risks of obesity as well as the benefit of weight loss to improve her health. She was advised of the need for long term treatment and the importance of lifestyle modifications.  AGREE: Multiple dietary modification options and treatment options were discussed and  Jenny Thomas agreed to the above obesity treatment plan.  I, Jenny Thomas, am acting as transcriptionist for Dennard Nip, MD  I have reviewed the above documentation for accuracy and completeness, and I agree with the above. -Dennard Nip, MD

## 2018-04-30 DIAGNOSIS — R0602 Shortness of breath: Secondary | ICD-10-CM | POA: Diagnosis not present

## 2018-05-16 ENCOUNTER — Ambulatory Visit (INDEPENDENT_AMBULATORY_CARE_PROVIDER_SITE_OTHER): Payer: Medicare HMO | Admitting: Family Medicine

## 2018-05-16 VITALS — BP 104/63 | HR 72 | Temp 98.2°F | Ht 59.0 in | Wt 211.0 lb

## 2018-05-16 DIAGNOSIS — E559 Vitamin D deficiency, unspecified: Secondary | ICD-10-CM | POA: Diagnosis not present

## 2018-05-16 DIAGNOSIS — Z6841 Body Mass Index (BMI) 40.0 and over, adult: Secondary | ICD-10-CM

## 2018-05-16 MED ORDER — VITAMIN D (ERGOCALCIFEROL) 1.25 MG (50000 UNIT) PO CAPS: 50000.0000 [IU] | ORAL_CAPSULE | ORAL | 0 refills | Status: DC

## 2018-05-16 NOTE — Progress Notes (Signed)
Office: [phone]  /  Fax: [phone]   HPI:   Chief Complaint: OBESITY Jenny Thomas is here to discuss her progress with her obesity treatment plan. She is on the keep a food journal with 1000-1200 calories and 75+ grams of protein daily and is following her eating plan approximately 20 % of the time. She states she is dancerizing and walking for 20-60 minutes 5 times per week. Jenny Thomas was on vacation and had some celebration eating and has gained weight. She tried to portion control and make smarter food choices and worked on increasing protein and vegetables. She is ready to get back on track but has a history of frequently deviating from her plan.  Her weight is 211 lb (95.7 kg) today and has gained 5 pounds since her last visit. She has lost 6 lbs since starting treatment with Jenny Thomas.  Jenny Thomas Jenny Thomas has a diagnosis of Jenny Thomas. She is stable on prescription Vit D, not yet at goal. She denies nausea, vomiting or muscle weakness.  ALLERGIES: Allergies  Allergen Reactions  . Percocet [Oxycodone-Acetaminophen]     MEDICATIONS: Current Outpatient Medications on File Prior to Visit  Medication Sig Dispense Refill  . ACCU-CHEK FASTCLIX LANCETS MISC 1 each by Does not apply route 2 (two) times daily. 102 each 0  . albuterol (ACCUNEB) 0.63 MG/3ML nebulizer solution Take 1 ampule by nebulization every 6 (six) hours as needed for wheezing.    [name] albuterol (PROVENTIL HFA;VENTOLIN HFA) 108 (90 Base) MCG/ACT inhaler Inhale into the lungs every 6 (six) hours as needed for wheezing or shortness of breath.    [name] alendronate (FOSAMAX) 70 MG tablet Take 70 mg by mouth once a week. Take with a full glass of water on an empty stomach.    . Blood Glucose Monitoring Suppl (ACCU-CHEK NANO SMARTVIEW) w/Device KIT 1 each by Does not apply route daily. Check blood sugar twice a day. 1 kit 0  . Calcium Carb-Cholecalciferol (CALCIUM/Jenny D) 500-200 MG-UNIT TABS Take by mouth daily.  Take one tablet by mouth once a day    . citalopram (CELEXA) 40 MG tablet Take 40 mg by mouth daily.    . Fluticasone-Salmeterol (ADVAIR) 250-50 MCG/DOSE AEPB Inhale 1 puff into the lungs 2 (two) times daily.    . furosemide (LASIX) 20 MG tablet Take 20 mg by mouth daily as needed.    [name] glucose blood test strip Check blood sugar twice a day 100 each 0  . Insulin Pen Needle (BD PEN NEEDLE NANO U/F) 32G X 4 MM MISC 1 each by Does not apply route daily. 100 each 0  . levothyroxine (SYNTHROID, LEVOTHROID) 88 MCG tablet Take 88 mcg by mouth daily before breakfast.    . liraglutide (VICTOZA) 18 MG/3ML SOPN Inject 0.1 mLs (0.6 mg total) into the skin every morning. 3 pen 0  . losartan (COZAAR) 50 MG tablet Take 1 tablet (50 mg total) by mouth daily. 30 tablet 0  . montelukast (SINGULAIR) 10 MG tablet Take 10 mg by mouth at bedtime.    . polyethylene glycol powder (GLYCOLAX/MIRALAX) powder Take 17 g by mouth daily. 3350 g 0  . potassium chloride SA (K-DUR,KLOR-CON) 20 MEQ tablet Take 20 mEq by mouth once.    . simvastatin (ZOCOR) 20 MG tablet Take 20 mg by mouth daily.    . Jenny D, Ergocalciferol, (DRISDOL) 50000 units CAPS capsule Take 1 capsule (50,000 Units total) by mouth every 7 (seven) days. 4 capsule 0   No current  facility-administered medications on file prior to visit.     PAST MEDICAL HISTORY: Past Medical History:  Diagnosis Date  . Anxiety   . Asthma   . Back pain   . Constipation   . COPD (chronic obstructive pulmonary disease) ([place])   . Depression   . Dyspnea   . Gallbladder problem   . Hyperlipidemia   . Hypertension   . Hypothyroid   . Joint pain   . Leg edema   . Prediabetes     PAST SURGICAL HISTORY: Past Surgical History:  Procedure Laterality Date  . ABDOMINAL HYSTERECTOMY    . CHOLECYSTECTOMY    . hernia repair    . LAPAROSCOPIC GASTRIC BANDING      SOCIAL HISTORY: Social History   Tobacco Use  . Smoking status: Never Smoker  . Smokeless tobacco:  Never Used  Substance Use Topics  . Alcohol use: Not on file  . Drug use: Not on file    FAMILY HISTORY: Family History  Problem Relation Age of Onset  . Breast cancer Maternal Aunt   . Hypertension Mother   . Kidney disease Mother   . Obesity Mother   . Stroke Father   . Heart disease Father   . Alcoholism Father     ROS: Review of Systems  Constitutional: Negative for weight loss.  Gastrointestinal: Negative for nausea and vomiting.  Musculoskeletal:       Negative muscle weakness    PHYSICAL EXAM: Blood pressure 104/63, pulse 72, temperature 98.2 F (36.8 C), temperature source Oral, height 4' 11"  (1.499 m), weight 211 lb (95.7 kg), SpO2 98 %. Body mass index is 42.62 kg/m. Physical Exam  Constitutional: She is oriented to person, place, and time. She appears well-developed and well-nourished.  Cardiovascular: Normal rate.  Pulmonary/Chest: Effort normal.  Musculoskeletal: Normal range of motion.  Neurological: She is oriented to person, place, and time.  Skin: Skin is warm and dry.  Psychiatric: She has a normal mood and affect. Her behavior is normal.  Vitals reviewed.   RECENT LABS AND TESTS: BMET    Component Value Date/Time   NA 140 02/01/2018   K 4.8 02/01/2018   CL 105 09/25/2017 1135   CO2 23 09/25/2017 1135   GLUCOSE 88 09/25/2017 1135   BUN 20 02/01/2018   CREATININE 1.0 02/01/2018   CREATININE 1.13 (H) 09/25/2017 1135   CALCIUM 9.0 09/25/2017 1135   GFRNONAA 50 (L) 09/25/2017 1135   GFRAA 58 (L) 09/25/2017 1135   Lab Results  Component Value Date   HGBA1C 5.7 02/01/2018   HGBA1C 5.6 09/25/2017   HGBA1C 5.7 (H) 05/31/2017   Lab Results  Component Value Date   INSULIN 14.6 09/25/2017   INSULIN 19.4 05/31/2017   CBC    Component Value Date/Time   WBC 5.4 05/31/2017 1158   RBC 4.58 05/31/2017 1158   HGB 13.9 05/31/2017 1158   HCT 41.0 05/31/2017 1158   MCV 90 05/31/2017 1158   MCH 30.3 05/31/2017 1158   MCHC 33.9 05/31/2017 1158     RDW 15.0 05/31/2017 1158   LYMPHSABS 1.8 05/31/2017 1158   EOSABS 0.1 05/31/2017 1158   BASOSABS 0.0 05/31/2017 1158   Iron/TIBC/Ferritin/ %Sat No results found for: IRON, TIBC, FERRITIN, IRONPCTSAT Lipid Panel     Component Value Date/Time   CHOL 168 02/01/2018   CHOL 165 09/25/2017 1135   TRIG 106 02/01/2018   HDL 71 (A) 02/01/2018   HDL 59 09/25/2017 1135   LDLCALC 76 02/01/2018  LDLCALC 82 09/25/2017 1135   Hepatic Function Panel     Component Value Date/Time   PROT 6.7 09/25/2017 1135   ALBUMIN 4.5 09/25/2017 1135   AST 23 02/01/2018   ALT 11 02/01/2018   ALKPHOS 98 09/25/2017 1135   BILITOT 0.6 09/25/2017 1135      Component Value Date/Time   TSH 1.09 02/01/2018   TSH 0.672 09/25/2017 1135   TSH 1.110 05/31/2017 1158  Results for SHENEA, GIACOBBE (MRN [id number]) as of 05/16/2018 13:24  Ref. Range 09/25/2017 11:35  Jenny D, 25-Hydroxy Latest Ref Range: 30.0 - 100.0 ng/mL 36.6    ASSESSMENT AND PLAN: Jenny Thomas - Plan: Jenny D, Ergocalciferol, (DRISDOL) 50000 units CAPS capsule  Class 3 severe obesity with serious comorbidity and body mass index (BMI) of 40.0 to 44.9 in adult, unspecified obesity type (HCC)  PLAN:  Jenny Thomas Jenny Thomas was informed that low Jenny D levels contributes to fatigue and are associated with obesity, breast, and colon cancer. Jenny Thomas agrees to continue taking prescription Vit D @50 ,000 IU every week #4 and we will refill for 1 month. She will follow up for routine testing of Jenny D, at least 2-3 times per year. She was informed of the risk of over-replacement of Jenny D and agrees to not increase her dose unless she discusses this with Jenny Thomas first. Jenny Thomas agrees to follow up with our clinic in 2 to 3 weeks.  Obesity Jenny Thomas is currently in the action stage of change. As such, her goal is to continue with weight loss efforts She has agreed to change to follow a lower carbohydrate, vegetable and lean  protein rich diet plan Jenny Thomas has been instructed to work up to a goal of 150 minutes of combined cardio and strengthening exercise per week for weight loss and overall health benefits. We discussed the following Behavioral Modification Strategies today: increasing lean protein intake, decreasing simple carbohydrates, decrease eating out, work on meal planning and easy cooking plans, and no skipping meals   Jenny Thomas has agreed to follow up with our clinic in 2 to 3 weeks. She was informed of the importance of frequent follow up visits to maximize her success with intensive lifestyle modifications for her multiple health conditions.   OBESITY BEHAVIORAL INTERVENTION VISIT  Today's visit was # 22 out of 22.  Starting weight: 217 lbs Starting date: 05/31/17 Today's weight : 211 lbs Today's date: 05/16/2018 Total lbs lost to date: 6 (Patients must lose 7 lbs in the first 6 months to continue with counseling)   ASK: We discussed the diagnosis of obesity with Jenny Thomas today and Jenny Thomas agreed to give Jenny Thomas permission to discuss obesity behavioral modification therapy today.  ASSESS: Jenny Thomas has the diagnosis of obesity and her BMI today is 42.59 Jenny Thomas is in the action stage of change   ADVISE: Jenny Thomas was educated on the multiple health risks of obesity as well as the benefit of weight loss to improve her health. She was advised of the need for long term treatment and the importance of lifestyle modifications.  AGREE: Multiple dietary modification options and treatment options were discussed and  Jenny Thomas agreed to the above obesity treatment plan.  I, Trixie Dredge, am acting as transcriptionist for Dennard Nip, MD  I have reviewed the above documentation for accuracy and completeness, and I agree with the above. -Dennard Nip, MD

## 2018-05-31 DIAGNOSIS — R0602 Shortness of breath: Secondary | ICD-10-CM | POA: Diagnosis not present

## 2018-06-05 ENCOUNTER — Ambulatory Visit (INDEPENDENT_AMBULATORY_CARE_PROVIDER_SITE_OTHER): Payer: Medicare HMO | Admitting: Family Medicine

## 2018-06-05 VITALS — BP 102/64 | HR 62 | Temp 97.9°F | Ht 59.0 in | Wt 207.0 lb

## 2018-06-05 DIAGNOSIS — E66813 Obesity, class 3: Secondary | ICD-10-CM

## 2018-06-05 DIAGNOSIS — Z6841 Body Mass Index (BMI) 40.0 and over, adult: Secondary | ICD-10-CM | POA: Diagnosis not present

## 2018-06-05 DIAGNOSIS — E559 Vitamin D deficiency, unspecified: Secondary | ICD-10-CM

## 2018-06-05 DIAGNOSIS — E119 Type 2 diabetes mellitus without complications: Secondary | ICD-10-CM

## 2018-06-05 DIAGNOSIS — J449 Chronic obstructive pulmonary disease, unspecified: Secondary | ICD-10-CM | POA: Diagnosis not present

## 2018-06-05 MED ORDER — VITAMIN D (ERGOCALCIFEROL) 1.25 MG (50000 UNIT) PO CAPS: 50000.0000 [IU] | ORAL_CAPSULE | ORAL | 0 refills | Status: DC

## 2018-06-05 NOTE — Progress Notes (Signed)
Office: [phone]  /  Fax: [phone]   HPI:   Chief Complaint: OBESITY Jenny Thomas is here to discuss her progress with her obesity treatment plan. She is on the lower carbohydrate, vegetable and lean protein rich diet plan and is following her eating plan approximately 70 % of the time. She states she is dancing for 60 minutes 1 times per week. Jenny Thomas continues to do well with weight loss. She changed to low carbohydrate for 1 week but then changed back to Category 2 plan and like this better.  Her weight is 207 lb (93.9 kg) today and has had a weight loss of 4 pounds over a period of 3 weeks since her last visit. She has lost 10 lbs since starting treatment with [name].  Diabetes II Jenny Thomas has a diagnosis of diabetes type II. Jenny Thomas states fasting BGs decreased to 80's on low carbohydrate and didn't feel well so she changed back to Category 2 plan and now glucose runs in 120's. She denies nausea, vomiting, or hypoglycemia. Last A1c was 5.7. She has been working on intensive lifestyle modifications including diet, exercise, and weight loss to help control her blood glucose levels.  Vitamin D Deficiency Jenny Thomas has a diagnosis of vitamin D deficiency. She is stable on prescription Vit D, having labs done soon at primary care physician office per patient. She denies nausea, vomiting or muscle weakness.  ALLERGIES: Allergies  Allergen Reactions  . Percocet [Oxycodone-Acetaminophen]     MEDICATIONS: Current Outpatient Medications on File Prior to Visit  Medication Sig Dispense Refill  . ACCU-CHEK FASTCLIX LANCETS MISC 1 each by Does not apply route 2 (two) times daily. 102 each 0  . albuterol (ACCUNEB) 0.63 MG/3ML nebulizer solution Take 1 ampule by nebulization every 6 (six) hours as needed for wheezing.    [name] albuterol (PROVENTIL HFA;VENTOLIN HFA) 108 (90 Base) MCG/ACT inhaler Inhale into the lungs every 6 (six) hours as needed for wheezing or shortness of breath.    [name] alendronate  (FOSAMAX) 70 MG tablet Take 70 mg by mouth once a week. Take with a full glass of water on an empty stomach.    . Blood Glucose Monitoring Suppl (ACCU-CHEK NANO SMARTVIEW) w/Device KIT 1 each by Does not apply route daily. Check blood sugar twice a day. 1 kit 0  . Calcium Carb-Cholecalciferol (CALCIUM/VITAMIN D) 500-200 MG-UNIT TABS Take by mouth daily. Take one tablet by mouth once a day    . citalopram (CELEXA) 40 MG tablet Take 40 mg by mouth daily.    . Fluticasone-Salmeterol (ADVAIR) 250-50 MCG/DOSE AEPB Inhale 1 puff into the lungs 2 (two) times daily.    . furosemide (LASIX) 20 MG tablet Take 20 mg by mouth daily as needed.    [name] glucose blood test strip Check blood sugar twice a day 100 each 0  . Insulin Pen Needle (BD PEN NEEDLE NANO U/F) 32G X 4 MM MISC 1 each by Does not apply route daily. 100 each 0  . levothyroxine (SYNTHROID, LEVOTHROID) 88 MCG tablet Take 88 mcg by mouth daily before breakfast.    . liraglutide (VICTOZA) 18 MG/3ML SOPN Inject 0.1 mLs (0.6 mg total) into the skin every morning. 3 pen 0  . losartan (COZAAR) 50 MG tablet Take 1 tablet (50 mg total) by mouth daily. 30 tablet 0  . montelukast (SINGULAIR) 10 MG tablet Take 10 mg by mouth at bedtime.    . polyethylene glycol powder (GLYCOLAX/MIRALAX) powder Take 17 g by mouth daily. 3350 g 0  .  potassium chloride SA (K-DUR,KLOR-CON) 20 MEQ tablet Take 20 mEq by mouth once.    . simvastatin (ZOCOR) 20 MG tablet Take 20 mg by mouth daily.     No current facility-administered medications on file prior to visit.     PAST MEDICAL HISTORY: Past Medical History:  Diagnosis Date  . Anxiety   . Asthma   . Back pain   . Constipation   . COPD (chronic obstructive pulmonary disease) ([place])   . Depression   . Dyspnea   . Gallbladder problem   . Hyperlipidemia   . Hypertension   . Hypothyroid   . Joint pain   . Leg edema   . Prediabetes     PAST SURGICAL HISTORY: Past Surgical History:  Procedure Laterality Date  .  ABDOMINAL HYSTERECTOMY    . CHOLECYSTECTOMY    . hernia repair    . LAPAROSCOPIC GASTRIC BANDING      SOCIAL HISTORY: Social History   Tobacco Use  . Smoking status: Never Smoker  . Smokeless tobacco: Never Used  Substance Use Topics  . Alcohol use: Not on file  . Drug use: Not on file    FAMILY HISTORY: Family History  Problem Relation Age of Onset  . Breast cancer Maternal Aunt   . Hypertension Mother   . Kidney disease Mother   . Obesity Mother   . Stroke Father   . Heart disease Father   . Alcoholism Father     ROS: Review of Systems  Constitutional: Positive for weight loss.  Gastrointestinal: Negative for nausea and vomiting.  Musculoskeletal:       Negative muscle weakness  Endo/Heme/Allergies:       Negative hypoglycemia    PHYSICAL EXAM: Blood pressure 102/64, pulse 62, temperature 97.9 F (36.6 C), temperature source Oral, height 4' 11" (1.499 m), weight 207 lb (93.9 kg), SpO2 98 %. Body mass index is 41.81 kg/m. Physical Exam  Constitutional: She is oriented to person, place, and time. She appears well-developed and well-nourished.  Cardiovascular: Normal rate.  Pulmonary/Chest: Effort normal.  Musculoskeletal: Normal range of motion.  Neurological: She is oriented to person, place, and time.  Skin: Skin is warm and dry.  Psychiatric: She has a normal mood and affect. Her behavior is normal.  Vitals reviewed.   RECENT LABS AND TESTS: BMET    Component Value Date/Time   NA 140 02/01/2018   K 4.8 02/01/2018   CL 105 09/25/2017 1135   CO2 23 09/25/2017 1135   GLUCOSE 88 09/25/2017 1135   BUN 20 02/01/2018   CREATININE 1.0 02/01/2018   CREATININE 1.13 (H) 09/25/2017 1135   CALCIUM 9.0 09/25/2017 1135   GFRNONAA 50 (L) 09/25/2017 1135   GFRAA 58 (L) 09/25/2017 1135   Lab Results  Component Value Date   HGBA1C 5.7 02/01/2018   HGBA1C 5.6 09/25/2017   HGBA1C 5.7 (H) 05/31/2017   Lab Results  Component Value Date   INSULIN 14.6  09/25/2017   INSULIN 19.4 05/31/2017   CBC    Component Value Date/Time   WBC 5.4 05/31/2017 1158   RBC 4.58 05/31/2017 1158   HGB 13.9 05/31/2017 1158   HCT 41.0 05/31/2017 1158   MCV 90 05/31/2017 1158   MCH 30.3 05/31/2017 1158   MCHC 33.9 05/31/2017 1158   RDW 15.0 05/31/2017 1158   LYMPHSABS 1.8 05/31/2017 1158   EOSABS 0.1 05/31/2017 1158   BASOSABS 0.0 05/31/2017 1158   Iron/TIBC/Ferritin/ %Sat No results found for: IRON, TIBC, FERRITIN, IRONPCTSAT  Lipid Panel     Component Value Date/Time   CHOL 168 02/01/2018   CHOL 165 09/25/2017 1135   TRIG 106 02/01/2018   HDL 71 (A) 02/01/2018   HDL 59 09/25/2017 1135   LDLCALC 76 02/01/2018   LDLCALC 82 09/25/2017 1135   Hepatic Function Panel     Component Value Date/Time   PROT 6.7 09/25/2017 1135   ALBUMIN 4.5 09/25/2017 1135   AST 23 02/01/2018   ALT 11 02/01/2018   ALKPHOS 98 09/25/2017 1135   BILITOT 0.6 09/25/2017 1135      Component Value Date/Time   TSH 1.09 02/01/2018   TSH 0.672 09/25/2017 1135   TSH 1.110 05/31/2017 1158  Results for JEYMI, HEPP (MRN [id number]) as of 06/05/2018 10:50  Ref. Range 09/25/2017 11:35  Vitamin D, 25-Hydroxy Latest Ref Range: 30.0 - 100.0 ng/mL 36.6    ASSESSMENT AND PLAN: Type 2 diabetes mellitus without complication, without long-term current use of insulin (HCC)  Vitamin D deficiency - Plan: Vitamin D, Ergocalciferol, (DRISDOL) 50000 units CAPS capsule  Class 3 severe obesity with serious comorbidity and body mass index (BMI) of 40.0 to 44.9 in adult, unspecified obesity type ([place])  PLAN:  Diabetes II Jenny Thomas has been given extensive diabetes education by myself today including ideal fasting and post-prandial blood glucose readings, individual ideal Hgb A1c goals and hypoglycemia prevention. We discussed the importance of good blood sugar control to decrease the likelihood of diabetic complications such as nephropathy, neuropathy, limb loss, blindness, coronary  artery disease, and death. We discussed the importance of intensive lifestyle modification including diet, exercise and weight loss as the first line treatment for diabetes. Jenny Thomas agrees to continue her diabetes medications, diet, and exercise and we will check labs in 1 month. Jenny Thomas agrees to follow up with our clinic in 3 to 4 weeks.  Vitamin D Deficiency Jenny Thomas was informed that low vitamin D levels contributes to fatigue and are associated with obesity, breast, and colon cancer. Jenny Thomas agrees to continue taking prescription Vit D _0 ,000 IU every week #4 and we will refill for 1 month. She will follow up for routine testing of vitamin D, at least 2-3 times per year. She was informed of the risk of over-replacement of vitamin D and agrees to not increase her dose unless she discusses this with [name] first. Jenny Thomas agrees to follow up with our clinic in 3 to 4 weeks.  Obesity Jenny Thomas is currently in the action stage of change. As such, her goal is to continue with weight loss efforts She has agreed to follow the Category 2 plan Jenny Thomas has been instructed to work up to a goal of 150 minutes of combined cardio and strengthening exercise per week for weight loss and overall health benefits. We discussed the following Behavioral Modification Strategies today: increasing lean protein intake and decreasing simple carbohydrates    Jenny Thomas has agreed to follow up with our clinic in 3 to 4 weeks. She was informed of the importance of frequent follow up visits to maximize her success with intensive lifestyle modifications for her multiple health conditions.   Thomas, Trixie Dredge, am acting as transcriptionist for Dennard Nip, MD  Thomas have reviewed the above documentation for accuracy and completeness, and Thomas agree with the above. -Dennard Nip, MD

## 2018-06-12 DIAGNOSIS — H2513 Age-related nuclear cataract, bilateral: Secondary | ICD-10-CM | POA: Diagnosis not present

## 2018-06-12 DIAGNOSIS — H25013 Cortical age-related cataract, bilateral: Secondary | ICD-10-CM | POA: Diagnosis not present

## 2018-06-12 DIAGNOSIS — H40013 Open angle with borderline findings, low risk, bilateral: Secondary | ICD-10-CM | POA: Diagnosis not present

## 2018-06-12 DIAGNOSIS — H2512 Age-related nuclear cataract, left eye: Secondary | ICD-10-CM | POA: Diagnosis not present

## 2018-06-12 DIAGNOSIS — H18413 Arcus senilis, bilateral: Secondary | ICD-10-CM | POA: Diagnosis not present

## 2018-06-19 ENCOUNTER — Other Ambulatory Visit (INDEPENDENT_AMBULATORY_CARE_PROVIDER_SITE_OTHER): Payer: Self-pay | Admitting: Family Medicine

## 2018-06-19 DIAGNOSIS — E119 Type 2 diabetes mellitus without complications: Secondary | ICD-10-CM

## 2018-06-20 DIAGNOSIS — E559 Vitamin D deficiency, unspecified: Secondary | ICD-10-CM | POA: Diagnosis not present

## 2018-06-20 DIAGNOSIS — E039 Hypothyroidism, unspecified: Secondary | ICD-10-CM | POA: Diagnosis not present

## 2018-06-20 DIAGNOSIS — E7800 Pure hypercholesterolemia, unspecified: Secondary | ICD-10-CM | POA: Diagnosis not present

## 2018-06-20 DIAGNOSIS — E119 Type 2 diabetes mellitus without complications: Secondary | ICD-10-CM | POA: Diagnosis not present

## 2018-06-20 DIAGNOSIS — I10 Essential (primary) hypertension: Secondary | ICD-10-CM | POA: Diagnosis not present

## 2018-06-23 ENCOUNTER — Other Ambulatory Visit (INDEPENDENT_AMBULATORY_CARE_PROVIDER_SITE_OTHER): Payer: Self-pay | Admitting: Family Medicine

## 2018-06-23 DIAGNOSIS — E119 Type 2 diabetes mellitus without complications: Secondary | ICD-10-CM

## 2018-06-26 ENCOUNTER — Ambulatory Visit (INDEPENDENT_AMBULATORY_CARE_PROVIDER_SITE_OTHER): Payer: Medicare HMO | Admitting: Family Medicine

## 2018-06-26 VITALS — BP 98/49 | HR 64 | Temp 98.1°F | Ht 59.0 in | Wt 210.0 lb

## 2018-06-26 DIAGNOSIS — E119 Type 2 diabetes mellitus without complications: Secondary | ICD-10-CM | POA: Diagnosis not present

## 2018-06-26 DIAGNOSIS — Z6841 Body Mass Index (BMI) 40.0 and over, adult: Secondary | ICD-10-CM

## 2018-06-26 DIAGNOSIS — K5909 Other constipation: Secondary | ICD-10-CM | POA: Diagnosis not present

## 2018-06-26 MED ORDER — GLUCOSE BLOOD VI STRP: ORAL_STRIP | 0 refills | Status: DC

## 2018-06-26 MED ORDER — INSULIN PEN NEEDLE 32G X 4 MM MISC: 1.0000 | Freq: Every day | 0 refills | Status: DC

## 2018-06-26 MED ORDER — POLYETHYLENE GLYCOL 3350 17 GM/SCOOP PO POWD: 17.0000 g | Freq: Every day | ORAL | 0 refills | Status: DC

## 2018-06-26 MED ORDER — LIRAGLUTIDE 18 MG/3ML ~~LOC~~ SOPN: 0.6000 mg | PEN_INJECTOR | SUBCUTANEOUS | 0 refills | Status: DC

## 2018-06-26 MED ORDER — ACCU-CHEK FASTCLIX LANCETS MISC
1.0000 | Freq: Two times a day (BID) | 0 refills | Status: AC
Start: 2018-06-26 — End: ?

## 2018-06-26 NOTE — Progress Notes (Signed)
Office: [phone]  /  Fax: [phone]   HPI:   Chief Complaint: OBESITY Jenny Thomas is here to discuss her progress with her obesity treatment plan. She is on the Category 2 plan and is following her eating plan approximately 50 % of the time. She states she is doing dancerize (staying busy more) for 60 minutes 1 times per week. Jenny Thomas is retaining fluid today, feels her hands and feet are more puffy. She is deviating from her plan more with company but still being mindful.  Her weight is 210 lb (95.3 kg) today and has gained 3 pounds since her last visit. She has lost 7 lbs since starting treatment with [name].  Constipation Jenny Thomas notes increased straining with BM, can go "days" without a BM. Takes miralax rarely. She denies hematochezia or melena. She admits to drinking less H20 recently.  Diabetes II Jenny Thomas has a diagnosis of diabetes type II. [name] last A1c was 5.7, well controlled on Victoza. She denies nausea, vomiting, or diarrhea and she denies any hypoglycemic episodes. She has been working on intensive lifestyle modifications including diet, exercise, and weight loss to help control her blood glucose levels.  ALLERGIES: Allergies  Allergen Reactions  . Percocet [Oxycodone-Acetaminophen]     MEDICATIONS: Current Outpatient Medications on File Prior to Visit  Medication Sig Dispense Refill  . albuterol (ACCUNEB) 0.63 MG/3ML nebulizer solution Take 1 ampule by nebulization every 6 (six) hours as needed for wheezing.    [name] albuterol (PROVENTIL HFA;VENTOLIN HFA) 108 (90 Base) MCG/ACT inhaler Inhale into the lungs every 6 (six) hours as needed for wheezing or shortness of breath.    [name] alendronate (FOSAMAX) 70 MG tablet Take 70 mg by mouth once a week. Take with a full glass of water on an empty stomach.    . Blood Glucose Monitoring Suppl (ACCU-CHEK NANO SMARTVIEW) w/Device KIT 1 each by Does not apply route daily. Check blood sugar twice a day. 1 kit 0  . Calcium  Carb-Cholecalciferol (CALCIUM/VITAMIN D) 500-200 MG-UNIT TABS Take by mouth daily. Take one tablet by mouth once a day    . citalopram (CELEXA) 40 MG tablet Take 40 mg by mouth daily.    . Fluticasone-Salmeterol (ADVAIR) 250-50 MCG/DOSE AEPB Inhale 1 puff into the lungs 2 (two) times daily.    . furosemide (LASIX) 20 MG tablet Take 20 mg by mouth daily as needed.    [name] levothyroxine (SYNTHROID, LEVOTHROID) 88 MCG tablet Take 88 mcg by mouth daily before breakfast.    . losartan (COZAAR) 50 MG tablet Take 1 tablet (50 mg total) by mouth daily. 30 tablet 0  . montelukast (SINGULAIR) 10 MG tablet Take 10 mg by mouth at bedtime.    . potassium chloride SA (K-DUR,KLOR-CON) 20 MEQ tablet Take 20 mEq by mouth once.    . simvastatin (ZOCOR) 20 MG tablet Take 20 mg by mouth daily.    . Vitamin D, Ergocalciferol, (DRISDOL) 50000 units CAPS capsule Take 1 capsule (50,000 Units total) by mouth every 7 (seven) days. 4 capsule 0   No current facility-administered medications on file prior to visit.     PAST MEDICAL HISTORY: Past Medical History:  Diagnosis Date  . Anxiety   . Asthma   . Back pain   . Constipation   . COPD (chronic obstructive pulmonary disease) ([place])   . Depression   . Dyspnea   . Gallbladder problem   . Hyperlipidemia   . Hypertension   . Hypothyroid   . Joint pain   .  Leg edema   . Prediabetes     PAST SURGICAL HISTORY: Past Surgical History:  Procedure Laterality Date  . ABDOMINAL HYSTERECTOMY    . CHOLECYSTECTOMY    . hernia repair    . LAPAROSCOPIC GASTRIC BANDING      SOCIAL HISTORY: Social History   Tobacco Use  . Smoking status: Never Smoker  . Smokeless tobacco: Never Used  Substance Use Topics  . Alcohol use: Not on file  . Drug use: Not on file    FAMILY HISTORY: Family History  Problem Relation Age of Onset  . Breast cancer Maternal Aunt   . Hypertension Mother   . Kidney disease Mother   . Obesity Mother   . Stroke Father   . Heart disease  Father   . Alcoholism Father     ROS: Review of Systems  Constitutional: Negative for weight loss.  Cardiovascular:       + Hands and feet swelling  Gastrointestinal: Positive for constipation. Negative for diarrhea, melena, nausea and vomiting.       Negative hematochezia  Endo/Heme/Allergies:       Negative hypoglycemia    PHYSICAL EXAM: Blood pressure (!) 98/49, pulse 64, temperature 98.1 F (36.7 C), temperature source Oral, height 4' 11"  (1.499 m), weight 210 lb (95.3 kg), SpO2 95 %. Body mass index is 42.41 kg/m. Physical Exam  Constitutional: She is oriented to person, place, and time. She appears well-developed and well-nourished.  Cardiovascular: Normal rate.  Pulmonary/Chest: Effort normal.  Musculoskeletal: Normal range of motion.  Neurological: She is oriented to person, place, and time.  Skin: Skin is warm and dry.  Psychiatric: She has a normal mood and affect. Her behavior is normal.  Vitals reviewed.   RECENT LABS AND TESTS: BMET    Component Value Date/Time   NA 140 02/01/2018   K 4.8 02/01/2018   CL 105 09/25/2017 1135   CO2 23 09/25/2017 1135   GLUCOSE 88 09/25/2017 1135   BUN 20 02/01/2018   CREATININE 1.0 02/01/2018   CREATININE 1.13 (H) 09/25/2017 1135   CALCIUM 9.0 09/25/2017 1135   GFRNONAA 50 (L) 09/25/2017 1135   GFRAA 58 (L) 09/25/2017 1135   Lab Results  Component Value Date   HGBA1C 5.7 02/01/2018   HGBA1C 5.6 09/25/2017   HGBA1C 5.7 (H) 05/31/2017   Lab Results  Component Value Date   INSULIN 14.6 09/25/2017   INSULIN 19.4 05/31/2017   CBC    Component Value Date/Time   WBC 5.4 05/31/2017 1158   RBC 4.58 05/31/2017 1158   HGB 13.9 05/31/2017 1158   HCT 41.0 05/31/2017 1158   MCV 90 05/31/2017 1158   MCH 30.3 05/31/2017 1158   MCHC 33.9 05/31/2017 1158   RDW 15.0 05/31/2017 1158   LYMPHSABS 1.8 05/31/2017 1158   EOSABS 0.1 05/31/2017 1158   BASOSABS 0.0 05/31/2017 1158   Iron/TIBC/Ferritin/ %Sat No results found  for: IRON, TIBC, FERRITIN, IRONPCTSAT Lipid Panel     Component Value Date/Time   CHOL 168 02/01/2018   CHOL 165 09/25/2017 1135   TRIG 106 02/01/2018   HDL 71 (A) 02/01/2018   HDL 59 09/25/2017 1135   LDLCALC 76 02/01/2018   LDLCALC 82 09/25/2017 1135   Hepatic Function Panel     Component Value Date/Time   PROT 6.7 09/25/2017 1135   ALBUMIN 4.5 09/25/2017 1135   AST 23 02/01/2018   ALT 11 02/01/2018   ALKPHOS 98 09/25/2017 1135   BILITOT 0.6 09/25/2017 1135  Component Value Date/Time   TSH 1.09 02/01/2018   TSH 0.672 09/25/2017 1135   TSH 1.110 05/31/2017 1158    ASSESSMENT AND PLAN: Other constipation - Plan: polyethylene glycol powder (GLYCOLAX/MIRALAX) powder  Type 2 diabetes mellitus without complication, without long-term current use of insulin ([place]) - Plan: Comprehensive metabolic panel, liraglutide (VICTOZA) 18 MG/3ML SOPN, Insulin Pen Needle (BD PEN NEEDLE NANO U/F) 32G X 4 MM MISC, glucose blood (ACCU-CHEK SMARTVIEW) test strip, ACCU-CHEK FASTCLIX LANCETS MISC  Class 3 severe obesity with serious comorbidity and body mass index (BMI) of 40.0 to 44.9 in adult, unspecified obesity type (HCC)  PLAN:  Constipation Shailee was informed decrease bowel movement frequency is normal while losing weight, but stools should not be hard or painful. She was advised to increase her H20 intake and work on increasing her fiber intake. High fiber foods were discussed today. Kirsten agrees to continue taking miralax 17 g daily and we will refill for 1 month, and she agrees to follow up with our clinic in 2 to 3 weeks.  Diabetes II Teresea has been given extensive diabetes education by myself today including ideal fasting and post-prandial blood glucose readings, individual ideal Hgb A1c goals and hypoglycemia prevention. We discussed the importance of good blood sugar control to decrease the likelihood of diabetic complications such as nephropathy, neuropathy, limb loss,  blindness, coronary artery disease, and death. We discussed the importance of intensive lifestyle modification including diet, exercise and weight loss as the first line treatment for diabetes. Matasha agrees to continue Victoza 0.6 mg q AM #3 pens and we will refill for 1 month. We will refill test strips, nano needles, and lancets for 1 month. Avaley agrees to follow up with our clinic in 2 to 3 weeks.  Obesity Danya is currently in the action stage of change. As such, her goal is to continue with weight loss efforts She has agreed to follow strict Category 2 plan Anicia has been instructed to work up to a goal of 150 minutes of combined cardio and strengthening exercise per week for weight loss and overall health benefits. We discussed the following Behavioral Modification Strategies today: increasing lean protein intake, decreasing simple carbohydrates, increase H20 intake, and dealing with family or coworker sabotage   Merie has agreed to follow up with our clinic in 2 to 3 weeks. She was informed of the importance of frequent follow up visits to maximize her success with intensive lifestyle modifications for her multiple health conditions.   I, Trixie Dredge, am acting as transcriptionist for Dennard Nip, MD  I have reviewed the above documentation for accuracy and completeness, and I agree with the above. -Dennard Nip, MD

## 2018-06-27 ENCOUNTER — Ambulatory Visit (INDEPENDENT_AMBULATORY_CARE_PROVIDER_SITE_OTHER): Payer: Self-pay | Admitting: Family Medicine

## 2018-06-27 DIAGNOSIS — E7800 Pure hypercholesterolemia, unspecified: Secondary | ICD-10-CM | POA: Diagnosis not present

## 2018-06-27 DIAGNOSIS — I10 Essential (primary) hypertension: Secondary | ICD-10-CM | POA: Diagnosis not present

## 2018-06-27 DIAGNOSIS — E559 Vitamin D deficiency, unspecified: Secondary | ICD-10-CM | POA: Diagnosis not present

## 2018-06-27 DIAGNOSIS — E039 Hypothyroidism, unspecified: Secondary | ICD-10-CM | POA: Diagnosis not present

## 2018-06-27 DIAGNOSIS — Z0000 Encounter for general adult medical examination without abnormal findings: Secondary | ICD-10-CM | POA: Diagnosis not present

## 2018-06-27 DIAGNOSIS — E119 Type 2 diabetes mellitus without complications: Secondary | ICD-10-CM | POA: Diagnosis not present

## 2018-06-27 DIAGNOSIS — F3340 Major depressive disorder, recurrent, in remission, unspecified: Secondary | ICD-10-CM | POA: Diagnosis not present

## 2018-06-27 DIAGNOSIS — N183 Chronic kidney disease, stage 3 (moderate): Secondary | ICD-10-CM | POA: Diagnosis not present

## 2018-06-28 ENCOUNTER — Ambulatory Visit (INDEPENDENT_AMBULATORY_CARE_PROVIDER_SITE_OTHER): Payer: Self-pay | Admitting: Family Medicine

## 2018-06-30 DIAGNOSIS — R0602 Shortness of breath: Secondary | ICD-10-CM | POA: Diagnosis not present

## 2018-07-11 ENCOUNTER — Other Ambulatory Visit
Admission: RE | Admit: 2018-07-11 | Discharge: 2018-07-11 | Disposition: A | Discharge status: Home / Self Care | Payer: Medicare HMO | Source: Ambulatory Visit | Attending: Pediatrics | Admitting: Pediatrics

## 2018-07-11 ENCOUNTER — Other Ambulatory Visit: Payer: Self-pay | Admitting: Student

## 2018-07-11 ENCOUNTER — Ambulatory Visit: Payer: Self-pay

## 2018-07-11 ENCOUNTER — Ambulatory Visit (INDEPENDENT_AMBULATORY_CARE_PROVIDER_SITE_OTHER): Payer: Medicare HMO | Admitting: Family Medicine

## 2018-07-11 VITALS — BP 109/64 | HR 75 | Temp 98.2°F | Ht 59.0 in | Wt 209.0 lb

## 2018-07-11 DIAGNOSIS — G4734 Idiopathic sleep related nonobstructive alveolar hypoventilation: Secondary | ICD-10-CM | POA: Diagnosis not present

## 2018-07-11 DIAGNOSIS — R2242 Localized swelling, mass and lump, left lower limb: Secondary | ICD-10-CM | POA: Diagnosis not present

## 2018-07-11 DIAGNOSIS — R2240 Localized swelling, mass and lump, unspecified lower limb: Secondary | ICD-10-CM

## 2018-07-11 DIAGNOSIS — E119 Type 2 diabetes mellitus without complications: Secondary | ICD-10-CM | POA: Diagnosis not present

## 2018-07-11 DIAGNOSIS — M7989 Other specified soft tissue disorders: Secondary | ICD-10-CM | POA: Insufficient documentation

## 2018-07-11 DIAGNOSIS — E559 Vitamin D deficiency, unspecified: Secondary | ICD-10-CM | POA: Diagnosis not present

## 2018-07-11 DIAGNOSIS — Z6841 Body Mass Index (BMI) 40.0 and over, adult: Secondary | ICD-10-CM

## 2018-07-11 DIAGNOSIS — E66813 Obesity, class 3: Secondary | ICD-10-CM

## 2018-07-11 DIAGNOSIS — R0609 Other forms of dyspnea: Secondary | ICD-10-CM | POA: Diagnosis not present

## 2018-07-11 DIAGNOSIS — I8393 Asymptomatic varicose veins of bilateral lower extremities: Secondary | ICD-10-CM | POA: Diagnosis not present

## 2018-07-11 DIAGNOSIS — J449 Chronic obstructive pulmonary disease, unspecified: Secondary | ICD-10-CM | POA: Diagnosis not present

## 2018-07-11 LAB — FIBRIN DERIVATIVES D-DIMER (ARMC ONLY): Fibrin derivatives D-dimer (ARMC): 3018.05 ng/mL (FEU) — ABNORMAL HIGH (ref 0.00–499.00)

## 2018-07-11 MED ORDER — VITAMIN D (ERGOCALCIFEROL) 1.25 MG (50000 UNIT) PO CAPS: 50000.0000 [IU] | ORAL_CAPSULE | ORAL | 0 refills | Status: DC

## 2018-07-12 ENCOUNTER — Ambulatory Visit
Admission: RE | Admit: 2018-07-12 | Discharge: 2018-07-12 | Disposition: A | Discharge status: Home / Self Care | Payer: Medicare HMO | Source: Ambulatory Visit | Attending: Family Medicine | Admitting: Family Medicine

## 2018-07-12 DIAGNOSIS — R2240 Localized swelling, mass and lump, unspecified lower limb: Secondary | ICD-10-CM | POA: Diagnosis not present

## 2018-07-12 DIAGNOSIS — I8002 Phlebitis and thrombophlebitis of superficial vessels of left lower extremity: Secondary | ICD-10-CM | POA: Diagnosis not present

## 2018-07-12 NOTE — Progress Notes (Signed)
Office: [phone]  /  Fax: [phone]   HPI:   Chief Complaint: OBESITY Jenny Thomas is here to discuss her progress with her obesity treatment plan. She is on the Category 2 plan and is following her eating plan approximately 50 % of the time. She states she is exercising 0 minutes 0 times per week. Jenny Thomas did a good job with weight loss. She reports that there are times that she does not get all of her food in during the day. She is traveling to [place] next month and is getting to practice portion control and Designer, fashion/clothing food choices. Her weight is 209 lb (94.8 kg) today and has had a weight loss of 1 pound over a period of 2 weeks since her last visit. She has lost 8 lbs since starting treatment with [name].  Vitamin D deficiency Jenny Thomas has a diagnosis of vitamin D deficiency. She is on prescription vit D and her last vitamin D level was not at goal.  Jenny Thomas denies nausea, vomiting or muscle weakness.  Diabetes II Jenny Thomas has a diagnosis of diabetes type II. Jenny Thomas denies any hypoglycemic episodes. A1c is well controlled on Victoza. Jenny Thomas denies polyphagia or hypoglycemia. She denies nausea, vomiting or diarrhea. She has been working on intensive lifestyle modifications including diet, exercise, and weight loss to help control her blood glucose levels.  ALLERGIES: Allergies  Allergen Reactions  . Percocet [Oxycodone-Acetaminophen]     MEDICATIONS: Current Outpatient Medications on File Prior to Visit  Medication Sig Dispense Refill  . ACCU-CHEK FASTCLIX LANCETS MISC 1 each by Does not apply route 2 (two) times daily. 100 each 0  . albuterol (ACCUNEB) 0.63 MG/3ML nebulizer solution Take 1 ampule by nebulization every 6 (six) hours as needed for wheezing.    [name] albuterol (PROVENTIL HFA;VENTOLIN HFA) 108 (90 Base) MCG/ACT inhaler Inhale into the lungs every 6 (six) hours as needed for wheezing or shortness of breath.    [name] alendronate (FOSAMAX) 70 MG tablet Take 70 mg by  mouth once a week. Take with a full glass of water on an empty stomach.    . Blood Glucose Monitoring Suppl (ACCU-CHEK NANO SMARTVIEW) w/Device KIT 1 each by Does not apply route daily. Check blood sugar twice a day. 1 kit 0  . Calcium Carb-Cholecalciferol (CALCIUM/VITAMIN D) 500-200 MG-UNIT TABS Take by mouth daily. Take one tablet by mouth once a day    . citalopram (CELEXA) 40 MG tablet Take 40 mg by mouth daily.    . Fluticasone-Salmeterol (ADVAIR) 250-50 MCG/DOSE AEPB Inhale 1 puff into the lungs 2 (two) times daily.    . furosemide (LASIX) 20 MG tablet Take 20 mg by mouth daily as needed.    [name] glucose blood (ACCU-CHEK SMARTVIEW) test strip USE 1 STRIP TO CHECK GLUCOSE TWICE DAILY 100 each 0  . Insulin Pen Needle (BD PEN NEEDLE NANO U/F) 32G X 4 MM MISC 1 each by Does not apply route daily. 100 each 0  . levothyroxine (SYNTHROID, LEVOTHROID) 88 MCG tablet Take 88 mcg by mouth daily before breakfast.    . liraglutide (VICTOZA) 18 MG/3ML SOPN Inject 0.1 mLs (0.6 mg total) into the skin every morning. 3 pen 0  . losartan (COZAAR) 50 MG tablet Take 1 tablet (50 mg total) by mouth daily. 30 tablet 0  . montelukast (SINGULAIR) 10 MG tablet Take 10 mg by mouth at bedtime.    . polyethylene glycol powder (GLYCOLAX/MIRALAX) powder Take 17 g by mouth daily. 3350 g 0  .  potassium chloride SA (K-DUR,KLOR-CON) 20 MEQ tablet Take 20 mEq by mouth once.    . simvastatin (ZOCOR) 20 MG tablet Take 20 mg by mouth daily.     No current facility-administered medications on file prior to visit.     PAST MEDICAL HISTORY: Past Medical History:  Diagnosis Date  . Anxiety   . Asthma   . Back pain   . Constipation   . COPD (chronic obstructive pulmonary disease) ([place])   . Depression   . Dyspnea   . Gallbladder problem   . Hyperlipidemia   . Hypertension   . Hypothyroid   . Joint pain   . Leg edema   . Prediabetes     PAST SURGICAL HISTORY: Past Surgical History:  Procedure Laterality Date  .  ABDOMINAL HYSTERECTOMY    . CHOLECYSTECTOMY    . hernia repair    . LAPAROSCOPIC GASTRIC BANDING      SOCIAL HISTORY: Social History   Tobacco Use  . Smoking status: Never Smoker  . Smokeless tobacco: Never Used  Substance Use Topics  . Alcohol use: Not on file  . Drug use: Not on file    FAMILY HISTORY: Family History  Problem Relation Age of Onset  . Breast cancer Maternal Aunt   . Hypertension Mother   . Kidney disease Mother   . Obesity Mother   . Stroke Father   . Heart disease Father   . Alcoholism Father     ROS: Review of Systems  Constitutional: Positive for weight loss.  Gastrointestinal: Negative for diarrhea, nausea and vomiting.  Musculoskeletal:       Negative for muscle weakness  Endo/Heme/Allergies:       Negative for polyphagia Negative for hypoglycemia    PHYSICAL EXAM: Blood pressure 109/64, pulse 75, temperature 98.2 F (36.8 C), temperature source Oral, height 4' 11" (1.499 m), weight 209 lb (94.8 kg), SpO2 96 %. Body mass index is 42.21 kg/m. Physical Exam  Constitutional: She is oriented to person, place, and time. She appears well-developed and well-nourished.  Cardiovascular: Normal rate.  Pulmonary/Chest: Effort normal.  Musculoskeletal: Normal range of motion.  Neurological: She is oriented to person, place, and time.  Skin: Skin is warm and dry.  Psychiatric: She has a normal mood and affect. Her behavior is normal.  Vitals reviewed.   RECENT LABS AND TESTS: BMET    Component Value Date/Time   NA 140 02/01/2018   K 4.8 02/01/2018   CL 105 09/25/2017 1135   CO2 23 09/25/2017 1135   GLUCOSE 88 09/25/2017 1135   BUN 20 02/01/2018   CREATININE 1.0 02/01/2018   CREATININE 1.13 (H) 09/25/2017 1135   CALCIUM 9.0 09/25/2017 1135   GFRNONAA 50 (L) 09/25/2017 1135   GFRAA 58 (L) 09/25/2017 1135   Lab Results  Component Value Date   HGBA1C 5.7 02/01/2018   HGBA1C 5.6 09/25/2017   HGBA1C 5.7 (H) 05/31/2017   Lab Results    Component Value Date   INSULIN 14.6 09/25/2017   INSULIN 19.4 05/31/2017   CBC    Component Value Date/Time   WBC 5.4 05/31/2017 1158   RBC 4.58 05/31/2017 1158   HGB 13.9 05/31/2017 1158   HCT 41.0 05/31/2017 1158   MCV 90 05/31/2017 1158   MCH 30.3 05/31/2017 1158   MCHC 33.9 05/31/2017 1158   RDW 15.0 05/31/2017 1158   LYMPHSABS 1.8 05/31/2017 1158   EOSABS 0.1 05/31/2017 1158   BASOSABS 0.0 05/31/2017 1158   Iron/TIBC/Ferritin/ %Sat No  results found for: IRON, TIBC, FERRITIN, IRONPCTSAT Lipid Panel     Component Value Date/Time   CHOL 168 02/01/2018   CHOL 165 09/25/2017 1135   TRIG 106 02/01/2018   HDL 71 (A) 02/01/2018   HDL 59 09/25/2017 1135   LDLCALC 76 02/01/2018   LDLCALC 82 09/25/2017 1135   Hepatic Function Panel     Component Value Date/Time   PROT 6.7 09/25/2017 1135   ALBUMIN 4.5 09/25/2017 1135   AST 23 02/01/2018   ALT 11 02/01/2018   ALKPHOS 98 09/25/2017 1135   BILITOT 0.6 09/25/2017 1135      Component Value Date/Time   TSH 1.09 02/01/2018   TSH 0.672 09/25/2017 1135   TSH 1.110 05/31/2017 1158   Results for LORENA, BENHAM (MRN [id number]) as of 07/12/2018 15:18  Ref. Range 09/25/2017 11:35  Vitamin D, 25-Hydroxy Latest Ref Range: 30.0 - 100.0 ng/mL 36.6   ASSESSMENT AND PLAN: Vitamin D deficiency - Plan: Vitamin D, Ergocalciferol, (DRISDOL) 50000 units CAPS capsule  Type 2 diabetes mellitus without complication, without long-term current use of insulin (HCC)  Class 3 severe obesity with serious comorbidity and body mass index (BMI) of 40.0 to 44.9 in adult, unspecified obesity type ([place])  PLAN:  Vitamin D Deficiency Wanona was informed that low vitamin D levels contributes to fatigue and are associated with obesity, breast, and colon cancer. She agrees to continue to take prescription Vit D _0 ,000 IU every week #4 with no refills and will follow up for routine testing of vitamin D, at least 2-3 times per year. She was informed  of the risk of over-replacement of vitamin D and agrees to not increase her dose unless she discusses this with [name] first. Chelesa agrees to follow up as directed.  Diabetes II Reis has been given extensive diabetes education by myself today including ideal fasting and post-prandial blood glucose readings, individual ideal Hgb A1c goals and hypoglycemia prevention. We discussed the importance of good blood sugar control to decrease the likelihood of diabetic complications such as nephropathy, neuropathy, limb loss, blindness, coronary artery disease, and death. We discussed the importance of intensive lifestyle modification including diet, exercise and weight loss as the first line treatment for diabetes. Elonda agrees to continue Victoza, diet, exercise and weight loss. Lillymae will follow up at the agreed upon time.  Obesity Lesta is currently in the action stage of change. As such, her goal is to continue with weight loss efforts She has agreed to follow the Category 1 plan Takiah has been instructed to work up to a goal of 150 minutes of combined cardio and strengthening exercise per week for weight loss and overall health benefits. We discussed the following Behavioral Modification Strategies today: no skipping meals and work on meal planning and easy cooking plans  Lizzie has agreed to follow up with our clinic in 5 to 6 weeks. She was informed of the importance of frequent follow up visits to maximize her success with intensive lifestyle modifications for her multiple health conditions.  I, Doreene Nest, am acting as transcriptionist for Dennard Nip, MD  I have reviewed the above documentation for accuracy and completeness, and I agree with the above. -Dennard Nip, MD

## 2018-07-31 DIAGNOSIS — R0602 Shortness of breath: Secondary | ICD-10-CM | POA: Diagnosis not present

## 2018-08-20 ENCOUNTER — Ambulatory Visit (INDEPENDENT_AMBULATORY_CARE_PROVIDER_SITE_OTHER): Payer: Medicare HMO | Admitting: Family Medicine

## 2018-08-23 ENCOUNTER — Ambulatory Visit (INDEPENDENT_AMBULATORY_CARE_PROVIDER_SITE_OTHER): Payer: Medicare HMO | Admitting: Family Medicine

## 2018-08-23 VITALS — BP 140/88 | HR 114 | Temp 98.5°F | Ht 59.0 in | Wt 216.0 lb

## 2018-08-23 DIAGNOSIS — Z6841 Body Mass Index (BMI) 40.0 and over, adult: Secondary | ICD-10-CM

## 2018-08-23 DIAGNOSIS — E559 Vitamin D deficiency, unspecified: Secondary | ICD-10-CM

## 2018-08-23 MED ORDER — VITAMIN D (ERGOCALCIFEROL) 1.25 MG (50000 UNIT) PO CAPS: 50000.0000 [IU] | ORAL_CAPSULE | ORAL | 0 refills | Status: DC

## 2018-08-27 NOTE — Progress Notes (Signed)
Office: [phone]  /  Fax: [phone]   HPI:   Chief Complaint: OBESITY Jenny Thomas is here to discuss her progress with her obesity treatment plan. She is on the Category 1 plan and is following her eating plan approximately 25 % of the time. She states she walked a lot on vacation. Jenny Thomas has been on vacation for 1 month and ate ice cream every day. She is ready to get back on track with her Category 1 plan.  Her weight is 216 lb (98 kg) today and has gained 7 pounds since her last visit. She has lost 1 lb since starting treatment with [name].  Vitamin D Deficiency Jenny Thomas has a diagnosis of vitamin D deficiency. She is stable on prescription Vit D and denies nausea, vomiting or muscle weakness.  ALLERGIES: Allergies  Allergen Reactions  . Percocet [Oxycodone-Acetaminophen]     MEDICATIONS: Current Outpatient Medications on File Prior to Visit  Medication Sig Dispense Refill  . ACCU-CHEK FASTCLIX LANCETS MISC 1 each by Does not apply route 2 (two) times daily. 100 each 0  . albuterol (ACCUNEB) 0.63 MG/3ML nebulizer solution Take 1 ampule by nebulization every 6 (six) hours as needed for wheezing.    [name] albuterol (PROVENTIL HFA;VENTOLIN HFA) 108 (90 Base) MCG/ACT inhaler Inhale into the lungs every 6 (six) hours as needed for wheezing or shortness of breath.    [name] alendronate (FOSAMAX) 70 MG tablet Take 70 mg by mouth once a week. Take with a full glass of water on an empty stomach.    . Blood Glucose Monitoring Suppl (ACCU-CHEK NANO SMARTVIEW) w/Device KIT 1 each by Does not apply route daily. Check blood sugar twice a day. 1 kit 0  . Calcium Carb-Cholecalciferol (CALCIUM/VITAMIN D) 500-200 MG-UNIT TABS Take by mouth daily. Take one tablet by mouth once a day    . citalopram (CELEXA) 40 MG tablet Take 40 mg by mouth daily.    . Fluticasone-Salmeterol (ADVAIR) 250-50 MCG/DOSE AEPB Inhale 1 puff into the lungs 2 (two) times daily.    . furosemide (LASIX) 20 MG tablet Take 20 mg by mouth  daily as needed.    [name] glucose blood (ACCU-CHEK SMARTVIEW) test strip USE 1 STRIP TO CHECK GLUCOSE TWICE DAILY 100 each 0  . Insulin Pen Needle (BD PEN NEEDLE NANO U/F) 32G X 4 MM MISC 1 each by Does not apply route daily. 100 each 0  . levothyroxine (SYNTHROID, LEVOTHROID) 88 MCG tablet Take 88 mcg by mouth daily before breakfast.    . liraglutide (VICTOZA) 18 MG/3ML SOPN Inject 0.1 mLs (0.6 mg total) into the skin every morning. 3 pen 0  . losartan (COZAAR) 50 MG tablet Take 1 tablet (50 mg total) by mouth daily. 30 tablet 0  . montelukast (SINGULAIR) 10 MG tablet Take 10 mg by mouth at bedtime.    . polyethylene glycol powder (GLYCOLAX/MIRALAX) powder Take 17 g by mouth daily. 3350 g 0  . potassium chloride SA (K-DUR,KLOR-CON) 20 MEQ tablet Take 20 mEq by mouth once.    . simvastatin (ZOCOR) 20 MG tablet Take 20 mg by mouth daily.     No current facility-administered medications on file prior to visit.     PAST MEDICAL HISTORY: Past Medical History:  Diagnosis Date  . Anxiety   . Asthma   . Back pain   . Constipation   . COPD (chronic obstructive pulmonary disease) ([place])   . Depression   . Dyspnea   . Gallbladder problem   . Hyperlipidemia   .  Hypertension   . Hypothyroid   . Joint pain   . Leg edema   . Prediabetes     PAST SURGICAL HISTORY: Past Surgical History:  Procedure Laterality Date  . ABDOMINAL HYSTERECTOMY    . CHOLECYSTECTOMY    . hernia repair    . LAPAROSCOPIC GASTRIC BANDING      SOCIAL HISTORY: Social History   Tobacco Use  . Smoking status: Never Smoker  . Smokeless tobacco: Never Used  Substance Use Topics  . Alcohol use: Not on file  . Drug use: Not on file    FAMILY HISTORY: Family History  Problem Relation Age of Onset  . Breast cancer Maternal Aunt   . Hypertension Mother   . Kidney disease Mother   . Obesity Mother   . Stroke Father   . Heart disease Father   . Alcoholism Father     ROS: Review of Systems  Constitutional:  Negative for weight loss.  Gastrointestinal: Negative for nausea and vomiting.  Musculoskeletal:       Negative muscle weakness    PHYSICAL EXAM: Blood pressure 140/88, pulse (!) 114, temperature 98.5 F (36.9 C), temperature source Oral, height '4\' 11"'$  (1.499 m), weight 216 lb (98 kg), SpO2 97 %. Body mass index is 43.63 kg/m. Physical Exam  Constitutional: She is oriented to person, place, and time. She appears well-developed and well-nourished.  Cardiovascular: Normal rate.  Pulmonary/Chest: Effort normal.  Musculoskeletal: Normal range of motion.  Neurological: She is oriented to person, place, and time.  Skin: Skin is warm and dry.  Psychiatric: She has a normal mood and affect. Her behavior is normal.  Vitals reviewed.   RECENT LABS AND TESTS: BMET    Component Value Date/Time   NA 140 02/01/2018   K 4.8 02/01/2018   CL 105 09/25/2017 1135   CO2 23 09/25/2017 1135   GLUCOSE 88 09/25/2017 1135   BUN 20 02/01/2018   CREATININE 1.0 02/01/2018   CREATININE 1.13 (H) 09/25/2017 1135   CALCIUM 9.0 09/25/2017 1135   GFRNONAA 50 (L) 09/25/2017 1135   GFRAA 58 (L) 09/25/2017 1135   Lab Results  Component Value Date   HGBA1C 5.7 02/01/2018   HGBA1C 5.6 09/25/2017   HGBA1C 5.7 (H) 05/31/2017   Lab Results  Component Value Date   INSULIN 14.6 09/25/2017   INSULIN 19.4 05/31/2017   CBC    Component Value Date/Time   WBC 5.4 05/31/2017 1158   RBC 4.58 05/31/2017 1158   HGB 13.9 05/31/2017 1158   HCT 41.0 05/31/2017 1158   MCV 90 05/31/2017 1158   MCH 30.3 05/31/2017 1158   MCHC 33.9 05/31/2017 1158   RDW 15.0 05/31/2017 1158   LYMPHSABS 1.8 05/31/2017 1158   EOSABS 0.1 05/31/2017 1158   BASOSABS 0.0 05/31/2017 1158   Iron/TIBC/Ferritin/ %Sat No results found for: IRON, TIBC, FERRITIN, IRONPCTSAT Lipid Panel     Component Value Date/Time   CHOL 168 02/01/2018   CHOL 165 09/25/2017 1135   TRIG 106 02/01/2018   HDL 71 (A) 02/01/2018   HDL 59 09/25/2017 1135    LDLCALC 76 02/01/2018   LDLCALC 82 09/25/2017 1135   Hepatic Function Panel     Component Value Date/Time   PROT 6.7 09/25/2017 1135   ALBUMIN 4.5 09/25/2017 1135   AST 23 02/01/2018   ALT 11 02/01/2018   ALKPHOS 98 09/25/2017 1135   BILITOT 0.6 09/25/2017 1135      Component Value Date/Time   TSH 1.09 02/01/2018  TSH 0.672 09/25/2017 1135   TSH 1.110 05/31/2017 1158  Results for MALISSA, SLAY (MRN [id number]) as of 08/27/2018 13:51  Ref. Range 09/25/2017 11:35  Vitamin D, 25-Hydroxy Latest Ref Range: 30.0 - 100.0 ng/mL 36.6    ASSESSMENT AND PLAN: Vitamin D deficiency - Plan: Vitamin D, Ergocalciferol, (DRISDOL) 50000 units CAPS capsule  Class 3 severe obesity with serious comorbidity and body mass index (BMI) of 40.0 to 44.9 in adult, unspecified obesity type (HCC)  PLAN:  Vitamin D Deficiency Jenny Thomas was informed that low vitamin D levels contributes to fatigue and are associated with obesity, breast, and colon cancer. Jenny Thomas agrees to continue taking prescription Vit D _0 ,000 IU every week #4 and we will refill for 1 month. She will follow up for routine testing of vitamin D, at least 2-3 times per year. She was informed of the risk of over-replacement of vitamin D and agrees to not increase her dose unless she discusses this with [name] first. Jenny Thomas agrees to follow up with our clinic in 3 weeks.  Obesity Jenny Thomas is currently in the action stage of change. As such, her goal is to continue with weight loss efforts She has agreed to follow the Category 1 plan Jenny Thomas has been instructed to work up to a goal of 150 minutes of combined cardio and strengthening exercise per week for weight loss and overall health benefits. We discussed the following Behavioral Modification Strategies today: increasing lean protein intake, increasing vegetables, work on meal planning and easy cooking plans and emotional eating strategies   Jenny Thomas has agreed to follow up with our clinic  in 3 weeks. She was informed of the importance of frequent follow up visits to maximize her success with intensive lifestyle modifications for her multiple health conditions.   OBESITY BEHAVIORAL INTERVENTION VISIT  Today's visit was # 26   Starting weight: 217 lbs Starting date: 05/31/17 Today's weight : 216 lbs Today's date: 08/23/2018 Total lbs lost to date: 1 At least 15 minutes were spent on discussing the following behavioral intervention visit.   ASK: We discussed the diagnosis of obesity with Jenny Thomas today and Jenny Thomas agreed to give [name] permission to discuss obesity behavioral modification therapy today.  ASSESS: Jenny Thomas has the diagnosis of obesity and her BMI today is 43.6 Jenny Thomas is in the action stage of change   ADVISE: Jenny Thomas was educated on the multiple health risks of obesity as well as the benefit of weight loss to improve her health. She was advised of the need for long term treatment and the importance of lifestyle modifications to improve her current health and to decrease her risk of future health problems.  AGREE: Multiple dietary modification options and treatment options were discussed and  Jenny Thomas agreed to follow the recommendations documented in the above note.  ARRANGE: Jenny Thomas was educated on the importance of frequent visits to treat obesity as outlined per CMS and USPSTF guidelines and agreed to schedule her next follow up appointment today.  I, Trixie Dredge, am acting as transcriptionist for Dennard Nip, MD  I have reviewed the above documentation for accuracy and completeness, and I agree with the above. -Dennard Nip, MD

## 2018-08-31 DIAGNOSIS — R0602 Shortness of breath: Secondary | ICD-10-CM | POA: Diagnosis not present

## 2018-09-03 DIAGNOSIS — H2512 Age-related nuclear cataract, left eye: Secondary | ICD-10-CM | POA: Diagnosis not present

## 2018-09-04 DIAGNOSIS — H2511 Age-related nuclear cataract, right eye: Secondary | ICD-10-CM | POA: Diagnosis not present

## 2018-09-12 ENCOUNTER — Encounter (INDEPENDENT_AMBULATORY_CARE_PROVIDER_SITE_OTHER): Payer: Self-pay | Admitting: Physician Assistant

## 2018-09-12 ENCOUNTER — Ambulatory Visit (INDEPENDENT_AMBULATORY_CARE_PROVIDER_SITE_OTHER): Payer: Medicare HMO | Admitting: Physician Assistant

## 2018-09-12 VITALS — BP 103/68 | HR 71 | Temp 98.4°F | Ht 59.0 in | Wt 214.0 lb

## 2018-09-12 DIAGNOSIS — Z9189 Other specified personal risk factors, not elsewhere classified: Secondary | ICD-10-CM

## 2018-09-12 DIAGNOSIS — E559 Vitamin D deficiency, unspecified: Secondary | ICD-10-CM

## 2018-09-12 DIAGNOSIS — Z6841 Body Mass Index (BMI) 40.0 and over, adult: Secondary | ICD-10-CM | POA: Diagnosis not present

## 2018-09-12 DIAGNOSIS — E119 Type 2 diabetes mellitus without complications: Secondary | ICD-10-CM

## 2018-09-12 MED ORDER — VITAMIN D (ERGOCALCIFEROL) 1.25 MG (50000 UNIT) PO CAPS: 50000.0000 [IU] | ORAL_CAPSULE | ORAL | 0 refills | Status: DC

## 2018-09-12 NOTE — Progress Notes (Signed)
Office: [phone]  /  Fax: [phone]   HPI:   Chief Complaint: OBESITY Jenny Thomas is here to discuss her progress with her obesity treatment plan. She is on the Category 1 plan and is following her eating plan approximately 50 % of the time. She states she is exercising 0 minutes 0 times per week. Jenny Thomas did well with weight loss. She reports financial difficulties and she is struggling with buying all of the food for the plan. She also reports that she has not been able to cook because her well has no more water currently. Her weight is 214 lb (97.1 kg) today and has had a weight loss of 2 pounds over a period of 3 weeks since her last visit. She has lost 3 lbs since starting treatment with [name].  Vitamin D Deficiency Jenny Thomas has a diagnosis of vitamin D deficiency. She is currently taking prescription Vit D. She denies nausea, vomiting or muscle weakness. Jenny Thomas has had lab work done with her primary care physician on 06/29/18, and she is scheduled for more labs on 10/30/18.  At risk for osteopenia and osteoporosis Jenny Thomas is at higher risk of osteopenia and osteoporosis due to vitamin D deficiency.   Diabetes II Jenny Thomas has a diagnosis of diabetes type II. Jenny Thomas is on Victoza and she denies nausea, vomiting, or diarrhea. She denies any hypoglycemic episodes. She has been working on intensive lifestyle modifications including diet, exercise, and weight loss to help control her blood glucose levels.  ALLERGIES: Allergies  Allergen Reactions  . Percocet [Oxycodone-Acetaminophen]     MEDICATIONS: Current Outpatient Medications on File Prior to Visit  Medication Sig Dispense Refill  . ACCU-CHEK FASTCLIX LANCETS MISC 1 each by Does not apply route 2 (two) times daily. 100 each 0  . albuterol (ACCUNEB) 0.63 MG/3ML nebulizer solution Take 1 ampule by nebulization every 6 (six) hours as needed for wheezing.    [name] albuterol (PROVENTIL HFA;VENTOLIN HFA) 108 (90 Base) MCG/ACT inhaler  Inhale into the lungs every 6 (six) hours as needed for wheezing or shortness of breath.    [name] alendronate (FOSAMAX) 70 MG tablet Take 70 mg by mouth once a week. Take with a full glass of water on an empty stomach.    . Blood Glucose Monitoring Suppl (ACCU-CHEK NANO SMARTVIEW) w/Device KIT 1 each by Does not apply route daily. Check blood sugar twice a day. 1 kit 0  . Calcium Carb-Cholecalciferol (CALCIUM/VITAMIN D) 500-200 MG-UNIT TABS Take by mouth daily. Take one tablet by mouth once a day    . citalopram (CELEXA) 40 MG tablet Take 40 mg by mouth daily.    . Fluticasone-Salmeterol (ADVAIR) 250-50 MCG/DOSE AEPB Inhale 1 puff into the lungs 2 (two) times daily.    . furosemide (LASIX) 20 MG tablet Take 20 mg by mouth daily as needed.    [name] glucose blood (ACCU-CHEK SMARTVIEW) test strip USE 1 STRIP TO CHECK GLUCOSE TWICE DAILY 100 each 0  . Insulin Pen Needle (BD PEN NEEDLE NANO U/F) 32G X 4 MM MISC 1 each by Does not apply route daily. 100 each 0  . levothyroxine (SYNTHROID, LEVOTHROID) 88 MCG tablet Take 88 mcg by mouth daily before breakfast.    . liraglutide (VICTOZA) 18 MG/3ML SOPN Inject 0.1 mLs (0.6 mg total) into the skin every morning. 3 pen 0  . losartan (COZAAR) 50 MG tablet Take 1 tablet (50 mg total) by mouth daily. 30 tablet 0  . montelukast (SINGULAIR) 10 MG tablet Take 10 mg by  mouth at bedtime.    . polyethylene glycol powder (GLYCOLAX/MIRALAX) powder Take 17 g by mouth daily. 3350 g 0  . potassium chloride SA (K-DUR,KLOR-CON) 20 MEQ tablet Take 20 mEq by mouth once.    . simvastatin (ZOCOR) 20 MG tablet Take 20 mg by mouth daily.     No current facility-administered medications on file prior to visit.     PAST MEDICAL HISTORY: Past Medical History:  Diagnosis Date  . Anxiety   . Asthma   . Back pain   . Constipation   . COPD (chronic obstructive pulmonary disease) ([place])   . Depression   . Dyspnea   . Gallbladder problem   . Hyperlipidemia   . Hypertension   .  Hypothyroid   . Joint pain   . Leg edema   . Prediabetes     PAST SURGICAL HISTORY: Past Surgical History:  Procedure Laterality Date  . ABDOMINAL HYSTERECTOMY    . CHOLECYSTECTOMY    . hernia repair    . LAPAROSCOPIC GASTRIC BANDING      SOCIAL HISTORY: Social History   Tobacco Use  . Smoking status: Never Smoker  . Smokeless tobacco: Never Used  Substance Use Topics  . Alcohol use: Not on file  . Drug use: Not on file    FAMILY HISTORY: Family History  Problem Relation Age of Onset  . Breast cancer Maternal Aunt   . Hypertension Mother   . Kidney disease Mother   . Obesity Mother   . Stroke Father   . Heart disease Father   . Alcoholism Father     ROS: Review of Systems  Constitutional: Positive for weight loss.  Gastrointestinal: Negative for diarrhea, nausea and vomiting.  Musculoskeletal:       Negative muscle weakness  Endo/Heme/Allergies:       Negative hypoglycemia    PHYSICAL EXAM: Blood pressure 103/68, pulse 71, temperature 98.4 F (36.9 C), temperature source Oral, height 4' 11"  (1.499 m), weight 214 lb (97.1 kg), SpO2 96 %. Body mass index is 43.22 kg/m. Physical Exam  Constitutional: She is oriented to person, place, and time. She appears well-developed and well-nourished.  Cardiovascular: Normal rate.  Pulmonary/Chest: Effort normal.  Musculoskeletal: Normal range of motion.  Neurological: She is oriented to person, place, and time.  Skin: Skin is warm and dry.  Psychiatric: She has a normal mood and affect. Her behavior is normal.  Vitals reviewed.   RECENT LABS AND TESTS: BMET    Component Value Date/Time   NA 140 02/01/2018   K 4.8 02/01/2018   CL 105 09/25/2017 1135   CO2 23 09/25/2017 1135   GLUCOSE 88 09/25/2017 1135   BUN 20 02/01/2018   CREATININE 1.0 02/01/2018   CREATININE 1.13 (H) 09/25/2017 1135   CALCIUM 9.0 09/25/2017 1135   GFRNONAA 50 (L) 09/25/2017 1135   GFRAA 58 (L) 09/25/2017 1135   Lab Results    Component Value Date   HGBA1C 5.7 02/01/2018   HGBA1C 5.6 09/25/2017   HGBA1C 5.7 (H) 05/31/2017   Lab Results  Component Value Date   INSULIN 14.6 09/25/2017   INSULIN 19.4 05/31/2017   CBC    Component Value Date/Time   WBC 5.4 05/31/2017 1158   RBC 4.58 05/31/2017 1158   HGB 13.9 05/31/2017 1158   HCT 41.0 05/31/2017 1158   MCV 90 05/31/2017 1158   MCH 30.3 05/31/2017 1158   MCHC 33.9 05/31/2017 1158   RDW 15.0 05/31/2017 1158   LYMPHSABS 1.8 05/31/2017  1158   EOSABS 0.1 05/31/2017 1158   BASOSABS 0.0 05/31/2017 1158   Iron/TIBC/Ferritin/ %Sat No results found for: IRON, TIBC, FERRITIN, IRONPCTSAT Lipid Panel     Component Value Date/Time   CHOL 168 02/01/2018   CHOL 165 09/25/2017 1135   TRIG 106 02/01/2018   HDL 71 (A) 02/01/2018   HDL 59 09/25/2017 1135   LDLCALC 76 02/01/2018   LDLCALC 82 09/25/2017 1135   Hepatic Function Panel     Component Value Date/Time   PROT 6.7 09/25/2017 1135   ALBUMIN 4.5 09/25/2017 1135   AST 23 02/01/2018   ALT 11 02/01/2018   ALKPHOS 98 09/25/2017 1135   BILITOT 0.6 09/25/2017 1135      Component Value Date/Time   TSH 1.09 02/01/2018   TSH 0.672 09/25/2017 1135   TSH 1.110 05/31/2017 1158  Results for CLYTEE, HEINRICH (MRN [id number]) as of 09/12/2018 15:09  Ref. Range 09/25/2017 11:35  Vitamin D, 25-Hydroxy Latest Ref Range: 30.0 - 100.0 ng/mL 36.6    ASSESSMENT AND PLAN: Vitamin D deficiency - Plan: Vitamin D, Ergocalciferol, (DRISDOL) 50000 units CAPS capsule  Type 2 diabetes mellitus without complication, without long-term current use of insulin (HCC)  At risk for osteoporosis  Class 3 severe obesity with serious comorbidity and body mass index (BMI) of 40.0 to 44.9 in adult, unspecified obesity type ([place])  PLAN:  Vitamin D Deficiency Starlette was informed that low vitamin D levels contributes to fatigue and are associated with obesity, breast, and colon cancer. Chelcee agrees to continue taking  prescription Vit D @50 ,000 IU every week #4 and we will refill for 1 month. She will follow up for routine testing of vitamin D, at least 2-3 times per year. She was informed of the risk of over-replacement of vitamin D and agrees to not increase her dose unless she discusses this with [name] first. Jenny Thomas agrees to follow up with our clinic in 4 weeks.  At risk for osteopenia and osteoporosis Jenny Thomas was given extended  (15 minutes) osteoporosis prevention counseling today. Jenny Thomas is at risk for osteopenia and osteoporsis due to her vitamin D deficiency. She was encouraged to take her vitamin D and follow her higher calcium diet and increase strengthening exercise to help strengthen her bones and decrease her risk of osteopenia and osteoporosis.  Diabetes II Jenny Thomas has been given extensive diabetes education by myself today including ideal fasting and post-prandial blood glucose readings, individual ideal Hgb A1c goals and hypoglycemia prevention. We discussed the importance of good blood sugar control to decrease the likelihood of diabetic complications such as nephropathy, neuropathy, limb loss, blindness, coronary artery disease, and death. We discussed the importance of intensive lifestyle modification including diet, exercise and weight loss as the first line treatment for diabetes. Jenny Thomas agrees to continue Victoza, and continue diet, exercise, and weight loss. Jenny Thomas agrees to follow up with our clinic in 4 weeks.  Obesity Jenny Thomas is currently in the action stage of change. As such, her goal is to continue with weight loss efforts She has agreed to keep a food journal with 1000-1200 calories and 75+ grams of protein daily Jenny Thomas has been instructed to work up to a goal of 150 minutes of combined cardio and strengthening exercise per week for weight loss and overall health benefits. We discussed the following Behavioral Modification Strategies today: work on meal planning and easy cooking  plans, no skipping meals, and keeping healthy foods in the home   Jenny Thomas has agreed to follow up with  our clinic in 4 weeks. She was informed of the importance of frequent follow up visits to maximize her success with intensive lifestyle modifications for her multiple health conditions.   OBESITY BEHAVIORAL INTERVENTION VISIT  Today's visit was # 27  Starting weight: 217 lbs Starting date: 05/31/17 Today's weight : 214 lbs  Today's date: 09/12/2018 Total lbs lost to date: 3    ASK: We discussed the diagnosis of obesity with Laneta F Greth today and Presleigh agreed to give [name] permission to discuss obesity behavioral modification therapy today.  ASSESS: Ardath has the diagnosis of obesity and her BMI today is 43.2 Yeni is in the action stage of change   ADVISE: Adonica was educated on the multiple health risks of obesity as well as the benefit of weight loss to improve her health. She was advised of the need for long term treatment and the importance of lifestyle modifications.  AGREE: Multiple dietary modification options and treatment options were discussed and  Niccole agreed to the above obesity treatment plan.  Wilhemena Durie, am acting as transcriptionist for Abby Potash, PA-C I, Abby Potash, PA-C have reviewed above note and agree with its content

## 2018-09-30 DIAGNOSIS — R0602 Shortness of breath: Secondary | ICD-10-CM | POA: Diagnosis not present

## 2018-10-01 DIAGNOSIS — H2511 Age-related nuclear cataract, right eye: Secondary | ICD-10-CM | POA: Diagnosis not present

## 2018-10-08 ENCOUNTER — Encounter (INDEPENDENT_AMBULATORY_CARE_PROVIDER_SITE_OTHER): Payer: Self-pay | Admitting: Physician Assistant

## 2018-10-08 ENCOUNTER — Ambulatory Visit (INDEPENDENT_AMBULATORY_CARE_PROVIDER_SITE_OTHER): Payer: Medicare HMO | Admitting: Physician Assistant

## 2018-10-08 VITALS — BP 113/70 | HR 64 | Temp 98.1°F | Ht 59.0 in | Wt 216.0 lb

## 2018-10-08 DIAGNOSIS — Z6841 Body Mass Index (BMI) 40.0 and over, adult: Secondary | ICD-10-CM

## 2018-10-08 DIAGNOSIS — E559 Vitamin D deficiency, unspecified: Secondary | ICD-10-CM

## 2018-10-09 NOTE — Progress Notes (Signed)
Office: [phone]  /  Fax: [phone]   HPI:   Chief Complaint: OBESITY Jenny Thomas is here to discuss her progress with her obesity treatment plan. She is keeping a food journal with 1000 to 1200 calories and 80 grams of protein and is following her eating plan approximately 50 % of the time. She states she is dancing 60 minutes 1 time per week. Jenny Thomas struggled to follow the plan due to financial difficulties and the inability to buy an adequate amount of food.  Her weight is 216 lb (98 kg) today and has had a weight gain of 2 pounds over a period of 2 weeks since her last visit. She has lost 1 lb since starting treatment with [name].  Vitamin D deficiency Jenny Thomas has a diagnosis of vitamin D deficiency. She is currently taking vit D and denies nausea, vomiting or muscle weakness.  ALLERGIES: Allergies  Allergen Reactions  . Percocet [Oxycodone-Acetaminophen]     MEDICATIONS: Current Outpatient Medications on File Prior to Visit  Medication Sig Dispense Refill  . ACCU-CHEK FASTCLIX LANCETS MISC 1 each by Does not apply route 2 (two) times daily. 100 each 0  . albuterol (ACCUNEB) 0.63 MG/3ML nebulizer solution Take 1 ampule by nebulization every 6 (six) hours as needed for wheezing.    [name] albuterol (PROVENTIL HFA;VENTOLIN HFA) 108 (90 Base) MCG/ACT inhaler Inhale into the lungs every 6 (six) hours as needed for wheezing or shortness of breath.    [name] alendronate (FOSAMAX) 70 MG tablet Take 70 mg by mouth once a week. Take with a full glass of water on an empty stomach.    . Blood Glucose Monitoring Suppl (ACCU-CHEK NANO SMARTVIEW) w/Device KIT 1 each by Does not apply route daily. Check blood sugar twice a day. 1 kit 0  . Calcium Carb-Cholecalciferol (CALCIUM/VITAMIN D) 500-200 MG-UNIT TABS Take by mouth daily. Take one tablet by mouth once a day    . citalopram (CELEXA) 40 MG tablet Take 40 mg by mouth daily.    . Fluticasone-Salmeterol (ADVAIR) 250-50 MCG/DOSE AEPB Inhale 1 puff into  the lungs 2 (two) times daily.    . furosemide (LASIX) 20 MG tablet Take 20 mg by mouth daily as needed.    [name] glucose blood (ACCU-CHEK SMARTVIEW) test strip USE 1 STRIP TO CHECK GLUCOSE TWICE DAILY 100 each 0  . Insulin Pen Needle (BD PEN NEEDLE NANO U/F) 32G X 4 MM MISC 1 each by Does not apply route daily. 100 each 0  . levothyroxine (SYNTHROID, LEVOTHROID) 88 MCG tablet Take 88 mcg by mouth daily before breakfast.    . liraglutide (VICTOZA) 18 MG/3ML SOPN Inject 0.1 mLs (0.6 mg total) into the skin every morning. 3 pen 0  . losartan (COZAAR) 50 MG tablet Take 1 tablet (50 mg total) by mouth daily. 30 tablet 0  . montelukast (SINGULAIR) 10 MG tablet Take 10 mg by mouth at bedtime.    . polyethylene glycol powder (GLYCOLAX/MIRALAX) powder Take 17 g by mouth daily. 3350 g 0  . potassium chloride SA (K-DUR,KLOR-CON) 20 MEQ tablet Take 20 mEq by mouth once.    . simvastatin (ZOCOR) 20 MG tablet Take 20 mg by mouth daily.    . Vitamin D, Ergocalciferol, (DRISDOL) 50000 units CAPS capsule Take 1 capsule (50,000 Units total) by mouth every 7 (seven) days. 4 capsule 0   No current facility-administered medications on file prior to visit.     PAST MEDICAL HISTORY: Past Medical History:  Diagnosis Date  . Anxiety   .  Asthma   . Back pain   . Constipation   . COPD (chronic obstructive pulmonary disease) ([place])   . Depression   . Dyspnea   . Gallbladder problem   . Hyperlipidemia   . Hypertension   . Hypothyroid   . Joint pain   . Leg edema   . Prediabetes     PAST SURGICAL HISTORY: Past Surgical History:  Procedure Laterality Date  . ABDOMINAL HYSTERECTOMY    . CHOLECYSTECTOMY    . hernia repair    . LAPAROSCOPIC GASTRIC BANDING      SOCIAL HISTORY: Social History   Tobacco Use  . Smoking status: Never Smoker  . Smokeless tobacco: Never Used  Substance Use Topics  . Alcohol use: Not on file  . Drug use: Not on file    FAMILY HISTORY: Family History  Problem Relation Age  of Onset  . Breast cancer Maternal Aunt   . Hypertension Mother   . Kidney disease Mother   . Obesity Mother   . Stroke Father   . Heart disease Father   . Alcoholism Father     ROS: Review of Systems  Constitutional: Negative for weight loss.  Gastrointestinal: Negative for nausea and vomiting.  Musculoskeletal:       Negative for muscle weakness.    PHYSICAL EXAM: Blood pressure 113/70, pulse 64, temperature 98.1 F (36.7 C), temperature source Oral, height _0  (1.499 m), weight 216 lb (98 kg), SpO2 97 %. Body mass index is 43.63 kg/m. Physical Exam  Constitutional: She is oriented to person, place, and time. She appears well-developed and well-nourished.  Cardiovascular: Normal rate.  Pulmonary/Chest: Effort normal.  Musculoskeletal: Normal range of motion.  Neurological: She is oriented to person, place, and time.  Skin: Skin is warm and dry.  Psychiatric: She has a normal mood and affect. Her behavior is normal.  Vitals reviewed.   RECENT LABS AND TESTS: BMET    Component Value Date/Time   NA 140 02/01/2018   K 4.8 02/01/2018   CL 105 09/25/2017 1135   CO2 23 09/25/2017 1135   GLUCOSE 88 09/25/2017 1135   BUN 20 02/01/2018   CREATININE 1.0 02/01/2018   CREATININE 1.13 (H) 09/25/2017 1135   CALCIUM 9.0 09/25/2017 1135   GFRNONAA 50 (L) 09/25/2017 1135   GFRAA 58 (L) 09/25/2017 1135   Lab Results  Component Value Date   HGBA1C 5.7 02/01/2018   HGBA1C 5.6 09/25/2017   HGBA1C 5.7 (H) 05/31/2017   Lab Results  Component Value Date   INSULIN 14.6 09/25/2017   INSULIN 19.4 05/31/2017   CBC    Component Value Date/Time   WBC 5.4 05/31/2017 1158   RBC 4.58 05/31/2017 1158   HGB 13.9 05/31/2017 1158   HCT 41.0 05/31/2017 1158   MCV 90 05/31/2017 1158   MCH 30.3 05/31/2017 1158   MCHC 33.9 05/31/2017 1158   RDW 15.0 05/31/2017 1158   LYMPHSABS 1.8 05/31/2017 1158   EOSABS 0.1 05/31/2017 1158   BASOSABS 0.0 05/31/2017 1158   Iron/TIBC/Ferritin/  %Sat No results found for: IRON, TIBC, FERRITIN, IRONPCTSAT Lipid Panel     Component Value Date/Time   CHOL 168 02/01/2018   CHOL 165 09/25/2017 1135   TRIG 106 02/01/2018   HDL 71 (A) 02/01/2018   HDL 59 09/25/2017 1135   LDLCALC 76 02/01/2018   LDLCALC 82 09/25/2017 1135   Hepatic Function Panel     Component Value Date/Time   PROT 6.7 09/25/2017 1135   ALBUMIN  4.5 09/25/2017 1135   AST 23 02/01/2018   ALT 11 02/01/2018   ALKPHOS 98 09/25/2017 1135   BILITOT 0.6 09/25/2017 1135      Component Value Date/Time   TSH 1.09 02/01/2018   TSH 0.672 09/25/2017 1135   TSH 1.110 05/31/2017 1158   Results for KAJOL, CRISPEN (MRN [id number]) as of 10/09/2018 14:25  Ref. Range 09/25/2017 11:35  Vitamin D, 25-Hydroxy Latest Ref Range: 30.0 - 100.0 ng/mL 36.6   ASSESSMENT AND PLAN: Vitamin D deficiency  Class 3 severe obesity with serious comorbidity and body mass index (BMI) of 40.0 to 44.9 in adult, unspecified obesity type ([place])  PLAN:.  Vitamin D Deficiency Jenny Thomas was informed that low vitamin D levels contributes to fatigue and are associated with obesity, breast, and colon cancer. She agrees to continue to take prescription Vit D _0 ,000 IU every week and will follow up for routine testing of vitamin D, at least 2-3 times per year. She was informed of the risk of over-replacement of vitamin D and agrees to not increase her dose unless she discusses this with [name] first. Her PCP will check her vitamin D level next month. Jenny Thomas agreed to continue taking vitamin D and will follow up in 3 to 4 weeks.  Obesity Jenny Thomas is currently in the action stage of change. As such, her goal is to continue with weight loss efforts. She has agreed to keep a food journal with 100 to 2000 calories and 75 grams of protein daily. Jenny Thomas has been instructed to work up to a goal of 150 minutes of combined cardio and strengthening exercise per week for weight loss and overall health benefits. We  discussed the following Behavioral Modification Strategies today: no skipping meals, work on meal planning and easy cooking plans, and keeping healthy foods in the home.  Jenny Thomas has agreed to follow up with our clinic in 3 to 4 weeks. She was informed of the importance of frequent follow up visits to maximize her success with intensive lifestyle modifications for her multiple health conditions.   OBESITY BEHAVIORAL INTERVENTION VISIT  Today's visit was # 28  Starting weight: 217 lbs Starting date: 05/31/17 Today's weight : Weight: 216 lb (98 kg)  Today's date: 10/08/2018 Total lbs lost to date: 1 At least 15 minutes were spent on discussing the following behavioral intervention visit.  ASK: We discussed the diagnosis of obesity with Jenny Thomas today and Jenny Thomas agreed to give [name] permission to discuss obesity behavioral modification therapy today.  ASSESS: Jenny Thomas has the diagnosis of obesity and her BMI today is 43.6. Jenny Thomas is in the action stage of change.   Jenny Thomas: Jenny Thomas was educated on the multiple health risks of obesity as well as the benefit of weight loss to improve her health. She was advised of the need for long term treatment and the importance of lifestyle modifications to improve her current health and to decrease her risk of future health problems.  AGREE: Multiple dietary modification options and treatment options were discussed and Jenny Thomas agreed to follow the recommendations documented in the above note.  ARRANGE: Jenny Thomas was educated on the importance of frequent visits to treat obesity as outlined per CMS and USPSTF guidelines and agreed to schedule her next follow up appointment today.  Lenward Chancellor, am acting as transcriptionist for Abby Potash, PA-C I, Abby Potash, PA-C have reviewed above note and agree with its content

## 2018-10-26 DIAGNOSIS — I10 Essential (primary) hypertension: Secondary | ICD-10-CM | POA: Diagnosis not present

## 2018-10-26 DIAGNOSIS — N183 Chronic kidney disease, stage 3 (moderate): Secondary | ICD-10-CM | POA: Diagnosis not present

## 2018-10-26 DIAGNOSIS — E559 Vitamin D deficiency, unspecified: Secondary | ICD-10-CM | POA: Diagnosis not present

## 2018-10-26 DIAGNOSIS — E119 Type 2 diabetes mellitus without complications: Secondary | ICD-10-CM | POA: Diagnosis not present

## 2018-10-26 DIAGNOSIS — E039 Hypothyroidism, unspecified: Secondary | ICD-10-CM | POA: Diagnosis not present

## 2018-10-26 DIAGNOSIS — E7800 Pure hypercholesterolemia, unspecified: Secondary | ICD-10-CM | POA: Diagnosis not present

## 2018-10-31 ENCOUNTER — Other Ambulatory Visit (INDEPENDENT_AMBULATORY_CARE_PROVIDER_SITE_OTHER): Payer: Self-pay | Admitting: Family Medicine

## 2018-10-31 DIAGNOSIS — N183 Chronic kidney disease, stage 3 (moderate): Secondary | ICD-10-CM | POA: Diagnosis not present

## 2018-10-31 DIAGNOSIS — E7800 Pure hypercholesterolemia, unspecified: Secondary | ICD-10-CM | POA: Diagnosis not present

## 2018-10-31 DIAGNOSIS — E039 Hypothyroidism, unspecified: Secondary | ICD-10-CM | POA: Diagnosis not present

## 2018-10-31 DIAGNOSIS — F3340 Major depressive disorder, recurrent, in remission, unspecified: Secondary | ICD-10-CM | POA: Diagnosis not present

## 2018-10-31 DIAGNOSIS — I10 Essential (primary) hypertension: Secondary | ICD-10-CM | POA: Diagnosis not present

## 2018-10-31 DIAGNOSIS — E559 Vitamin D deficiency, unspecified: Secondary | ICD-10-CM

## 2018-10-31 DIAGNOSIS — F419 Anxiety disorder, unspecified: Secondary | ICD-10-CM | POA: Diagnosis not present

## 2018-10-31 DIAGNOSIS — R0602 Shortness of breath: Secondary | ICD-10-CM | POA: Diagnosis not present

## 2018-11-05 ENCOUNTER — Ambulatory Visit (INDEPENDENT_AMBULATORY_CARE_PROVIDER_SITE_OTHER): Payer: Medicare HMO | Admitting: Physician Assistant

## 2018-11-05 ENCOUNTER — Encounter (INDEPENDENT_AMBULATORY_CARE_PROVIDER_SITE_OTHER): Payer: Self-pay | Admitting: Physician Assistant

## 2018-11-05 VITALS — BP 110/72 | HR 110 | Temp 97.9°F | Ht 59.0 in | Wt 215.0 lb

## 2018-11-05 DIAGNOSIS — E559 Vitamin D deficiency, unspecified: Secondary | ICD-10-CM

## 2018-11-05 DIAGNOSIS — E7849 Other hyperlipidemia: Secondary | ICD-10-CM

## 2018-11-05 DIAGNOSIS — Z6841 Body Mass Index (BMI) 40.0 and over, adult: Secondary | ICD-10-CM | POA: Diagnosis not present

## 2018-11-05 MED ORDER — VITAMIN D (ERGOCALCIFEROL) 1.25 MG (50000 UNIT) PO CAPS: 50000.0000 [IU] | ORAL_CAPSULE | ORAL | 0 refills | Status: DC

## 2018-11-07 NOTE — Progress Notes (Signed)
Office: [phone]  /  Fax: [phone]   HPI:   Chief Complaint: OBESITY Jenny Thomas is here to discuss her progress with her obesity treatment plan. She is on the keep a food journal with 1000 to 1200 calories and 75 grams of protein daily plan and is following her eating plan approximately 25 % of the time. She states she is dancing and walking 30 minutes 3 times per week. Jenny Thomas did well with weight loss. She reports that she has not been journaling as she is very stressed at home. She is also having tooth pain and has been eating soft foods. Her weight is 215 lb (97.5 kg) today and has had a weight loss of 1 pound over a period of 4 weeks since her last visit. She has lost 2 lbs since starting treatment with [name].  Vitamin D deficiency Jenny Thomas has a diagnosis of vitamin D deficiency. Her last level was at 38.4 on 10/26/18. She is currently taking vit D and denies nausea, vomiting or muscle weakness.  Hyperlipidemia Jenny Thomas has hyperlipidemia and she is currently taking Simvastatin. Her last labs were on 10/26/18 with her PCP and her levels were normal. She has been trying to improve her cholesterol levels with intensive lifestyle modification including a low saturated fat diet, exercise and weight loss. She denies any chest pain.  ALLERGIES: Allergies  Allergen Reactions  . Percocet [Oxycodone-Acetaminophen]     MEDICATIONS: Current Outpatient Medications on File Prior to Visit  Medication Sig Dispense Refill  . ACCU-CHEK FASTCLIX LANCETS MISC 1 each by Does not apply route 2 (two) times daily. 100 each 0  . albuterol (ACCUNEB) 0.63 MG/3ML nebulizer solution Take 1 ampule by nebulization every 6 (six) hours as needed for wheezing.    [name] albuterol (PROVENTIL HFA;VENTOLIN HFA) 108 (90 Base) MCG/ACT inhaler Inhale into the lungs every 6 (six) hours as needed for wheezing or shortness of breath.    [name] alendronate (FOSAMAX) 70 MG tablet Take 70 mg by mouth once a week. Take with a full  glass of water on an empty stomach.    . Blood Glucose Monitoring Suppl (ACCU-CHEK NANO SMARTVIEW) w/Device KIT 1 each by Does not apply route daily. Check blood sugar twice a day. 1 kit 0  . busPIRone (BUSPAR) 5 MG tablet Take 5 mg by mouth 3 (three) times daily.    . Calcium Carb-Cholecalciferol (CALCIUM/VITAMIN D) 500-200 MG-UNIT TABS Take by mouth daily. Take one tablet by mouth once a day    . citalopram (CELEXA) 40 MG tablet Take 40 mg by mouth daily.    . Fluticasone-Salmeterol (ADVAIR) 250-50 MCG/DOSE AEPB Inhale 1 puff into the lungs 2 (two) times daily.    . furosemide (LASIX) 20 MG tablet Take 20 mg by mouth daily as needed.    [name] levothyroxine (SYNTHROID, LEVOTHROID) 88 MCG tablet Take 88 mcg by mouth daily before breakfast.    . losartan (COZAAR) 50 MG tablet Take 1 tablet (50 mg total) by mouth daily. 30 tablet 0  . montelukast (SINGULAIR) 10 MG tablet Take 10 mg by mouth at bedtime.    . polyethylene glycol powder (GLYCOLAX/MIRALAX) powder Take 17 g by mouth daily. 3350 g 0  . potassium chloride SA (K-DUR,KLOR-CON) 20 MEQ tablet Take 20 mEq by mouth once.    . simvastatin (ZOCOR) 20 MG tablet Take 20 mg by mouth daily.     No current facility-administered medications on file prior to visit.     PAST MEDICAL HISTORY: Past Medical History:  Diagnosis Date  . Anxiety   . Asthma   . Back pain   . Constipation   . COPD (chronic obstructive pulmonary disease) ([place])   . Depression   . Dyspnea   . Gallbladder problem   . Hyperlipidemia   . Hypertension   . Hypothyroid   . Joint pain   . Leg edema   . Prediabetes     PAST SURGICAL HISTORY: Past Surgical History:  Procedure Laterality Date  . ABDOMINAL HYSTERECTOMY    . CHOLECYSTECTOMY    . hernia repair    . LAPAROSCOPIC GASTRIC BANDING      SOCIAL HISTORY: Social History   Tobacco Use  . Smoking status: Never Smoker  . Smokeless tobacco: Never Used  Substance Use Topics  . Alcohol use: Not on file  . Drug  use: Not on file    FAMILY HISTORY: Family History  Problem Relation Age of Onset  . Breast cancer Maternal Aunt   . Hypertension Mother   . Kidney disease Mother   . Obesity Mother   . Stroke Father   . Heart disease Father   . Alcoholism Father     ROS: Review of Systems  Constitutional: Positive for weight loss.  Cardiovascular: Negative for chest pain.  Gastrointestinal: Negative for nausea and vomiting.  Musculoskeletal:       Negative for muscle weakness    PHYSICAL EXAM: Blood pressure 110/72, pulse (!) 110, temperature 97.9 F (36.6 C), temperature source Oral, height 4' 11"  (1.499 m), weight 215 lb (97.5 kg), SpO2 96 %. Body mass index is 43.42 kg/m. Physical Exam  Constitutional: She is oriented to person, place, and time. She appears well-developed and well-nourished.  Cardiovascular: Normal rate.  Pulmonary/Chest: Effort normal.  Musculoskeletal: Normal range of motion.  Neurological: She is oriented to person, place, and time.  Skin: Skin is warm and dry.  Psychiatric: She has a normal mood and affect. Her behavior is normal.  Vitals reviewed.   RECENT LABS AND TESTS: BMET    Component Value Date/Time   NA 140 02/01/2018   K 4.8 02/01/2018   CL 105 09/25/2017 1135   CO2 23 09/25/2017 1135   GLUCOSE 88 09/25/2017 1135   BUN 20 02/01/2018   CREATININE 1.0 02/01/2018   CREATININE 1.13 (H) 09/25/2017 1135   CALCIUM 9.0 09/25/2017 1135   GFRNONAA 50 (L) 09/25/2017 1135   GFRAA 58 (L) 09/25/2017 1135   Lab Results  Component Value Date   HGBA1C 5.7 02/01/2018   HGBA1C 5.6 09/25/2017   HGBA1C 5.7 (H) 05/31/2017   Lab Results  Component Value Date   INSULIN 14.6 09/25/2017   INSULIN 19.4 05/31/2017   CBC    Component Value Date/Time   WBC 5.4 05/31/2017 1158   RBC 4.58 05/31/2017 1158   HGB 13.9 05/31/2017 1158   HCT 41.0 05/31/2017 1158   MCV 90 05/31/2017 1158   MCH 30.3 05/31/2017 1158   MCHC 33.9 05/31/2017 1158   RDW 15.0  05/31/2017 1158   LYMPHSABS 1.8 05/31/2017 1158   EOSABS 0.1 05/31/2017 1158   BASOSABS 0.0 05/31/2017 1158   Iron/TIBC/Ferritin/ %Sat No results found for: IRON, TIBC, FERRITIN, IRONPCTSAT Lipid Panel     Component Value Date/Time   CHOL 168 02/01/2018   CHOL 165 09/25/2017 1135   TRIG 106 02/01/2018   HDL 71 (A) 02/01/2018   HDL 59 09/25/2017 1135   LDLCALC 76 02/01/2018   LDLCALC 82 09/25/2017 1135   Hepatic Function Panel  Component Value Date/Time   PROT 6.7 09/25/2017 1135   ALBUMIN 4.5 09/25/2017 1135   AST 23 02/01/2018   ALT 11 02/01/2018   ALKPHOS 98 09/25/2017 1135   BILITOT 0.6 09/25/2017 1135      Component Value Date/Time   TSH 1.09 02/01/2018   TSH 0.672 09/25/2017 1135   TSH 1.110 05/31/2017 1158   Results for HASET, OAXACA (MRN [id number]) as of 11/07/2018 16:35  Ref. Range 09/25/2017 11:35  Vitamin D, 25-Hydroxy Latest Ref Range: 30.0 - 100.0 ng/mL 36.6   ASSESSMENT AND PLAN: Vitamin D deficiency - Plan: Vitamin D, Ergocalciferol, (DRISDOL) 1.25 MG (50000 UT) CAPS capsule  Other hyperlipidemia  Class 3 severe obesity with serious comorbidity and body mass index (BMI) of 40.0 to 44.9 in adult, unspecified obesity type (HCC)  PLAN:  Vitamin D Deficiency Jenny Thomas was informed that low vitamin D levels contributes to fatigue and are associated with obesity, breast, and colon cancer. She agrees to continue to take prescription Vit D @50 ,000 IU every week #4 with no refills and will follow up for routine testing of vitamin D, at least 2-3 times per year. She was informed of the risk of over-replacement of vitamin D and agrees to not increase her dose unless she discusses this with [name] first. Jenny Thomas agrees to follow up as directed.  Hyperlipidemia Jenny Thomas was informed of the American Heart Association Guidelines emphasizing intensive lifestyle modifications as the first line treatment for hyperlipidemia. We discussed many lifestyle modifications  today in depth, and Jenny Thomas will continue to work on decreasing saturated fats such as fatty red meat, butter and many fried foods. She will also increase vegetables and lean protein in her diet and continue to work on exercise and weight loss efforts. Jenny Thomas will continue Simvastatin and follow up as directed.  Obesity Jenny Thomas is currently in the action stage of change. As such, her goal is to continue with weight loss efforts She has agreed to portion control better and make smarter food choices, such as increase vegetables and decrease simple carbohydrates  Jenny Thomas has been instructed to work up to a goal of 150 minutes of combined cardio and strengthening exercise per week for weight loss and overall health benefits. We discussed the following Behavioral Modification Strategies today: work on meal planning and easy cooking plans and holiday eating strategies   Jenny Thomas has agreed to follow up with our clinic in 3 to 4 weeks. She was informed of the importance of frequent follow up visits to maximize her success with intensive lifestyle modifications for her multiple health conditions.   OBESITY BEHAVIORAL INTERVENTION VISIT  Today's visit was # 29  Starting weight: 217 lbs Starting date: 05/31/2017 Today's weight : 215 lbs Today's date: 11/05/2018 Total lbs lost to date: 2 At least 15 minutes were spent on discussing the following behavioral intervention visit.   ASK: We discussed the diagnosis of obesity with Jenny Thomas today and Jenny Thomas agreed to give [name] permission to discuss obesity behavioral modification therapy today.  ASSESS: Jenny Thomas has the diagnosis of obesity and her BMI today is 43.4 Jenny Thomas is in the action stage of change   ADVISE: Jenny Thomas was educated on the multiple health risks of obesity as well as the benefit of weight loss to improve her health. She was advised of the need for long term treatment and the importance of lifestyle modifications to improve  her current health and to decrease her risk of future health problems.  AGREE: Multiple dietary modification options  and treatment options were discussed and  Jenny Thomas agreed to follow the recommendations documented in the above note.  ARRANGE: Jenny Thomas was educated on the importance of frequent visits to treat obesity as outlined per CMS and USPSTF guidelines and agreed to schedule her next follow up appointment today.  Corey Skains, am acting as transcriptionist for Abby Potash, PA-C I, Abby Potash, PA-C have reviewed above note and agree with its content

## 2018-11-15 DIAGNOSIS — R05 Cough: Secondary | ICD-10-CM | POA: Diagnosis not present

## 2018-11-15 DIAGNOSIS — J441 Chronic obstructive pulmonary disease with (acute) exacerbation: Secondary | ICD-10-CM | POA: Diagnosis not present

## 2018-11-19 DIAGNOSIS — R05 Cough: Secondary | ICD-10-CM | POA: Diagnosis not present

## 2018-11-19 DIAGNOSIS — J40 Bronchitis, not specified as acute or chronic: Secondary | ICD-10-CM | POA: Diagnosis not present

## 2018-11-23 ENCOUNTER — Encounter: Payer: Self-pay | Admitting: Emergency Medicine

## 2018-11-23 ENCOUNTER — Other Ambulatory Visit: Payer: Self-pay

## 2018-11-23 ENCOUNTER — Emergency Department: Payer: Medicare HMO

## 2018-11-23 ENCOUNTER — Inpatient Hospital Stay
Admission: EM | Admit: 2018-11-23 | Discharge: 2018-11-26 | DRG: 175 | Disposition: A | Discharge status: Home / Self Care | Payer: Medicare HMO | Attending: Internal Medicine | Admitting: Internal Medicine

## 2018-11-23 DIAGNOSIS — Z79890 Hormone replacement therapy (postmenopausal): Secondary | ICD-10-CM | POA: Diagnosis not present

## 2018-11-23 DIAGNOSIS — Z885 Allergy status to narcotic agent status: Secondary | ICD-10-CM

## 2018-11-23 DIAGNOSIS — Z7951 Long term (current) use of inhaled steroids: Secondary | ICD-10-CM

## 2018-11-23 DIAGNOSIS — R05 Cough: Secondary | ICD-10-CM | POA: Diagnosis not present

## 2018-11-23 DIAGNOSIS — Z811 Family history of alcohol abuse and dependence: Secondary | ICD-10-CM

## 2018-11-23 DIAGNOSIS — Z823 Family history of stroke: Secondary | ICD-10-CM

## 2018-11-23 DIAGNOSIS — I10 Essential (primary) hypertension: Secondary | ICD-10-CM | POA: Diagnosis not present

## 2018-11-23 DIAGNOSIS — Z803 Family history of malignant neoplasm of breast: Secondary | ICD-10-CM

## 2018-11-23 DIAGNOSIS — Z841 Family history of disorders of kidney and ureter: Secondary | ICD-10-CM

## 2018-11-23 DIAGNOSIS — E119 Type 2 diabetes mellitus without complications: Secondary | ICD-10-CM

## 2018-11-23 DIAGNOSIS — J9601 Acute respiratory failure with hypoxia: Secondary | ICD-10-CM | POA: Diagnosis present

## 2018-11-23 DIAGNOSIS — E785 Hyperlipidemia, unspecified: Secondary | ICD-10-CM | POA: Diagnosis present

## 2018-11-23 DIAGNOSIS — I2699 Other pulmonary embolism without acute cor pulmonale: Principal | ICD-10-CM | POA: Diagnosis present

## 2018-11-23 DIAGNOSIS — Z0389 Encounter for observation for other suspected diseases and conditions ruled out: Secondary | ICD-10-CM | POA: Diagnosis not present

## 2018-11-23 DIAGNOSIS — J449 Chronic obstructive pulmonary disease, unspecified: Secondary | ICD-10-CM | POA: Diagnosis not present

## 2018-11-23 DIAGNOSIS — E039 Hypothyroidism, unspecified: Secondary | ICD-10-CM | POA: Diagnosis present

## 2018-11-23 DIAGNOSIS — I259 Chronic ischemic heart disease, unspecified: Secondary | ICD-10-CM | POA: Diagnosis not present

## 2018-11-23 DIAGNOSIS — Z7983 Long term (current) use of bisphosphonates: Secondary | ICD-10-CM | POA: Diagnosis not present

## 2018-11-23 DIAGNOSIS — Z6841 Body Mass Index (BMI) 40.0 and over, adult: Secondary | ICD-10-CM

## 2018-11-23 DIAGNOSIS — Z8249 Family history of ischemic heart disease and other diseases of the circulatory system: Secondary | ICD-10-CM | POA: Diagnosis not present

## 2018-11-23 DIAGNOSIS — Z7901 Long term (current) use of anticoagulants: Secondary | ICD-10-CM | POA: Diagnosis not present

## 2018-11-23 DIAGNOSIS — J441 Chronic obstructive pulmonary disease with (acute) exacerbation: Secondary | ICD-10-CM | POA: Diagnosis present

## 2018-11-23 DIAGNOSIS — R0602 Shortness of breath: Secondary | ICD-10-CM | POA: Diagnosis not present

## 2018-11-23 DIAGNOSIS — Z90710 Acquired absence of both cervix and uterus: Secondary | ICD-10-CM

## 2018-11-23 DIAGNOSIS — I361 Nonrheumatic tricuspid (valve) insufficiency: Secondary | ICD-10-CM | POA: Diagnosis not present

## 2018-11-23 LAB — TROPONIN I
TROPONIN I: 0.39 ng/mL — AB (ref <0.03)
Troponin I: 0.22 ng/mL (ref <0.03)

## 2018-11-23 LAB — BASIC METABOLIC PANEL
Anion gap: 9 (ref 5–15)
BUN: 24 mg/dL — ABNORMAL HIGH (ref 8–23)
CO2: 26 mmol/L (ref 22–32)
Calcium: 8.6 mg/dL — ABNORMAL LOW (ref 8.9–10.3)
Chloride: 103 mmol/L (ref 98–111)
Creatinine, Ser: 1.26 mg/dL — ABNORMAL HIGH (ref 0.44–1.00)
GFR calc Af Amer: 50 mL/min — ABNORMAL LOW (ref >60)
GFR calc non Af Amer: 43 mL/min — ABNORMAL LOW (ref >60)
Glucose, Bld: 104 mg/dL — ABNORMAL HIGH (ref 70–99)
Potassium: 4 mmol/L (ref 3.5–5.1)
Sodium: 138 mmol/L (ref 135–145)

## 2018-11-23 LAB — CBC
HCT: 40.5 % (ref 36.0–46.0)
Hemoglobin: 13.4 g/dL (ref 12.0–15.0)
MCH: 30.5 pg (ref 26.0–34.0)
MCHC: 33.1 g/dL (ref 30.0–36.0)
MCV: 92.3 fL (ref 80.0–100.0)
PLATELETS: 221 10*3/uL (ref 150–400)
RBC: 4.39 MIL/uL (ref 3.87–5.11)
RDW: 13.5 % (ref 11.5–15.5)
WBC: 9.5 10*3/uL (ref 4.0–10.5)
nRBC: 0 % (ref 0.0–0.2)

## 2018-11-23 LAB — BRAIN NATRIURETIC PEPTIDE: B Natriuretic Peptide: 234 pg/mL — ABNORMAL HIGH (ref 0.0–100.0)

## 2018-11-23 MED ORDER — IPRATROPIUM-ALBUTEROL 0.5-2.5 (3) MG/3ML IN SOLN
3.0000 mL | Freq: Once | RESPIRATORY_TRACT | Status: AC
  Administered 2018-11-23: 3 mL via RESPIRATORY_TRACT
  Filled 2018-11-23: qty 3

## 2018-11-23 MED ORDER — METHYLPREDNISOLONE SODIUM SUCC 125 MG IJ SOLR
125.0000 mg | INTRAMUSCULAR | Status: AC
  Administered 2018-11-23: 125 mg via INTRAVENOUS
  Filled 2018-11-23: qty 2

## 2018-11-23 MED ORDER — GUAIFENESIN-DM 100-10 MG/5ML PO SYRP
5.0000 mL | ORAL_SOLUTION | ORAL | Status: DC | PRN
  Administered 2018-11-24 – 2018-11-26 (×13): 5 mL via ORAL
  Filled 2018-11-23 (×13): qty 5

## 2018-11-23 MED ORDER — HEPARIN BOLUS VIA INFUSION
4000.0000 [IU] | Freq: Once | INTRAVENOUS | Status: AC
  Administered 2018-11-24: 4000 [IU] via INTRAVENOUS
  Filled 2018-11-23: qty 4000

## 2018-11-23 MED ORDER — IOHEXOL 350 MG/ML SOLN
60.0000 mL | Freq: Once | INTRAVENOUS | Status: AC | PRN
  Administered 2018-11-23: 60 mL via INTRAVENOUS

## 2018-11-23 MED ORDER — IPRATROPIUM-ALBUTEROL 0.5-2.5 (3) MG/3ML IN SOLN
3.0000 mL | Freq: Once | RESPIRATORY_TRACT | Status: AC
  Administered 2018-11-23: 3 mL via RESPIRATORY_TRACT
  Filled 2018-11-23: qty 9

## 2018-11-23 MED ORDER — BENZONATATE 100 MG PO CAPS
200.0000 mg | ORAL_CAPSULE | Freq: Three times a day (TID) | ORAL | Status: DC | PRN
  Administered 2018-11-23 – 2018-11-26 (×8): 200 mg via ORAL
  Filled 2018-11-23 (×8): qty 2

## 2018-11-23 MED ORDER — HEPARIN (PORCINE) 25000 UT/250ML-% IV SOLN
1150.0000 [IU]/h | INTRAVENOUS | Status: DC
  Administered 2018-11-24: 1150 [IU]/h via INTRAVENOUS
  Filled 2018-11-23 (×2): qty 250

## 2018-11-23 MED ORDER — ALBUTEROL SULFATE (2.5 MG/3ML) 0.083% IN NEBU
2.5000 mg | INHALATION_SOLUTION | Freq: Once | RESPIRATORY_TRACT | Status: AC
  Administered 2018-11-23: 2.5 mg via RESPIRATORY_TRACT
  Filled 2018-11-23: qty 3

## 2018-11-23 MED ORDER — ASPIRIN 81 MG PO CHEW
324.0000 mg | CHEWABLE_TABLET | Freq: Once | ORAL | Status: AC
  Administered 2018-11-23: 324 mg via ORAL
  Filled 2018-11-23: qty 4

## 2018-11-23 NOTE — ED Triage Notes (Signed)
Has had abx X  2 for bronchitis as well as steroids. Returned for dyspnea. Labored, tachypnea. + cough.

## 2018-11-23 NOTE — ED Notes (Signed)
Lab called to draw blood

## 2018-11-23 NOTE — Progress Notes (Signed)
ANTICOAGULATION CONSULT NOTE - Initial Consult  Pharmacy Consult for heparin drip Indication: pulmonary embolus  Allergies  Allergen Reactions  . Percocet [Oxycodone-Acetaminophen] Nausea And Vomiting    Patient Measurements: Height: 4\' 11"  (149.9 cm) Weight: 214 lb 15.2 oz (97.5 kg) IBW/kg (Calculated) : 43.2 Heparin Dosing Weight: 67kg  Vital Signs: Temp: 98.7 F (37.1 C) (12/13 1844) Temp Source: Oral (12/13 1844) BP: 105/80 (12/13 2316) Pulse Rate: 121 (12/13 2316)  Labs: Recent Labs    11/23/18 1946 11/23/18 2246  HGB  --  13.4  HCT  --  40.5  PLT  --  221  CREATININE 1.26*  --   TROPONINI 0.39* 0.22*    Estimated Creatinine Clearance: 42.6 mL/min (A) (by C-G formula based on SCr of 1.26 mg/dL (H)).   Medical History: Past Medical History:  Diagnosis Date  . Anxiety   . Asthma   . Back pain   . Constipation   . COPD (chronic obstructive pulmonary disease) ([place])   . Depression   . Dyspnea   . Gallbladder problem   . Hyperlipidemia   . Hypertension   . Hypothyroid   . Joint pain   . Leg edema   . Prediabetes     Medications:  No anticoag in PTA meds.  Assessment:  Goal of Therapy:  Heparin level 0.3-0.7 units/ml Monitor platelets by anticoagulation protocol: Yes   Plan:  4000 unit bolus and initial rate of 1150 units/hr. First heparin level 6 hours after start of infusion.  Dashae Wilcher S 11/23/2018,11:45 PM

## 2018-11-23 NOTE — H&P (Signed)
[facility] [place] NAME: Jenny Thomas    MR#:  [id number]  DATE OF BIRTH:  1946-01-08  DATE OF ADMISSION:  11/23/2018  PRIMARY CARE PHYSICIAN: [name], MD   REQUESTING/REFERRING PHYSICIAN: Jacqualine Code, MD  CHIEF COMPLAINT:   Chief Complaint  Patient presents with  . Shortness of Breath    HISTORY OF PRESENT ILLNESS:  Jenny Thomas  is a 72 y.o. female who presents with chief complaint as above.  Patient presents with a complaint of about 7 to 10 days of progressive shortness of breath.  She states that over the past couple of days this is become significantly worse such that she has a hard time just walking around her house without getting winded.  She has had a lot of wheezing.  Patient does have a history of COPD, and was seen earlier this week and told she had bronchitis, which she states that she gets frequently.  However, treatment did not improve her symptoms.  Here in the ED tonight she had CTA chest performed after she was found to have a positive troponin, and her CTA chest showed bilateral pulmonary emboli.  Hospitalist were called for admission  PAST MEDICAL HISTORY:   Past Medical History:  Diagnosis Date  . Anxiety   . Asthma   . Back pain   . Constipation   . COPD (chronic obstructive pulmonary disease) ([place])   . Depression   . Dyspnea   . Gallbladder problem   . Hyperlipidemia   . Hypertension   . Hypothyroid   . Joint pain   . Leg edema   . Prediabetes      PAST SURGICAL HISTORY:   Past Surgical History:  Procedure Laterality Date  . ABDOMINAL HYSTERECTOMY    . CHOLECYSTECTOMY    . hernia repair    . LAPAROSCOPIC GASTRIC BANDING       SOCIAL HISTORY:   Social History   Tobacco Use  . Smoking status: Never Smoker  . Smokeless tobacco: Never Used  Substance Use Topics  . Alcohol use: Not on file     FAMILY HISTORY:   Family History  Problem Relation Age of Onset  .  Breast cancer Maternal Aunt   . Hypertension Mother   . Kidney disease Mother   . Obesity Mother   . Stroke Father   . Heart disease Father   . Alcoholism Father      DRUG ALLERGIES:   Allergies  Allergen Reactions  . Percocet [Oxycodone-Acetaminophen] Nausea And Vomiting    MEDICATIONS AT HOME:   Prior to Admission medications   Medication Sig Start Date End Date Taking? Authorizing Provider  albuterol (PROVENTIL HFA;VENTOLIN HFA) 108 (90 Base) MCG/ACT inhaler Inhale into the lungs every 6 (six) hours as needed for wheezing or shortness of breath.   Yes [provider]  albuterol (PROVENTIL) (2.5 MG/3ML) 0.083% nebulizer solution Take 2.5 mg by nebulization every 6 (six) hours as needed for wheezing or shortness of breath.   Yes [provider]  alendronate (FOSAMAX) 70 MG tablet Take 70 mg by mouth once a week. Take with a full glass of water on an empty stomach.   Yes [provider]  busPIRone (BUSPAR) 5 MG tablet Take 5 mg by mouth 2 (two) times daily.    Yes [provider]  cetirizine (ZYRTEC) 10 MG tablet Take 10 mg by mouth daily.   Yes [provider]  citalopram (CELEXA) 40 MG tablet  Take 40 mg by mouth daily.   Yes [provider]  Fluticasone-Salmeterol (ADVAIR) 250-50 MCG/DOSE AEPB Inhale 1 puff into the lungs 2 (two) times daily.   Yes [provider]  levothyroxine (SYNTHROID, LEVOTHROID) 88 MCG tablet Take 88 mcg by mouth daily before breakfast.   Yes [provider]  losartan (COZAAR) 50 MG tablet Take 1 tablet (50 mg total) by mouth daily. 08/22/17  Yes Alene Mires, Sahar M, PA-C  montelukast (SINGULAIR) 10 MG tablet Take 10 mg by mouth at bedtime.   Yes [provider]  Multiple Vitamin (MULTIVITAMIN WITH MINERALS) TABS tablet Take 1 tablet by mouth daily.   Yes [provider]  pseudoephedrine-guaifenesin (MUCINEX D) 60-600 MG 12 hr tablet Take 1 tablet by mouth every 12 (twelve)  hours as needed for congestion.   Yes [provider]  simvastatin (ZOCOR) 20 MG tablet Take 20 mg by mouth at bedtime.    Yes [provider]  Vitamin D, Ergocalciferol, (DRISDOL) 1.25 MG (50000 UT) CAPS capsule Take 1 capsule (50,000 Units total) by mouth every 7 (seven) days. 11/05/18  Yes Abby Potash, PA-C  ACCU-CHEK FASTCLIX LANCETS MISC 1 each by Does not apply route 2 (two) times daily. 06/26/18   Dennard Nip D, MD  Blood Glucose Monitoring Suppl (ACCU-CHEK NANO SMARTVIEW) w/Device KIT 1 each by Does not apply route daily. Check blood sugar twice a day. 07/26/17   Dennard Nip D, MD  polyethylene glycol powder (GLYCOLAX/MIRALAX) powder Take 17 g by mouth daily. Patient not taking: Reported on 11/23/2018 06/26/18   Dennard Nip D, MD    REVIEW OF SYSTEMS:  Review of Systems  Constitutional: Negative for chills, fever, malaise/fatigue and weight loss.  HENT: Negative for ear pain, hearing loss and tinnitus.   Eyes: Negative for blurred vision, double vision, pain and redness.  Respiratory: Positive for cough, shortness of breath and wheezing. Negative for hemoptysis.   Cardiovascular: Negative for chest pain, palpitations, orthopnea and leg swelling.  Gastrointestinal: Negative for abdominal pain, constipation, diarrhea, nausea and vomiting.  Genitourinary: Negative for dysuria, frequency and hematuria.  Musculoskeletal: Negative for back pain, joint pain and neck pain.  Skin:       No acne, rash, or lesions  Neurological: Negative for dizziness, tremors, focal weakness and weakness.  Endo/Heme/Allergies: Negative for polydipsia. Does not bruise/bleed easily.  Psychiatric/Behavioral: Negative for depression. The patient is not nervous/anxious and does not have insomnia.      VITAL SIGNS:   Vitals:   11/23/18 2215 11/23/18 2245 11/23/18 2300 11/23/18 2316  BP:    105/80  Pulse: 92 95 96 (!) 121  Resp: (!) 23 (!) _0 Temp:      TempSrc:      SpO2:  91% 92% 92% 91%  Weight:      Height:       Wt Readings from Last 3 Encounters:  11/23/18 97.5 kg  11/05/18 97.5 kg  10/08/18 98 kg    PHYSICAL EXAMINATION:  Physical Exam  Vitals reviewed. Constitutional: She is oriented to person, place, and time. She appears well-developed and well-nourished. No distress.  HENT:  Head: Normocephalic and atraumatic.  Mouth/Throat: Oropharynx is clear and moist.  Eyes: Pupils are equal, round, and reactive to light. Conjunctivae and EOM are normal. No scleral icterus.  Neck: Normal range of motion. Neck supple. No JVD present. No thyromegaly present.  Cardiovascular: Regular rhythm and intact distal pulses. Exam reveals no gallop and no friction rub.  No  murmur heard. tachycardia  Respiratory: Effort normal. No respiratory distress. She has wheezes (coarse, bilateral). She has no rales.  GI: Soft. Bowel sounds are normal. She exhibits no distension. There is no abdominal tenderness.  Musculoskeletal: Normal range of motion.        General: No edema.     Comments: No arthritis, no gout  Lymphadenopathy:    She has no cervical adenopathy.  Neurological: She is alert and oriented to person, place, and time. No cranial nerve deficit.  No dysarthria, no aphasia  Skin: Skin is warm and dry. No rash noted. No erythema.  Psychiatric: She has a normal mood and affect. Her behavior is normal. Judgment and thought content normal.    LABORATORY PANEL:   CBC Recent Labs  Lab 11/23/18 2246  WBC 9.5  HGB 13.4  HCT 40.5  PLT 221   ------------------------------------------------------------------------------------------------------------------  Chemistries  Recent Labs  Lab 11/23/18 1946  NA 138  K 4.0  CL 103  CO2 26  GLUCOSE 104*  BUN 24*  CREATININE 1.26*  CALCIUM 8.6*   ------------------------------------------------------------------------------------------------------------------  Cardiac Enzymes Recent Labs  Lab  11/23/18 1946  TROPONINI 0.39*   ------------------------------------------------------------------------------------------------------------------  RADIOLOGY:  Ct Angio Chest Pe W And/or Wo Contrast  Result Date: 11/23/2018 CLINICAL DATA:  Chest pain and shortness of breath. EXAM: CT ANGIOGRAPHY CHEST WITH CONTRAST TECHNIQUE: Multidetector CT imaging of the chest was performed using the standard protocol during bolus administration of intravenous contrast. Multiplanar CT image reconstructions and MIPs were obtained to evaluate the vascular anatomy. CONTRAST:  69m OMNIPAQUE IOHEXOL 350 MG/ML SOLN COMPARISON:  None. FINDINGS: Cardiovascular: --Pulmonary arteries: Contrast injection is sufficient to demonstrate satisfactory opacification of the pulmonary arteries to the segmental level.There are bilateral pulmonary emboli, predominantly on the right side. There are filling defects within the right upper lobe pulmonary artery, extending into the segmental branches; within the proximal right lower lobe segmental branches and within the right middle lobe segmental branches. On the left, there are filling defects within the segmental branches of the left lower lobe. There is straightening of the interventricular septum with a RV:LV ratio of approximately 1. The main pulmonary artery is within normal limits for size. --Aorta: Satisfactory opacification of the thoracic aorta. No aortic dissection or other acute aortic syndrome. Conventional 3 vessel aortic branching pattern. The aortic course and caliber are normal. There is no aortic atherosclerosis. --Heart: Normal size. No pericardial effusion. Mediastinum/Nodes: No mediastinal, hilar or axillary lymphadenopathy. The visualized thyroid and thoracic esophageal course are unremarkable. Lungs/Pleura: No pulmonary nodules or masses. No pleural effusion or pneumothorax. No focal airspace consolidation. No focal pleural abnormality. Upper Abdomen: Contrast bolus  timing is not optimized for evaluation of the abdominal organs. Within this limitation, the visualized organs of the upper abdomen are normal. Musculoskeletal: No chest wall abnormality. No acute or significant osseous findings. Review of the MIP images confirms the above findings. IMPRESSION: 1. Bilateral pulmonary emboli, right greater than left, involving segmental branches of all lobes of the right lung and the left lower lobe. 2. CT findings equivocal for right heart strain with straightening of the interventricular septum and RV:LV ratio of approximately 1.0. Critical Value/emergent results were called by telephone at the time of interpretation on 11/23/2018 at 10:50 pm to Dr. SClearnce Hasten who verbally acknowledged these results. Electronically Signed   By: KUlyses JarredM.D.   On: 11/23/2018 22:53   Dg Chest Portable 1 View  Result Date: 11/23/2018 CLINICAL DATA:  Initial evaluation for acute try cough,  shortness of breath. EXAM: PORTABLE CHEST 1 VIEW COMPARISON:  Prior radiograph from 03/07/2013. FINDINGS: The cardiac and mediastinal silhouettes are stable in size and contour, and remain within normal limits. The lungs are normally inflated. No airspace consolidation, pleural effusion, or pulmonary edema is identified. There is no pneumothorax. No acute osseous abnormality identified. IMPRESSION: No radiographic evidence for active cardiopulmonary disease. Electronically Signed   By: Jeannine Boga M.D.   On: 11/23/2018 19:17    EKG:   Orders placed or performed during the hospital encounter of 11/23/18  . EKG 12-Lead  . EKG 12-Lead  . EKG 12-Lead  . EKG 12-Lead  . ED EKG  . ED EKG    IMPRESSION AND PLAN:  Principal Problem:   Bilateral pulmonary embolism (HCC) -IV heparin started, no evidence on CT imaging of right heart strain, patient will be started on oral anticoagulation prior to discharge and can follow-up with her pulmonologist Active Problems:   Type 2 diabetes mellitus  without complication, without long-term current use of insulin (HCC) -sliding scale insulin coverage   Essential hypertension -home dose antihypertensives   COPD (chronic obstructive pulmonary disease) (HCC) -continue home meds   HLD (hyperlipidemia) -Home dose antilipid   Hypothyroidism -home dose thyroid replacement  Chart review performed and case discussed with ED provider. Labs, imaging and/or ECG reviewed by provider and discussed with patient/family. Management plans discussed with the patient and/or family.  DVT PROPHYLAXIS: Systemic anticoagulation  GI PROPHYLAXIS:  None  ADMISSION STATUS: Inpatient     CODE STATUS: Full  TOTAL TIME TAKING CARE OF THIS PATIENT: 45 minutes.   Jannifer Franklin, Igor Bishop [place] 11/23/2018, 11:20 PM  [facility]  Office  [phone]  CC: Primary care physician; [name], MD  Note:  This document was prepared using Dragon voice recognition software and may include unintentional dictation errors.

## 2018-11-23 NOTE — ED Provider Notes (Signed)
[facility] Emergency Department Provider Note   ____________________________________________   First MD Initiated Contact with Patient 11/23/18 1849     (approximate)  I have reviewed the triage vital signs and the nursing notes.   HISTORY  Chief Complaint Shortness of Breath    HPI Jenny Thomas is a 72 y.o. female reports longstanding history of COPD, hypertension prediabetes and asthma  For about 2 weeks now patient has a cough, occasionally productive with a lot of wheezing and shortness of breath.  Is been treated with amoxicillin and Levaquin also using her albuterol every 4-6 hours at home and prednisone without much relief.  He continues to be problematic especially short of breath with short walking.  No leg swelling.  No fever or chills.  Had a flu test that was negative this week  No pain except just feels little tight across her chest for about the last week and a half.  No chest pain.   Past Medical History:  Diagnosis Date  . Anxiety   . Asthma   . Back pain   . Constipation   . COPD (chronic obstructive pulmonary disease) ([place])   . Depression   . Dyspnea   . Gallbladder problem   . Hyperlipidemia   . Hypertension   . Hypothyroid   . Joint pain   . Leg edema   . Prediabetes     Patient Active Problem List   Diagnosis Date Noted  . Vitamin D deficiency 08/08/2017  . Type 2 diabetes mellitus without complication, without long-term current use of insulin ([place]) 07/25/2017  . Essential hypertension 07/25/2017  . Class 3 obesity without serious comorbidity with body mass index (BMI) of 40.0 to 44.9 in adult 07/25/2017    Past Surgical History:  Procedure Laterality Date  . ABDOMINAL HYSTERECTOMY    . CHOLECYSTECTOMY    . hernia repair    . LAPAROSCOPIC GASTRIC BANDING      Prior to Admission medications   Medication Sig Start Date End Date Taking? Authorizing Provider  ACCU-CHEK FASTCLIX LANCETS MISC 1 each by Does  not apply route 2 (two) times daily. 06/26/18   Dennard Nip D, MD  albuterol (ACCUNEB) 0.63 MG/3ML nebulizer solution Take 1 ampule by nebulization every 6 (six) hours as needed for wheezing.    [provider]  albuterol (PROVENTIL HFA;VENTOLIN HFA) 108 (90 Base) MCG/ACT inhaler Inhale into the lungs every 6 (six) hours as needed for wheezing or shortness of breath.    [provider]  alendronate (FOSAMAX) 70 MG tablet Take 70 mg by mouth once a week. Take with a full glass of water on an empty stomach.    [provider]  Blood Glucose Monitoring Suppl (ACCU-CHEK NANO SMARTVIEW) w/Device KIT 1 each by Does not apply route daily. Check blood sugar twice a day. 07/26/17   Dennard Nip D, MD  busPIRone (BUSPAR) 5 MG tablet Take 5 mg by mouth 3 (three) times daily.    [provider]  Calcium Carb-Cholecalciferol (CALCIUM/VITAMIN D) 500-200 MG-UNIT TABS Take by mouth daily. Take one tablet by mouth once a day    [provider]  citalopram (CELEXA) 40 MG tablet Take 40 mg by mouth daily.    [provider]  Fluticasone-Salmeterol (ADVAIR) 250-50 MCG/DOSE AEPB Inhale 1 puff into the lungs 2 (two) times daily.    [provider]  furosemide (LASIX) 20 MG tablet Take 20 mg by mouth daily as needed.    [provider]  levothyroxine (SYNTHROID, LEVOTHROID) 88 MCG tablet Take 88 mcg by mouth daily before breakfast.    [provider]  losartan (COZAAR) 50 MG tablet Take 1 tablet (50 mg total) by mouth daily. 08/22/17   Lacy Duverney M, PA-C  montelukast (SINGULAIR) 10 MG tablet Take 10 mg by mouth at bedtime.    [provider]  polyethylene glycol powder (GLYCOLAX/MIRALAX) powder Take 17 g by mouth daily. 06/26/18   Dennard Nip D, MD  potassium chloride SA (K-DUR,KLOR-CON) 20 MEQ tablet Take 20 mEq by mouth once.    [provider]  simvastatin (ZOCOR) 20 MG tablet Take 20 mg by mouth daily.    [provider]  Vitamin D, Ergocalciferol, (DRISDOL) 1.25 MG (50000 UT) CAPS capsule Take 1 capsule (50,000 Units total) by mouth every 7 (seven) days. 11/05/18   Abby Potash, PA-C    Allergies Percocet [oxycodone-acetaminophen]  Family History  Problem Relation Age of Onset  . Breast cancer Maternal Aunt   . Hypertension Mother   . Kidney disease Mother   . Obesity Mother   . Stroke Father   . Heart disease Father   . Alcoholism Father     Social History Social History   Tobacco Use  . Smoking status: Never Smoker  . Smokeless tobacco: Never Used  Substance Use Topics  . Alcohol use: Not on file  . Drug use: Not on file    Review of Systems Constitutional: No fever/chills Eyes: No visual changes. ENT: No sore throat. Cardiovascular: See HPI Respiratory: See HPI Gastrointestinal: No abdominal pain.   Genitourinary: Negative for dysuria. Musculoskeletal: Negative for back pain. Skin: Negative for rash. Neurological: Negative for headaches, areas of focal weakness or numbness.    ____________________________________________   PHYSICAL EXAM:  VITAL SIGNS: ED Triage Vitals  Enc Vitals Group     BP 11/23/18 1844 133/80     Pulse Rate 11/23/18 1844 (!) 134     Resp 11/23/18 1844 (!) 27     Temp 11/23/18 1844 98.7 F (37.1 C)     Temp Source 11/23/18 1844 Oral     SpO2 11/23/18 1844 93 %     Weight 11/23/18 1842 214 lb 15.2 oz (97.5 kg)     Height 11/23/18 1842 _0  (1.499 m)     Head Circumference --      Peak Flow --      Pain Score 11/23/18 1842 0     Pain Loc --      Pain Edu? --      Excl. in [place]? --     Constitutional: Alert and oriented. Well appearing and in no acute distress. Eyes: Conjunctivae are normal. Head: Atraumatic. Nose: No congestion/rhinnorhea. Mouth/Throat: Mucous membranes are moist. Neck: No stridor.  Cardiovascular: Normal rate, regular rhythm. Grossly normal heart sounds.  Good peripheral circulation. Respiratory:  Slight tachypnea.  Moderate end expiratory wheezing throughout.  No focal rales.  Speaks in phrases and pleasant. Gastrointestinal: Soft and nontender. No distention. Musculoskeletal: No lower extremity tenderness nor edema. Neurologic:  Normal speech and language. No gross focal neurologic deficits are appreciated.  Skin:  Skin is warm, dry and intact. No rash noted. Psychiatric: Mood and affect are normal. Speech and behavior are normal.  ____________________________________________   LABS (all labs ordered are listed, but only abnormal results are displayed)  Labs Reviewed  BASIC METABOLIC PANEL - Abnormal; Notable for the following components:      Result Value   Glucose,  Bld 104 (*)    BUN 24 (*)    Creatinine, Ser 1.26 (*)    Calcium 8.6 (*)    GFR calc non Af Amer 43 (*)    GFR calc Af Amer 50 (*)    All other components within normal limits  TROPONIN I - Abnormal; Notable for the following components:   Troponin I 0.39 (*)    All other components within normal limits  BRAIN NATRIURETIC PEPTIDE - Abnormal; Notable for the following components:   B Natriuretic Peptide 234.0 (*)    All other components within normal limits  CBC   ____________________________________________  EKG  Reviewed and interpreted by me 1847 Heart rate 100 QRS 90 QTc 549 sinus tachycardia, no evidence of acute ischemia noted, though there are possibly some old Q's V1 V2. ____________________________________________  RADIOLOGY  Dg Chest Portable 1 View  Result Date: 11/23/2018 CLINICAL DATA:  Initial evaluation for acute try cough, shortness of breath. EXAM: PORTABLE CHEST 1 VIEW COMPARISON:  Prior radiograph from 03/07/2013. FINDINGS: The cardiac and mediastinal silhouettes are stable in size and contour, and remain within normal limits. The lungs are normally inflated. No airspace consolidation, pleural effusion, or pulmonary edema is identified. There is no pneumothorax. No acute osseous  abnormality identified. IMPRESSION: No radiographic evidence for active cardiopulmonary disease. Electronically Signed   By: Jeannine Boga M.D.   On: 11/23/2018 19:17    X-ray reviewed, negative for acute  CT angiogram of chest is pending at the time of signout.  Dr. Jannifer Franklin to the hospital service will follow up ____________________________________________   PROCEDURES  Procedure(s) performed: None  Procedures  Critical Care performed: None   ____________________________________________   INITIAL IMPRESSION / ASSESSMENT AND PLAN / ED COURSE  Pertinent labs & imaging results that were available during my care of the patient were reviewed by me and considered in my medical decision making (see chart for details).   Patient presents with shortness of breath, ongoing cough for 2 weeks.  Notable wheezing.  Appears not to be improving with traditional treatments including multiple antibiotics.  No infiltrate on x-ray.  Reassuring lab work, except her troponin is notably elevated.  EKG does not show any obvious acute ischemia.  She is not complaining of chest pain but rather feeling of tightness and wheezing.  Suspect this is likely demand ischemia, but will proceed with CT angiogram to exclude pulmonary embolism in the setting.  Does not appear acutely volume overloaded.  Patient given nebs, afterwards improved but when she got up to walk short distance she did drop her saturation to 93%, and became tachycardic once again to the 130s.  Wheezing again will give additional neb.  Patient agrees with plan for admission, after monitor heart rate returned to 90, pulse oximetry 95% on room air.      ____________________________________________   FINAL CLINICAL IMPRESSION(S) / ED DIAGNOSES  Final diagnoses:  Cardiac ischemia  COPD with acute exacerbation [facility])        Note:  This document was prepared using Dragon voice recognition software and may include unintentional  dictation errors       Delman Kitten, MD 11/23/18 [phone]

## 2018-11-24 ENCOUNTER — Encounter (INDEPENDENT_AMBULATORY_CARE_PROVIDER_SITE_OTHER): Payer: Self-pay | Admitting: Physician Assistant

## 2018-11-24 ENCOUNTER — Inpatient Hospital Stay: Payer: Self-pay

## 2018-11-24 ENCOUNTER — Inpatient Hospital Stay: Payer: Medicare HMO

## 2018-11-24 ENCOUNTER — Inpatient Hospital Stay (HOSPITAL_COMMUNITY)
Admit: 2018-11-24 | Discharge: 2018-11-24 | Disposition: A | Discharge status: Home / Self Care | Payer: Medicare HMO | Attending: Internal Medicine | Admitting: Internal Medicine

## 2018-11-24 DIAGNOSIS — I361 Nonrheumatic tricuspid (valve) insufficiency: Secondary | ICD-10-CM

## 2018-11-24 LAB — TROPONIN I
Troponin I: 0.14 ng/mL (ref <0.03)
Troponin I: 0.09 ng/mL (ref <0.03)
Troponin I: 0.1 ng/mL (ref <0.03)

## 2018-11-24 LAB — BASIC METABOLIC PANEL
Anion gap: 9 (ref 5–15)
BUN: 23 mg/dL (ref 8–23)
CALCIUM: 8.4 mg/dL — AB (ref 8.9–10.3)
CO2: 25 mmol/L (ref 22–32)
Chloride: 104 mmol/L (ref 98–111)
Creatinine, Ser: 1.18 mg/dL — ABNORMAL HIGH (ref 0.44–1.00)
GFR, EST AFRICAN AMERICAN: 54 mL/min — AB (ref >60)
GFR, EST NON AFRICAN AMERICAN: 47 mL/min — AB (ref >60)
Glucose, Bld: 212 mg/dL — ABNORMAL HIGH (ref 70–99)
Potassium: 3.8 mmol/L (ref 3.5–5.1)
Sodium: 138 mmol/L (ref 135–145)

## 2018-11-24 LAB — GLUCOSE, CAPILLARY
Glucose-Capillary: 160 mg/dL — ABNORMAL HIGH (ref 70–99)
Glucose-Capillary: 159 mg/dL — ABNORMAL HIGH (ref 70–99)
Glucose-Capillary: 142 mg/dL — ABNORMAL HIGH (ref 70–99)
Glucose-Capillary: 154 mg/dL — ABNORMAL HIGH (ref 70–99)

## 2018-11-24 LAB — PROTIME-INR
INR: 1.01
Prothrombin Time: 13.2 seconds (ref 11.4–15.2)

## 2018-11-24 LAB — APTT: aPTT: 34 seconds (ref 24–36)

## 2018-11-24 LAB — HEPARIN LEVEL (UNFRACTIONATED)
Heparin Unfractionated: 1.41 IU/mL — ABNORMAL HIGH (ref 0.30–0.70)
Heparin Unfractionated: 0.75 IU/mL — ABNORMAL HIGH (ref 0.30–0.70)

## 2018-11-24 LAB — CBC
HCT: 41.9 % (ref 36.0–46.0)
Hemoglobin: 13.6 g/dL (ref 12.0–15.0)
MCH: 29.8 pg (ref 26.0–34.0)
MCHC: 32.5 g/dL (ref 30.0–36.0)
MCV: 91.9 fL (ref 80.0–100.0)
Platelets: 226 10*3/uL (ref 150–400)
RBC: 4.56 MIL/uL (ref 3.87–5.11)
RDW: 13.5 % (ref 11.5–15.5)
WBC: 9 10*3/uL (ref 4.0–10.5)
nRBC: 0 % (ref 0.0–0.2)

## 2018-11-24 LAB — ECHOCARDIOGRAM COMPLETE
HEIGHTINCHES: 58 in
WEIGHTICAEL: 3500.8 [oz_av]

## 2018-11-24 MED ORDER — ACETAMINOPHEN 650 MG RE SUPP: 650.0000 mg | Freq: Four times a day (QID) | RECTAL | Status: DC | PRN

## 2018-11-24 MED ORDER — SIMVASTATIN 20 MG PO TABS
20.0000 mg | ORAL_TABLET | Freq: Every day | ORAL | Status: DC
  Administered 2018-11-24 – 2018-11-25 (×3): 20 mg via ORAL
  Filled 2018-11-24 (×3): qty 1

## 2018-11-24 MED ORDER — MOMETASONE FURO-FORMOTEROL FUM 200-5 MCG/ACT IN AERO
2.0000 | INHALATION_SPRAY | Freq: Two times a day (BID) | RESPIRATORY_TRACT | Status: DC
  Administered 2018-11-24 – 2018-11-26 (×3): 2 via RESPIRATORY_TRACT
  Filled 2018-11-24: qty 8.8

## 2018-11-24 MED ORDER — CITALOPRAM HYDROBROMIDE 20 MG PO TABS
40.0000 mg | ORAL_TABLET | Freq: Every day | ORAL | Status: DC
  Administered 2018-11-24 – 2018-11-26 (×3): 40 mg via ORAL
  Filled 2018-11-24 (×3): qty 2

## 2018-11-24 MED ORDER — SODIUM CHLORIDE 0.9% FLUSH
10.0000 mL | Freq: Two times a day (BID) | INTRAVENOUS | Status: DC
  Administered 2018-11-24: 10 mL
  Administered 2018-11-25: 20 mL
  Administered 2018-11-26: 10 mL

## 2018-11-24 MED ORDER — ONDANSETRON HCL 4 MG PO TABS: 4.0000 mg | ORAL_TABLET | Freq: Four times a day (QID) | ORAL | Status: DC | PRN

## 2018-11-24 MED ORDER — LEVOTHYROXINE SODIUM 88 MCG PO TABS
88.0000 ug | ORAL_TABLET | Freq: Every day | ORAL | Status: DC
  Administered 2018-11-24 – 2018-11-26 (×3): 88 ug via ORAL
  Filled 2018-11-24 (×3): qty 1

## 2018-11-24 MED ORDER — IPRATROPIUM-ALBUTEROL 0.5-2.5 (3) MG/3ML IN SOLN
3.0000 mL | RESPIRATORY_TRACT | Status: DC | PRN
  Administered 2018-11-24 – 2018-11-25 (×4): 3 mL via RESPIRATORY_TRACT
  Filled 2018-11-24 (×6): qty 3

## 2018-11-24 MED ORDER — HEPARIN (PORCINE) 25000 UT/250ML-% IV SOLN
800.0000 [IU]/h | INTRAVENOUS | Status: DC
  Administered 2018-11-24 (×2): 900 [IU]/h via INTRAVENOUS
  Administered 2018-11-26: 800 [IU]/h via INTRAVENOUS
  Filled 2018-11-24 (×2): qty 250

## 2018-11-24 MED ORDER — ACETAMINOPHEN 325 MG PO TABS
650.0000 mg | ORAL_TABLET | Freq: Four times a day (QID) | ORAL | Status: DC | PRN
  Administered 2018-11-24 – 2018-11-25 (×2): 650 mg via ORAL
  Filled 2018-11-24 (×2): qty 2

## 2018-11-24 MED ORDER — INSULIN ASPART 100 UNIT/ML ~~LOC~~ SOLN
0.0000 [IU] | Freq: Three times a day (TID) | SUBCUTANEOUS | Status: DC
  Administered 2018-11-24 (×3): 2 [IU] via SUBCUTANEOUS
  Administered 2018-11-25: 1 [IU] via SUBCUTANEOUS
  Filled 2018-11-24 (×4): qty 1

## 2018-11-24 MED ORDER — MONTELUKAST SODIUM 10 MG PO TABS
10.0000 mg | ORAL_TABLET | Freq: Every day | ORAL | Status: DC
  Administered 2018-11-24 – 2018-11-25 (×3): 10 mg via ORAL
  Filled 2018-11-24 (×3): qty 1

## 2018-11-24 MED ORDER — BUSPIRONE HCL 5 MG PO TABS
5.0000 mg | ORAL_TABLET | Freq: Two times a day (BID) | ORAL | Status: DC
  Administered 2018-11-24 – 2018-11-26 (×5): 5 mg via ORAL
  Filled 2018-11-24 (×7): qty 1

## 2018-11-24 MED ORDER — SODIUM CHLORIDE 0.9% FLUSH: 10.0000 mL | INTRAVENOUS | Status: DC | PRN

## 2018-11-24 MED ORDER — ONDANSETRON HCL 4 MG/2ML IJ SOLN: 4.0000 mg | Freq: Four times a day (QID) | INTRAMUSCULAR | Status: DC | PRN

## 2018-11-24 NOTE — Progress Notes (Signed)
ANTICOAGULATION CONSULT NOTE - Follow Up Consult  Pharmacy Consult for heparin drip Indication: pulmonary embolus  Allergies  Allergen Reactions  . Percocet [Oxycodone-Acetaminophen] Nausea And Vomiting    Patient Measurements: Height: 4\' 10"  (147.3 cm) Weight: 218 lb 12.8 oz (99.2 kg) IBW/kg (Calculated) : 40.9 Heparin Dosing Weight: 67kg  Vital Signs: Temp: 97.8 F (36.6 C) (12/14 0824) Temp Source: Oral (12/14 0824) BP: 136/63 (12/14 0824) Pulse Rate: 81 (12/14 0824)  Labs: Recent Labs    11/23/18 1946 11/23/18 2246 11/23/18 2359 11/24/18 0357 11/24/18 0803 11/24/18 1118 11/24/18 1836  HGB  --  13.4  --  13.6  --   --   --   HCT  --  40.5  --  41.9  --   --   --   PLT  --  221  --  226  --   --   --   APTT  --   --  34  --   --   --   --   LABPROT  --   --  13.2  --   --   --   --   INR  --   --  1.01  --   --   --   --   HEPARINUNFRC  --   --   --   --  1.41*  --  0.75*  CREATININE 1.26*  --   --  1.18*  --   --   --   TROPONINI 0.39* 0.22*  --  0.14*  --  0.09* 0.10*    Estimated Creatinine Clearance: 45 mL/min (A) (by C-G formula based on SCr of 1.18 mg/dL (H)).   Medical History: Past Medical History:  Diagnosis Date  . Anxiety   . Asthma   . Back pain   . Constipation   . COPD (chronic obstructive pulmonary disease) ([place])   . Depression   . Dyspnea   . Gallbladder problem   . Hyperlipidemia   . Hypertension   . Hypothyroid   . Joint pain   . Leg edema   . Prediabetes     Medications:  No anticoag in PTA meds.  Assessment: Pharmacy consulted for heparin drip dosing and monitoring for PE.   12/14 AM: Heparin 4000 unit bolus and infusion started @ 1150 units/hr 12/14 0900 Heparin HL 1.41. Infusion decreased to 900 units/hr.   Goal of Therapy:  Heparin level 0.3-0.7 units/ml Monitor platelets by anticoagulation protocol: Yes   Plan:  12/14 @ 1919 HL: 0.75. Level just slightly supratherapeutic. Will decreased infusion to 800 units/hr.   Recheck HL in 8 hours. CBC with AM labs per protocol.   Pernell Dupre, PharmD, BCPS Clinical Pharmacist 11/24/2018 7:55 PM

## 2018-11-24 NOTE — Progress Notes (Signed)
   11/24/18 1900  Clinical Encounter Type  Visited With Patient;Patient and family together  Visit Type Initial;Follow-up  Referral From Nurse  Consult/Referral To Chaplain  Stress Factors  Patient Stress Factors Other (Comment)  Family Stress Factors Exhausted  Advance Directives (For Healthcare)  Does Patient Have a Medical Advance Directive? No  Would patient like information on creating a medical advance directive? Yes (Inpatient - patient requests chaplain consult to create a medical advance directive);Yes (Inpatient - patient defers creating a medical advance directive at this time)

## 2018-11-24 NOTE — Progress Notes (Signed)
ANTICOAGULATION CONSULT NOTE - Follow Up Consult  Pharmacy Consult for heparin drip Indication: pulmonary embolus  Allergies  Allergen Reactions  . Percocet [Oxycodone-Acetaminophen] Nausea And Vomiting    Patient Measurements: Height: 4\' 10"  (147.3 cm) Weight: 218 lb 12.8 oz (99.2 kg) IBW/kg (Calculated) : 40.9 Heparin Dosing Weight: 67kg  Vital Signs: Temp: 97.8 F (36.6 C) (12/14 0824) Temp Source: Oral (12/14 0824) BP: 136/63 (12/14 0824) Pulse Rate: 81 (12/14 0824)  Labs: Recent Labs    11/23/18 1946 11/23/18 2246 11/23/18 2359 11/24/18 0357 11/24/18 0803  HGB  --  13.4  --  13.6  --   HCT  --  40.5  --  41.9  --   PLT  --  221  --  226  --   APTT  --   --  34  --   --   LABPROT  --   --  13.2  --   --   INR  --   --  1.01  --   --   HEPARINUNFRC  --   --   --   --  1.41*  CREATININE 1.26*  --   --  1.18*  --   TROPONINI 0.39* 0.22*  --  0.14*  --     Estimated Creatinine Clearance: 45 mL/min (A) (by C-G formula based on SCr of 1.18 mg/dL (H)).   Medical History: Past Medical History:  Diagnosis Date  . Anxiety   . Asthma   . Back pain   . Constipation   . COPD (chronic obstructive pulmonary disease) ([place])   . Depression   . Dyspnea   . Gallbladder problem   . Hyperlipidemia   . Hypertension   . Hypothyroid   . Joint pain   . Leg edema   . Prediabetes     Medications:  No anticoag in PTA meds.  Assessment:  Goal of Therapy:  Heparin level 0.3-0.7 units/ml Monitor platelets by anticoagulation protocol: Yes   Plan:  12/14:  4000 unit bolus and initial rate of 1150 units/hr.   12/14 at 09:30  HL = 1.41.   Stop infusion for 60 minutes.  Restart at 900 units/hr.   Recheck HL tonight at 19:00.  Kellis Topete K, [place] 11/24/2018,9:35 AM

## 2018-11-24 NOTE — Progress Notes (Signed)
Spoke with RN re PICC.  Plan to place PICC this PM.

## 2018-11-24 NOTE — Progress Notes (Signed)
Advanced Care Plan.  Purpose of Encounter: CODE STATUS. Parties in Attendance: The patient, her daughter and me. Patient's Decisional Capacity: Yes. Medical Story: Jenny Thomas  is a 72 y.o. female  with history of COPD, hypertension, diabetes, hyperlipidemia, hypothyroidism, anxiety and back pain.  The patient is admitted for acute respiratory failure with hypoxia due to bilateral pulmonary embolism.  I discussed with the patient about her current condition, prognosis and CODE STATUS.  The patient wants to be resuscitated and intubated if she has cardiopulmonary arrest.  But she does not want to be on ventilation with vegetation status. Plan:  Code Status: Full code. Time spent discussing advance care planning: 18 minutes.

## 2018-11-24 NOTE — Progress Notes (Signed)
Peripherally Inserted Central Catheter/Midline Placement  The IV Nurse has discussed with the patient and/or persons authorized to consent for the patient, the purpose of this procedure and the potential benefits and risks involved with this procedure.  The benefits include less needle sticks, lab draws from the catheter, and the patient may be discharged home with the catheter. Risks include, but not limited to, infection, bleeding, blood clot (thrombus formation), and puncture of an artery; nerve damage and irregular heartbeat and possibility to perform a PICC exchange if needed/ordered by physician.  Alternatives to this procedure were also discussed.  Bard Power PICC patient education guide, fact sheet on infection prevention and patient information card has been provided to patient /or left at bedside.    PICC/Midline Placement Documentation  PICC Single Lumen 11/24/18 PICC Right Cephalic 39 cm 0 cm (Active)  Indication for Insertion or Continuance of Line Limited venous access - need for IV therapy >5 days (PICC only) 11/24/2018  4:49 PM  Exposed Catheter (cm) 0 cm 11/24/2018  4:49 PM  Site Assessment Clean;Dry;Intact 11/24/2018  4:49 PM  Line Status Flushed;Saline locked;Blood return noted 11/24/2018  4:49 PM  Dressing Type Transparent 11/24/2018  4:49 PM  Dressing Status Clean;Dry;Intact;Antimicrobial disc in place 11/24/2018  4:49 PM  Line Care Connections checked and tightened 11/24/2018  4:49 PM  Line Adjustment (NICU/IV Team Only) No 11/24/2018  4:49 PM  Dressing Intervention New dressing 11/24/2018  4:49 PM  Dressing Change Due 12/01/18 11/24/2018  4:49 PM       Rolena Infante 11/24/2018, 4:50 PM

## 2018-11-24 NOTE — Progress Notes (Addendum)
[facility] [facility] [place] NAME: Jenny Thomas    MR#:  [id number]  DATE OF BIRTH:  Apr 23, 1946  SUBJECTIVE:  CHIEF COMPLAINT:   Chief Complaint  Patient presents with  . Shortness of Breath   Cough and shortness of breath.  On oxygen 2 L. REVIEW OF SYSTEMS:  Review of Systems  Constitutional: Negative for chills, fever and malaise/fatigue.  HENT: Negative for sore throat.   Eyes: Negative for blurred vision and double vision.  Respiratory: Positive for cough and wheezing. Negative for hemoptysis, sputum production, shortness of breath and stridor.   Cardiovascular: Negative for chest pain, palpitations, orthopnea and leg swelling.  Gastrointestinal: Negative for abdominal pain, blood in stool, diarrhea, melena, nausea and vomiting.  Genitourinary: Negative for dysuria, flank pain and hematuria.  Musculoskeletal: Negative for back pain and joint pain.  Skin: Negative for rash.  Neurological: Negative for dizziness, sensory change, focal weakness, seizures, loss of consciousness, weakness and headaches.  Endo/Heme/Allergies: Negative for polydipsia.  Psychiatric/Behavioral: Negative for depression. The patient is not nervous/anxious.     DRUG ALLERGIES:   Allergies  Allergen Reactions  . Percocet [Oxycodone-Acetaminophen] Nausea And Vomiting   VITALS:  Blood pressure 136/63, pulse 81, temperature 97.8 F (36.6 C), temperature source Oral, resp. rate 16, height 4\' 10"  (1.473 m), weight 99.2 kg, SpO2 96 %. PHYSICAL EXAMINATION:  Physical Exam Constitutional:      General: She is not in acute distress.    Appearance: She is well-developed.     Comments: Morbid obesity.  HENT:     Head: Normocephalic.     Mouth/Throat:     Mouth: Mucous membranes are moist.  Eyes:     General: No scleral icterus.    Conjunctiva/sclera: Conjunctivae normal.     Pupils: Pupils are equal, round, and reactive to light.  Neck:     Musculoskeletal:  Normal range of motion and neck supple.     Vascular: No JVD.     Trachea: No tracheal deviation.  Cardiovascular:     Rate and Rhythm: Normal rate and regular rhythm.     Heart sounds: Normal heart sounds. No murmur. No gallop.   Pulmonary:     Effort: Pulmonary effort is normal. No respiratory distress.     Breath sounds: No stridor. Wheezing present. No rales.  Abdominal:     General: Bowel sounds are normal. There is no distension.     Palpations: Abdomen is soft.     Tenderness: There is no abdominal tenderness. There is no rebound.  Musculoskeletal: Normal range of motion.        General: No tenderness.  Skin:    Findings: No erythema or rash.  Neurological:     General: No focal deficit present.     Mental Status: She is alert and oriented to person, place, and time.     Cranial Nerves: No cranial nerve deficit.  Psychiatric:        Mood and Affect: Mood normal.    LABORATORY PANEL:  Female CBC Recent Labs  Lab 11/24/18 0357  WBC 9.0  HGB 13.6  HCT 41.9  PLT 226   ------------------------------------------------------------------------------------------------------------------ Chemistries  Recent Labs  Lab 11/24/18 0357  NA 138  K 3.8  CL 104  CO2 25  GLUCOSE 212*  BUN 23  CREATININE 1.18*  CALCIUM 8.4*   RADIOLOGY:  Ct Angio Chest Pe W And/or Wo Contrast  Result Date: 11/23/2018 CLINICAL DATA:  Chest pain and shortness  of breath. EXAM: CT ANGIOGRAPHY CHEST WITH CONTRAST TECHNIQUE: Multidetector CT imaging of the chest was performed using the standard protocol during bolus administration of intravenous contrast. Multiplanar CT image reconstructions and MIPs were obtained to evaluate the vascular anatomy. CONTRAST:  28mL OMNIPAQUE IOHEXOL 350 MG/ML SOLN COMPARISON:  None. FINDINGS: Cardiovascular: --Pulmonary arteries: Contrast injection is sufficient to demonstrate satisfactory opacification of the pulmonary arteries to the segmental level.There are  bilateral pulmonary emboli, predominantly on the right side. There are filling defects within the right upper lobe pulmonary artery, extending into the segmental branches; within the proximal right lower lobe segmental branches and within the right middle lobe segmental branches. On the left, there are filling defects within the segmental branches of the left lower lobe. There is straightening of the interventricular septum with a RV:LV ratio of approximately 1. The main pulmonary artery is within normal limits for size. --Aorta: Satisfactory opacification of the thoracic aorta. No aortic dissection or other acute aortic syndrome. Conventional 3 vessel aortic branching pattern. The aortic course and caliber are normal. There is no aortic atherosclerosis. --Heart: Normal size. No pericardial effusion. Mediastinum/Nodes: No mediastinal, hilar or axillary lymphadenopathy. The visualized thyroid and thoracic esophageal course are unremarkable. Lungs/Pleura: No pulmonary nodules or masses. No pleural effusion or pneumothorax. No focal airspace consolidation. No focal pleural abnormality. Upper Abdomen: Contrast bolus timing is not optimized for evaluation of the abdominal organs. Within this limitation, the visualized organs of the upper abdomen are normal. Musculoskeletal: No chest wall abnormality. No acute or significant osseous findings. Review of the MIP images confirms the above findings. IMPRESSION: 1. Bilateral pulmonary emboli, right greater than left, involving segmental branches of all lobes of the right lung and the left lower lobe. 2. CT findings equivocal for right heart strain with straightening of the interventricular septum and RV:LV ratio of approximately 1.0. Critical Value/emergent results were called by telephone at the time of interpretation on 11/23/2018 at 10:50 pm to Dr. Clearnce Hasten, who verbally acknowledged these results. Electronically Signed   By: Ulyses Jarred M.D.   On: 11/23/2018 22:53    US Venous Img Lower Bilateral  Result Date: 11/24/2018 CLINICAL DATA:  Pulmonary embolism. History of prior DVT. Evaluate for acute or chronic DVT. EXAM: BILATERAL LOWER EXTREMITY VENOUS DOPPLER ULTRASOUND TECHNIQUE: Gray-scale sonography with graded compression, as well as color Doppler and duplex ultrasound were performed to evaluate the lower extremity deep venous systems from the level of the common femoral vein and including the common femoral, femoral, profunda femoral, popliteal and calf veins including the posterior tibial, peroneal and gastrocnemius veins when visible. The superficial great saphenous vein was also interrogated. Spectral Doppler was utilized to evaluate flow at rest and with distal augmentation maneuvers in the common femoral, femoral and popliteal veins. COMPARISON:  Chest CTA-11/23/2018; left lower extremity venous Doppler ultrasound-07/12/2018 FINDINGS: RIGHT LOWER EXTREMITY Common Femoral Vein: No evidence of thrombus. Normal compressibility, respiratory phasicity and response to augmentation. Saphenofemoral Junction: No evidence of thrombus. Normal compressibility and flow on color Doppler imaging. Profunda Femoral Vein: No evidence of thrombus. Normal compressibility and flow on color Doppler imaging. Femoral Vein: No evidence of thrombus. Normal compressibility, respiratory phasicity and response to augmentation. Popliteal Vein: No evidence of thrombus. Normal compressibility, respiratory phasicity and response to augmentation. Calf Veins: No evidence of thrombus. Normal compressibility and flow on color Doppler imaging. Superficial Great Saphenous Vein: No evidence of thrombus. Normal compressibility. Venous Reflux:  None. Other Findings:  None. LEFT LOWER EXTREMITY Common Femoral Vein: No evidence  of thrombus. Normal compressibility, respiratory phasicity and response to augmentation. Saphenofemoral Junction: No evidence of thrombus. Normal compressibility and flow on color  Doppler imaging. Profunda Femoral Vein: No evidence of thrombus. Normal compressibility and flow on color Doppler imaging. Femoral Vein: No evidence of thrombus. Normal compressibility, respiratory phasicity and response to augmentation. Popliteal Vein: No evidence of thrombus. Normal compressibility, respiratory phasicity and response to augmentation. Calf Veins: No evidence of thrombus. Normal compressibility and flow on color Doppler imaging. Superficial Great Saphenous Vein: No evidence of thrombus. Normal compressibility. Venous Reflux:  None. Other Findings:  None. IMPRESSION: No evidence of acute or chronic DVT within either lower extremity. Electronically Signed   By: Sandi Mariscal M.D.   On: 11/24/2018 11:21   Dg Chest Portable 1 View  Result Date: 11/23/2018 CLINICAL DATA:  Initial evaluation for acute try cough, shortness of breath. EXAM: PORTABLE CHEST 1 VIEW COMPARISON:  Prior radiograph from 03/07/2013. FINDINGS: The cardiac and mediastinal silhouettes are stable in size and contour, and remain within normal limits. The lungs are normally inflated. No airspace consolidation, pleural effusion, or pulmonary edema is identified. There is no pneumothorax. No acute osseous abnormality identified. IMPRESSION: No radiographic evidence for active cardiopulmonary disease. Electronically Signed   By: Jeannine Boga M.D.   On: 11/23/2018 19:17   [name] Ekg Site Rite  Result Date: 11/24/2018 If Site Rite image not attached, placement could not be confirmed due to current cardiac rhythm.  ASSESSMENT AND PLAN:   Acute respiratory failure with hypoxia due to Bilateral pulmonary embolism (HCC)  Continue IV heparin, change to oral anticoagulation prior to discharge and can follow-up with her pulmonologist. Venous duplex of bilateral lower extremity did not show any DVT.  Elevated troponin.  Due to demanding ischemia.  Continue heparin drip.  Echocardiogram reported LV EF: 55% -   60%.    Type 2  diabetes mellitus without complication, without long-term current use of insulin (HCC) -sliding scale insulin coverage    Essential hypertension -home dose antihypertensives    COPD (chronic obstructive pulmonary disease) ([place]) -continue home meds and nebulizer PRN.    HLD (hyperlipidemia) -Home dose antilipid   Hypothyroidism -home dose thyroid replacement  All the records are reviewed and case discussed with Care Management/Social Worker. Management plans discussed with the patient, her daughter and they are in agreement.  CODE STATUS: Full Code  TOTAL TIME TAKING CARE OF THIS PATIENT: 28 minutes.   More than 50% of the time was spent in counseling/coordination of care: YES  POSSIBLE D/C IN 2 DAYS, DEPENDING ON CLINICAL CONDITION.   Demetrios Loll M.D on 11/24/2018 at 3:55 PM  Between 7am to 6pm - Pager - [phone]  After 6pm go to [email or web address] - [facility]

## 2018-11-24 NOTE — Progress Notes (Signed)
Pt is difficult stick and refuses to have PIV removed at this time.  Pt understands risks, labs sent.  [id number]

## 2018-11-25 LAB — GLUCOSE, CAPILLARY
GLUCOSE-CAPILLARY: 114 mg/dL — AB (ref 70–99)
Glucose-Capillary: 133 mg/dL — ABNORMAL HIGH (ref 70–99)

## 2018-11-25 LAB — HEPARIN LEVEL (UNFRACTIONATED)
Heparin Unfractionated: 0.43 IU/mL (ref 0.30–0.70)
Heparin Unfractionated: 0.56 IU/mL (ref 0.30–0.70)

## 2018-11-25 LAB — HIV ANTIBODY (ROUTINE TESTING W REFLEX): HIV Screen 4th Generation wRfx: NONREACTIVE

## 2018-11-25 LAB — CBC
HEMATOCRIT: 37.5 % (ref 36.0–46.0)
HEMOGLOBIN: 12.1 g/dL (ref 12.0–15.0)
MCH: 30.2 pg (ref 26.0–34.0)
MCHC: 32.3 g/dL (ref 30.0–36.0)
MCV: 93.5 fL (ref 80.0–100.0)
Platelets: 228 10*3/uL (ref 150–400)
RBC: 4.01 MIL/uL (ref 3.87–5.11)
RDW: 14 % (ref 11.5–15.5)
WBC: 13.7 10*3/uL — ABNORMAL HIGH (ref 4.0–10.5)
nRBC: 0 % (ref 0.0–0.2)

## 2018-11-25 MED ORDER — BLISTEX MEDICATED EX OINT
TOPICAL_OINTMENT | CUTANEOUS | Status: DC | PRN
  Administered 2018-11-25: 22:00:00 via TOPICAL
  Filled 2018-11-25: qty 6.3

## 2018-11-25 NOTE — Progress Notes (Signed)
[place] at [place] NAME: Jenny Thomas    MR#:  [id number]  DATE OF BIRTH:  January 04, 1946  SUBJECTIVE:  CHIEF COMPLAINT:   Chief Complaint  Patient presents with  . Shortness of Breath   No complaints, still on oxygen 2 L. REVIEW OF SYSTEMS:  Review of Systems  Constitutional: Negative for chills, fever and malaise/fatigue.  HENT: Negative for sore throat.   Eyes: Negative for blurred vision and double vision.  Respiratory: Negative for cough, hemoptysis, sputum production, shortness of breath, wheezing and stridor.   Cardiovascular: Negative for chest pain, palpitations, orthopnea and leg swelling.  Gastrointestinal: Negative for abdominal pain, blood in stool, diarrhea, melena, nausea and vomiting.  Genitourinary: Negative for dysuria, flank pain and hematuria.  Musculoskeletal: Negative for back pain and joint pain.  Skin: Negative for rash.  Neurological: Negative for dizziness, sensory change, focal weakness, seizures, loss of consciousness, weakness and headaches.  Endo/Heme/Allergies: Negative for polydipsia.  Psychiatric/Behavioral: Negative for depression. The patient is not nervous/anxious.     DRUG ALLERGIES:   Allergies  Allergen Reactions  . Percocet [Oxycodone-Acetaminophen] Nausea And Vomiting   VITALS:  Blood pressure 126/73, pulse 64, temperature 97.8 F (36.6 C), temperature source Oral, resp. rate 16, height 4\' 10"  (1.473 m), weight 101.8 kg, SpO2 99 %. PHYSICAL EXAMINATION:  Physical Exam Constitutional:      General: She is not in acute distress.    Appearance: She is well-developed.     Comments: Morbid obesity.  HENT:     Head: Normocephalic.     Mouth/Throat:     Mouth: Mucous membranes are moist.  Eyes:     General: No scleral icterus.    Conjunctiva/sclera: Conjunctivae normal.     Pupils: Pupils are equal, round, and reactive to light.  Neck:     Musculoskeletal: Normal range of motion and  neck supple.     Vascular: No JVD.     Trachea: No tracheal deviation.  Cardiovascular:     Rate and Rhythm: Normal rate and regular rhythm.     Heart sounds: Normal heart sounds. No murmur. No gallop.   Pulmonary:     Effort: Pulmonary effort is normal. No respiratory distress.     Breath sounds: No stridor. No wheezing or rales.  Abdominal:     General: Bowel sounds are normal. There is no distension.     Palpations: Abdomen is soft.     Tenderness: There is no abdominal tenderness. There is no rebound.  Musculoskeletal: Normal range of motion.        General: No tenderness.  Skin:    Findings: No erythema or rash.  Neurological:     General: No focal deficit present.     Mental Status: She is alert and oriented to person, place, and time.     Cranial Nerves: No cranial nerve deficit.  Psychiatric:        Mood and Affect: Mood normal.    LABORATORY PANEL:  Female CBC Recent Labs  Lab 11/25/18 0434  WBC 13.7*  HGB 12.1  HCT 37.5  PLT 228   ------------------------------------------------------------------------------------------------------------------ Chemistries  Recent Labs  Lab 11/24/18 0357  NA 138  K 3.8  CL 104  CO2 25  GLUCOSE 212*  BUN 23  CREATININE 1.18*  CALCIUM 8.4*   RADIOLOGY:  No results found. ASSESSMENT AND PLAN:   Acute respiratory failure with hypoxia due to Bilateral pulmonary embolism (HCC)  Try to wean off oxygen.  Continue IV heparin, change to oral anticoagulation prior to discharge and can follow-up with her pulmonologist. Venous duplex of bilateral lower extremity did not show any DVT.  Elevated troponin.  Due to demanding ischemia.  Continue heparin drip.  Echocardiogram reported LV EF: 55% -   60%.    Type 2 diabetes mellitus without complication, without long-term current use of insulin (HCC) -sliding scale insulin coverage    Essential hypertension -home dose antihypertensives    COPD (chronic obstructive pulmonary disease)  ([place]) -continue home meds and nebulizer PRN.    HLD (hyperlipidemia) -Home dose antilipid   Hypothyroidism -home dose thyroid replacement  All the records are reviewed and case discussed with Care Management/Social Worker. Management plans discussed with the patient, her daughter and they are in agreement.  CODE STATUS: Full Code  TOTAL TIME TAKING CARE OF THIS PATIENT: 28 minutes.   More than 50% of the time was spent in counseling/coordination of care: YES  POSSIBLE D/C IN 2 DAYS, DEPENDING ON CLINICAL CONDITION.   Demetrios Loll M.D on 11/25/2018 at 4:37 PM  Between 7am to 6pm - Pager - [phone]  After 6pm go to [email or web address] - [facility]

## 2018-11-25 NOTE — Progress Notes (Signed)
ANTICOAGULATION CONSULT NOTE - Follow Up Consult  Pharmacy Consult for heparin drip Indication: pulmonary embolus  Allergies  Allergen Reactions  . Percocet [Oxycodone-Acetaminophen] Nausea And Vomiting    Patient Measurements: Height: 4\' 10"  (147.3 cm) Weight: 224 lb 8 oz (101.8 kg) IBW/kg (Calculated) : 40.9 Heparin Dosing Weight: 67kg  Vital Signs: Temp: 97.8 F (36.6 C) (12/15 0800) Temp Source: Oral (12/15 0800) BP: 126/73 (12/15 0800) Pulse Rate: 64 (12/15 0800)  Labs: Recent Labs    11/23/18 1946  11/23/18 2246 11/23/18 2359 11/24/18 0357  11/24/18 1118 11/24/18 1836 11/25/18 0434 11/25/18 1137  HGB  --    < > 13.4  --  13.6  --   --   --  12.1  --   HCT  --   --  40.5  --  41.9  --   --   --  37.5  --   PLT  --   --  221  --  226  --   --   --  228  --   APTT  --   --   --  34  --   --   --   --   --   --   LABPROT  --   --   --  13.2  --   --   --   --   --   --   INR  --   --   --  1.01  --   --   --   --   --   --   HEPARINUNFRC  --   --   --   --   --    < >  --  0.75* 0.43 0.56  CREATININE 1.26*  --   --   --  1.18*  --   --   --   --   --   TROPONINI 0.39*  --  0.22*  --  0.14*  --  0.09* 0.10*  --   --    < > = values in this interval not displayed.    Estimated Creatinine Clearance: 45.7 mL/min (A) (by C-G formula based on SCr of 1.18 mg/dL (H)).   Medical History: Past Medical History:  Diagnosis Date  . Anxiety   . Asthma   . Back pain   . Constipation   . COPD (chronic obstructive pulmonary disease) ([place])   . Depression   . Dyspnea   . Gallbladder problem   . Hyperlipidemia   . Hypertension   . Hypothyroid   . Joint pain   . Leg edema   . Prediabetes     Medications:  No anticoag in PTA meds.  Assessment: Pharmacy consulted for heparin drip dosing and monitoring for PE.   Goal of Therapy:  Heparin level 0.3-0.7 units/ml Monitor platelets by anticoagulation protocol: Yes   Plan:  12/15 0430 HL 0.43. Continue current  regimen. Recheck heparin level in 6 hours to confirm   12/15 11:37  HL = 0.56.  Continue current drip rate. Recheck HL/CBC with am labs.   Olivia Canter [facility] Clinical Pharmacist 11/25/2018 12:22 PM

## 2018-11-25 NOTE — Progress Notes (Signed)
ANTICOAGULATION CONSULT NOTE - Follow Up Consult  Pharmacy Consult for heparin drip Indication: pulmonary embolus  Allergies  Allergen Reactions  . Percocet [Oxycodone-Acetaminophen] Nausea And Vomiting    Patient Measurements: Height: 4\' 10"  (147.3 cm) Weight: 218 lb 12.8 oz (99.2 kg) IBW/kg (Calculated) : 40.9 Heparin Dosing Weight: 67kg  Vital Signs: Temp: 98.3 F (36.8 C) (12/15 0429) Temp Source: Oral (12/15 0429) BP: 118/82 (12/15 0429) Pulse Rate: 78 (12/15 0429)  Labs: Recent Labs    11/23/18 1946  11/23/18 2246 11/23/18 2359 11/24/18 0357 11/24/18 0803 11/24/18 1118 11/24/18 1836 11/25/18 0434  HGB  --    < > 13.4  --  13.6  --   --   --  12.1  HCT  --   --  40.5  --  41.9  --   --   --  37.5  PLT  --   --  221  --  226  --   --   --  228  APTT  --   --   --  34  --   --   --   --   --   LABPROT  --   --   --  13.2  --   --   --   --   --   INR  --   --   --  1.01  --   --   --   --   --   HEPARINUNFRC  --   --   --   --   --  1.41*  --  0.75* 0.43  CREATININE 1.26*  --   --   --  1.18*  --   --   --   --   TROPONINI 0.39*  --  0.22*  --  0.14*  --  0.09* 0.10*  --    < > = values in this interval not displayed.    Estimated Creatinine Clearance: 45 mL/min (A) (by C-G formula based on SCr of 1.18 mg/dL (H)).   Medical History: Past Medical History:  Diagnosis Date  . Anxiety   . Asthma   . Back pain   . Constipation   . COPD (chronic obstructive pulmonary disease) ([place])   . Depression   . Dyspnea   . Gallbladder problem   . Hyperlipidemia   . Hypertension   . Hypothyroid   . Joint pain   . Leg edema   . Prediabetes     Medications:  No anticoag in PTA meds.  Assessment: Pharmacy consulted for heparin drip dosing and monitoring for PE.   12/14 AM: Heparin 4000 unit bolus and infusion started @ 1150 units/hr 12/14 0900 Heparin HL 1.41. Infusion decreased to 900 units/hr.   Goal of Therapy:  Heparin level 0.3-0.7 units/ml Monitor  platelets by anticoagulation protocol: Yes   Plan:  12/14 @ 1919 HL: 0.75. Level just slightly supratherapeutic. Will decreased infusion to 800 units/hr.  Recheck HL in 8 hours. CBC with AM labs per protocol.   12/15 0430 HL 0.43. Continue current regimen. Recheck heparin level in 6 hours to confirm  Eloise Harman, PharmD, BCPS Clinical Pharmacist 11/25/2018 5:21 AM

## 2018-11-25 NOTE — Progress Notes (Signed)
   11/25/18 1600  Clinical Encounter Type  Visited With Patient and family together  Visit Type Follow-up (AD Education/Completion)  Referral From Nurse  Recommendations Follow-up on Monday.   Chaplain was paged to help patient and family complete AD. Chaplain assisted patient in the completion of the AD, but a notary could not be located to notarize the documentation. Chaplain recommended that the family page the on-call chaplain after 0930 on Monday to finalize the AD.

## 2018-11-26 ENCOUNTER — Ambulatory Visit (INDEPENDENT_AMBULATORY_CARE_PROVIDER_SITE_OTHER): Payer: Self-pay | Admitting: Physician Assistant

## 2018-11-26 ENCOUNTER — Encounter (INDEPENDENT_AMBULATORY_CARE_PROVIDER_SITE_OTHER): Payer: Self-pay

## 2018-11-26 LAB — CBC
HCT: 35.7 % — ABNORMAL LOW (ref 36.0–46.0)
Hemoglobin: 11.5 g/dL — ABNORMAL LOW (ref 12.0–15.0)
MCH: 30.7 pg (ref 26.0–34.0)
MCHC: 32.2 g/dL (ref 30.0–36.0)
MCV: 95.2 fL (ref 80.0–100.0)
Platelets: 213 10*3/uL (ref 150–400)
RBC: 3.75 MIL/uL — ABNORMAL LOW (ref 3.87–5.11)
RDW: 14.3 % (ref 11.5–15.5)
WBC: 9.5 10*3/uL (ref 4.0–10.5)
nRBC: 0 % (ref 0.0–0.2)

## 2018-11-26 LAB — BASIC METABOLIC PANEL
Anion gap: 5 (ref 5–15)
BUN: 27 mg/dL — ABNORMAL HIGH (ref 8–23)
CO2: 27 mmol/L (ref 22–32)
Calcium: 7.9 mg/dL — ABNORMAL LOW (ref 8.9–10.3)
Chloride: 107 mmol/L (ref 98–111)
Creatinine, Ser: 1.15 mg/dL — ABNORMAL HIGH (ref 0.44–1.00)
GFR calc Af Amer: 56 mL/min — ABNORMAL LOW (ref >60)
GFR calc non Af Amer: 48 mL/min — ABNORMAL LOW (ref >60)
Glucose, Bld: 93 mg/dL (ref 70–99)
Potassium: 3.8 mmol/L (ref 3.5–5.1)
Sodium: 139 mmol/L (ref 135–145)

## 2018-11-26 LAB — GLUCOSE, CAPILLARY
Glucose-Capillary: 90 mg/dL (ref 70–99)
Glucose-Capillary: 124 mg/dL — ABNORMAL HIGH (ref 70–99)
Glucose-Capillary: 87 mg/dL (ref 70–99)
Glucose-Capillary: 104 mg/dL — ABNORMAL HIGH (ref 70–99)

## 2018-11-26 LAB — HEPARIN LEVEL (UNFRACTIONATED): Heparin Unfractionated: 0.54 IU/mL (ref 0.30–0.70)

## 2018-11-26 MED ORDER — APIXABAN 5 MG PO TABS: 5.0000 mg | ORAL_TABLET | Freq: Two times a day (BID) | ORAL | Status: DC

## 2018-11-26 MED ORDER — APIXABAN 5 MG PO TABS: ORAL_TABLET | ORAL | 1 refills | Status: DC

## 2018-11-26 MED ORDER — APIXABAN 5 MG PO TABS
10.0000 mg | ORAL_TABLET | Freq: Two times a day (BID) | ORAL | Status: DC
  Administered 2018-11-26: 10 mg via ORAL
  Filled 2018-11-26: qty 2

## 2018-11-26 MED ORDER — GUAIFENESIN-DM 100-10 MG/5ML PO SYRP: 5.0000 mL | ORAL_SOLUTION | ORAL | 0 refills | Status: DC | PRN

## 2018-11-26 NOTE — Progress Notes (Signed)
PICC line removed with no complications. Pt tolerated well. Pt resting in bed comfortably.Will continue to assess.

## 2018-11-26 NOTE — Progress Notes (Signed)
Chaplain secured  Notary and witnesses and executed AD for Pt. Chaplain responded to a Page.    11/26/18 1300  Clinical Encounter Type  Visited With Patient  Visit Type Follow-up  Referral From Nurse  Spiritual Encounters  Spiritual Needs Brochure

## 2018-11-26 NOTE — Care Management Note (Signed)
Case Management Note  Patient Details  Name: Jenny Thomas MRN: [id number] Date of Birth: 1946-05-21  Subjective/Objective:      Patient is from home; lives with her daughter and family.  Admitted for bilateral pulmonary embolism.  Starting on new medication, Eliquis.  30 day free coupon given to patient.  This RNCM called [employer] which is who her prescription coverage is through. Her co pay will be $8.50/90 day supply.  And $8.50 for 30 day supply.  She is independent with all adl's.  Current with PCP.  Does not use any equipment at home.  Does have oxygen at home and uses PRN at night.  No further needs identified at this time by CM team.                  Action/Plan:   Expected Discharge Date:  11/26/18               Expected Discharge Plan:  Home/Self Care  In-House Referral:     Discharge planning Services  CM Consult  Post Acute Care Choice:    Choice offered to:     DME Arranged:    DME Agency:     HH Arranged:    [place] Agency:     Status of Service:  Completed, signed off  If discussed at [employer] of Stay Meetings, dates discussed:    Additional Comments:  Elza Rafter, RN 11/26/2018, 1:58 PM

## 2018-11-26 NOTE — Consult Note (Signed)
ANTICOAGULATION CONSULT NOTE - Initial Consult  Pharmacy Consult for Apixaban Indication: PE  Allergies  Allergen Reactions  . Percocet [Oxycodone-Acetaminophen] Nausea And Vomiting    Patient Measurements: Height: 4\' 10"  (147.3 cm) Weight: 223 lb 1.7 oz (101.2 kg) IBW/kg (Calculated) : 40.9   Vital Signs: Temp: 98.4 F (36.9 C) (12/16 0814) Temp Source: Oral (12/16 0814) BP: 116/55 (12/16 0814) Pulse Rate: 59 (12/16 0814)  Labs: Recent Labs    11/23/18 1946  11/23/18 2359 11/24/18 0357  11/24/18 1118 11/24/18 1836 11/25/18 0434 11/25/18 1137 11/26/18 0546  HGB  --    < >  --  13.6  --   --   --  12.1  --  11.5*  HCT  --    < >  --  41.9  --   --   --  37.5  --  35.7*  PLT  --    < >  --  226  --   --   --  228  --  213  APTT  --   --  34  --   --   --   --   --   --   --   LABPROT  --   --  13.2  --   --   --   --   --   --   --   INR  --   --  1.01  --   --   --   --   --   --   --   HEPARINUNFRC  --   --   --   --    < >  --  0.75* 0.43 0.56 0.54  CREATININE 1.26*  --   --  1.18*  --   --   --   --   --  1.15*  TROPONINI 0.39*   < >  --  0.14*  --  0.09* 0.10*  --   --   --    < > = values in this interval not displayed.    Estimated Creatinine Clearance: 46.7 mL/min (A) (by C-G formula based on SCr of 1.15 mg/dL (H)).   Medical History: Past Medical History:  Diagnosis Date  . Anxiety   . Asthma   . Back pain   . Constipation   . COPD (chronic obstructive pulmonary disease) ([place])   . Depression   . Dyspnea   . Gallbladder problem   . Hyperlipidemia   . Hypertension   . Hypothyroid   . Joint pain   . Leg edema   . Prediabetes     Medications:  Scheduled:  . busPIRone  5 mg Oral BID  . citalopram  40 mg Oral Daily  . insulin aspart  0-9 Units Subcutaneous TID WC  . levothyroxine  88 mcg Oral QAC breakfast  . mometasone-formoterol  2 puff Inhalation BID  . montelukast  10 mg Oral QHS  . simvastatin  20 mg Oral QHS  . sodium chloride flush   10-40 mL Intracatheter Q12H    Assessment: 72 yo female admitted on 12/13 with SOB, discovered to have bilateral PE. Was treated with IV heparin while inpatient - and therapeutic, transitioning to apixaban prior to discharge. Patient was not on any PTA anticoagulants.   Plan:  Initiate apixaban 10 mg BID x 7 days, followed by 5 mg BID thereafter starting on 12/23. Discontinued heparin drip, and discussed with nurse to administer apixaban when heparin stopped. Will counsel  patient on apixaban.   [name], PharmD Pharmacy Resident  11/26/2018 9:19 AM

## 2018-11-26 NOTE — Progress Notes (Signed)
Counseled patient on new apixaban start for pulmonary embolus. Will take apixaban 10 mg PO BID x 7 days, followed by 5 mg BID starting on 12/23. Discussed separating doses by at least 8 hours, not doubling up doses, adverse effects, OTC medications, etc. Patient very knowledgeable and has a good support system with 2 daughters, who are both RNs and help with her health care. Will place care management consult per patient request for cost coverage.  [name], PharmD Pharmacy Resident  11/26/2018 9:50 AM

## 2018-11-26 NOTE — Care Management Important Message (Signed)
Copy of signed Medicare IM left with patient in room. 

## 2018-11-26 NOTE — Progress Notes (Signed)
ANTICOAGULATION CONSULT NOTE - Follow Up Consult  Pharmacy Consult for heparin drip Indication: pulmonary embolus  Allergies  Allergen Reactions  . Percocet [Oxycodone-Acetaminophen] Nausea And Vomiting    Patient Measurements: Height: 4\' 10"  (147.3 cm) Weight: 223 lb 1.7 oz (101.2 kg) IBW/kg (Calculated) : 40.9 Heparin Dosing Weight: 67kg  Vital Signs: Temp: 97.8 F (36.6 C) (12/16 0412) Temp Source: Oral (12/16 0412) BP: 114/63 (12/16 0412) Pulse Rate: 75 (12/16 0412)  Labs: Recent Labs    11/23/18 1946  11/23/18 2359 11/24/18 0357  11/24/18 1118 11/24/18 1836 11/25/18 0434 11/25/18 1137 11/26/18 0546  HGB  --    < >  --  13.6  --   --   --  12.1  --  11.5*  HCT  --    < >  --  41.9  --   --   --  37.5  --  35.7*  PLT  --    < >  --  226  --   --   --  228  --  213  APTT  --   --  34  --   --   --   --   --   --   --   LABPROT  --   --  13.2  --   --   --   --   --   --   --   INR  --   --  1.01  --   --   --   --   --   --   --   HEPARINUNFRC  --   --   --   --    < >  --  0.75* 0.43 0.56 0.54  CREATININE 1.26*  --   --  1.18*  --   --   --   --   --  1.15*  TROPONINI 0.39*   < >  --  0.14*  --  0.09* 0.10*  --   --   --    < > = values in this interval not displayed.    Estimated Creatinine Clearance: 46.7 mL/min (A) (by C-G formula based on SCr of 1.15 mg/dL (H)).   Medical History: Past Medical History:  Diagnosis Date  . Anxiety   . Asthma   . Back pain   . Constipation   . COPD (chronic obstructive pulmonary disease) ([place])   . Depression   . Dyspnea   . Gallbladder problem   . Hyperlipidemia   . Hypertension   . Hypothyroid   . Joint pain   . Leg edema   . Prediabetes     Medications:  No anticoag in PTA meds.  Assessment: Pharmacy consulted for heparin drip dosing and monitoring for PE.   Goal of Therapy:  Heparin level 0.3-0.7 units/ml Monitor platelets by anticoagulation protocol: Yes   Plan:  12/15 0430 HL 0.43. Continue  current regimen. Recheck heparin level in 6 hours to confirm   12/15 11:37  HL = 0.56.  Continue current drip rate. Recheck HL/CBC with am labs.   12/16 AM heparin level 0.54. Continue current regimen. Recheck heparin level and CBC with tomorrow AM labs.  Eloise Harman [facility] Clinical Pharmacist 11/26/2018 7:03 AM

## 2018-11-26 NOTE — Progress Notes (Signed)
Jenny Thomas to be D/C'd Home per MD order.  Discussed prescriptions and follow up appointments with the patient. Prescriptions given to patient, medication list explained in detail. Pt verbalized understanding.  Allergies as of 11/26/2018      Reactions   Percocet [oxycodone-acetaminophen] Nausea And Vomiting      Medication List    STOP taking these medications   losartan 50 MG tablet Commonly known as:  COZAAR   polyethylene glycol powder powder Commonly known as:  GLYCOLAX/MIRALAX     TAKE these medications   ACCU-CHEK FASTCLIX LANCETS Misc 1 each by Does not apply route 2 (two) times daily.   ACCU-CHEK NANO SMARTVIEW w/Device Kit 1 each by Does not apply route daily. Check blood sugar twice a day.   albuterol 108 (90 Base) MCG/ACT inhaler Commonly known as:  PROVENTIL HFA;VENTOLIN HFA Inhale into the lungs every 6 (six) hours as needed for wheezing or shortness of breath.   albuterol (2.5 MG/3ML) 0.083% nebulizer solution Commonly known as:  PROVENTIL Take 2.5 mg by nebulization every 6 (six) hours as needed for wheezing or shortness of breath.   alendronate 70 MG tablet Commonly known as:  FOSAMAX Take 70 mg by mouth once a week. Take with a full glass of water on an empty stomach.   apixaban 5 MG Tabs tablet Commonly known as:  ELIQUIS 10 mg po bid for 7 days, then 5 mg po bid   busPIRone 5 MG tablet Commonly known as:  BUSPAR Take 5 mg by mouth 2 (two) times daily.   cetirizine 10 MG tablet Commonly known as:  ZYRTEC Take 10 mg by mouth daily.   citalopram 40 MG tablet Commonly known as:  CELEXA Take 40 mg by mouth daily.   Fluticasone-Salmeterol 250-50 MCG/DOSE Aepb Commonly known as:  ADVAIR Inhale 1 puff into the lungs 2 (two) times daily.   guaiFENesin-dextromethorphan 100-10 MG/5ML syrup Commonly known as:  ROBITUSSIN DM Take 5 mLs by mouth every 4 (four) hours as needed for cough.   levothyroxine 88 MCG tablet Commonly known as:   SYNTHROID, LEVOTHROID Take 88 mcg by mouth daily before breakfast.   montelukast 10 MG tablet Commonly known as:  SINGULAIR Take 10 mg by mouth at bedtime.   multivitamin with minerals Tabs tablet Take 1 tablet by mouth daily.   pseudoephedrine-guaifenesin 60-600 MG 12 hr tablet Commonly known as:  MUCINEX D Take 1 tablet by mouth every 12 (twelve) hours as needed for congestion.   simvastatin 20 MG tablet Commonly known as:  ZOCOR Take 20 mg by mouth at bedtime.   Vitamin D (Ergocalciferol) 1.25 MG (50000 UT) Caps capsule Commonly known as:  DRISDOL Take 1 capsule (50,000 Units total) by mouth every 7 (seven) days.       Vitals:   11/26/18 0412 11/26/18 0814  BP: 114/63 (!) 116/55  Pulse: 75 (!) 59  Resp: 18 18  Temp: 97.8 F (36.6 C) 98.4 F (36.9 C)  SpO2: 100% 99%    Tele box removed and returned. Skin clean, dry and intact without evidence of skin break down, no evidence of skin tears noted. IV catheter discontinued intact. Site without signs and symptoms of complications. Dressing and pressure applied. Pt denies pain at this time. No complaints noted.  An After Visit Summary was printed and given to the patient. Patient escorted via [place], and D/C home via private auto.  [name]

## 2018-11-26 NOTE — Discharge Instructions (Signed)
Fall precaution, watch for bleeding.

## 2018-11-26 NOTE — Discharge Summary (Signed)
[facility] [place] NAME: Jenny Thomas    MR#:  [id number]  DATE OF BIRTH:  July 16, 1946  DATE OF ADMISSION:  11/23/2018   ADMITTING PHYSICIAN: Lance Coon, MD  DATE OF DISCHARGE: No discharge date for patient encounter.  PRIMARY CARE PHYSICIAN: [name], MD   ADMISSION DIAGNOSIS:  Cardiac ischemia [I25.9] COPD with acute exacerbation ([place]) [J44.1] Bilateral pulmonary embolism (HCC) [I26.99] DISCHARGE DIAGNOSIS:  Principal Problem:   Bilateral pulmonary embolism (HCC) Active Problems:   Type 2 diabetes mellitus without complication, without long-term current use of insulin (HCC)   Essential hypertension   COPD (chronic obstructive pulmonary disease) (HCC)   HLD (hyperlipidemia)   Hypothyroidism  SECONDARY DIAGNOSIS:   Past Medical History:  Diagnosis Date  . Anxiety   . Asthma   . Back pain   . Constipation   . COPD (chronic obstructive pulmonary disease) ([place])   . Depression   . Dyspnea   . Gallbladder problem   . Hyperlipidemia   . Hypertension   . Hypothyroid   . Joint pain   . Leg edema   . Prediabetes    HOSPITAL COURSE:  Acute respiratory failure with hypoxia due toBilateral pulmonary embolism (HCC)  Weaned off oxygen. She has been treated with IV heparin, changed to Eliquis. Venous duplex of bilateral lower extremity did not show any DVT. Follow-up hematologist as outpatient.  Elevated troponin.  Due to demanding ischemia.  Continue heparin drip.  Echocardiogram reported LV EF: 55% - 60%.  Type 2 diabetes mellitus without complication, without long-term current use of insulin (HCC) -sliding scale insulin coverage  Essential hypertension, hold losartan due to normal blood pressure.  Follow-up PCP to resume.  COPD (chronic obstructive pulmonary disease) (HCC)-continue home meds and nebulizer PRN.  HLD (hyperlipidemia) -Home dose antilipid Hypothyroidism -home dose thyroid  replacement Morbid obesity.  Exercise and diet control, follow-up PCP. DISCHARGE CONDITIONS:  Stable, discharge to home today. CONSULTS OBTAINED:   DRUG ALLERGIES:   Allergies  Allergen Reactions  . Percocet [Oxycodone-Acetaminophen] Nausea And Vomiting   DISCHARGE MEDICATIONS:   Allergies as of 11/26/2018      Reactions   Percocet [oxycodone-acetaminophen] Nausea And Vomiting      Medication List    STOP taking these medications   losartan 50 MG tablet Commonly known as:  COZAAR   polyethylene glycol powder powder Commonly known as:  GLYCOLAX/MIRALAX     TAKE these medications   ACCU-CHEK FASTCLIX LANCETS Misc 1 each by Does not apply route 2 (two) times daily.   ACCU-CHEK NANO SMARTVIEW w/Device Kit 1 each by Does not apply route daily. Check blood sugar twice a day.   albuterol 108 (90 Base) MCG/ACT inhaler Commonly known as:  PROVENTIL HFA;VENTOLIN HFA Inhale into the lungs every 6 (six) hours as needed for wheezing or shortness of breath.   albuterol (2.5 MG/3ML) 0.083% nebulizer solution Commonly known as:  PROVENTIL Take 2.5 mg by nebulization every 6 (six) hours as needed for wheezing or shortness of breath.   alendronate 70 MG tablet Commonly known as:  FOSAMAX Take 70 mg by mouth once a week. Take with a full glass of water on an empty stomach.   apixaban 5 MG Tabs tablet Commonly known as:  ELIQUIS 10 mg po bid for 7 days, then 5 mg po bid   busPIRone 5 MG tablet Commonly known as:  BUSPAR Take 5 mg by mouth 2 (two) times daily.   cetirizine 10 MG  tablet Commonly known as:  ZYRTEC Take 10 mg by mouth daily.   citalopram 40 MG tablet Commonly known as:  CELEXA Take 40 mg by mouth daily.   Fluticasone-Salmeterol 250-50 MCG/DOSE Aepb Commonly known as:  ADVAIR Inhale 1 puff into the lungs 2 (two) times daily.   guaiFENesin-dextromethorphan 100-10 MG/5ML syrup Commonly known as:  ROBITUSSIN DM Take 5 mLs by mouth every 4 (four) hours as  needed for cough.   levothyroxine 88 MCG tablet Commonly known as:  SYNTHROID, LEVOTHROID Take 88 mcg by mouth daily before breakfast.   montelukast 10 MG tablet Commonly known as:  SINGULAIR Take 10 mg by mouth at bedtime.   multivitamin with minerals Tabs tablet Take 1 tablet by mouth daily.   pseudoephedrine-guaifenesin 60-600 MG 12 hr tablet Commonly known as:  MUCINEX D Take 1 tablet by mouth every 12 (twelve) hours as needed for congestion.   simvastatin 20 MG tablet Commonly known as:  ZOCOR Take 20 mg by mouth at bedtime.   Vitamin D (Ergocalciferol) 1.25 MG (50000 UT) Caps capsule Commonly known as:  DRISDOL Take 1 capsule (50,000 Units total) by mouth every 7 (seven) days.        DISCHARGE INSTRUCTIONS:  AVS.  If you experience worsening of your admission symptoms, develop shortness of breath, life threatening emergency, suicidal or homicidal thoughts you must seek medical attention immediately by calling 911 or calling your MD immediately  if symptoms less severe.  You Must read complete instructions/literature along with all the possible adverse reactions/side effects for all the Medicines you take and that have been prescribed to you. Take any new Medicines after you have completely understood and accpet all the possible adverse reactions/side effects.   Please note  You were cared for by a hospitalist during your hospital stay. If you have any questions about your discharge medications or the care you received while you were in the hospital after you are discharged, you can call the unit and asked to speak with the hospitalist on call if the hospitalist that took care of you is not available. Once you are discharged, your primary care physician will handle any further medical issues. Please note that NO REFILLS for any discharge medications will be authorized once you are discharged, as it is imperative that you return to your primary care physician (or establish a  relationship with a primary care physician if you do not have one) for your aftercare needs so that they can reassess your need for medications and monitor your lab values.    On the day of Discharge:  VITAL SIGNS:  Blood pressure (!) 116/55, pulse (!) 59, temperature 98.4 F (36.9 C), temperature source Oral, resp. rate 18, height 4' 10"  (1.473 m), weight 101.2 kg, SpO2 99 %. PHYSICAL EXAMINATION:  GENERAL:  72 y.o.-year-old patient lying in the bed with no acute distress.  Morbid obesity. EYES: Pupils equal, round, reactive to light and accommodation. No scleral icterus. Extraocular muscles intact.  HEENT: Head atraumatic, normocephalic. Oropharynx and nasopharynx clear.  NECK:  Supple, no jugular venous distention. No thyroid enlargement, no tenderness.  LUNGS: Normal breath sounds bilaterally, no wheezing, rales,rhonchi or crepitation. No use of accessory muscles of respiration.  CARDIOVASCULAR: S1, S2 normal. No murmurs, rubs, or gallops.  ABDOMEN: Soft, non-tender, non-distended. Bowel sounds present. No organomegaly or mass.  EXTREMITIES: No pedal edema, cyanosis, or clubbing.  NEUROLOGIC: Cranial nerves II through XII are intact. Muscle strength 5/5 in all extremities. Sensation intact. Gait not checked.  PSYCHIATRIC: The patient is alert and oriented x 3.  SKIN: No obvious rash, lesion, or ulcer.  DATA REVIEW:   CBC Recent Labs  Lab 11/26/18 0546  WBC 9.5  HGB 11.5*  HCT 35.7*  PLT 213    Chemistries  Recent Labs  Lab 11/26/18 0546  NA 139  K 3.8  CL 107  CO2 27  GLUCOSE 93  BUN 27*  CREATININE 1.15*  CALCIUM 7.9*     Microbiology Results  No results found for this or any previous visit.  RADIOLOGY:  No results found.   Management plans discussed with the patient, family and they are in agreement.  CODE STATUS: Full Code   TOTAL TIME TAKING CARE OF THIS PATIENT: 33 minutes.    Demetrios Loll M.D on 11/26/2018 at 1:20 PM  Between 7am to 6pm - Pager -  [phone]  After 6pm go to [email or web address] - Proofreader  [facility]  Office  [phone]  CC: Primary care physician; [name], MD   Note: This dictation was prepared with Dragon dictation along with smaller phrase technology. Any transcriptional errors that result from this process are unintentional.

## 2018-11-26 NOTE — Progress Notes (Signed)
SATURATION QUALIFICATIONS: (This note is used to comply with regulatory documentation for home oxygen)  Patient Saturations on Room Air at Rest = 98%  Patient Saturations on Room Air while Ambulating = 95%  Patient Saturations on n/a Liters of oxygen while Ambulating = n/a%  Please briefly explain why patient needs home oxygen: Pt ambulated around nurses station and tolerated well. No distress noted.

## 2018-11-29 DIAGNOSIS — I2699 Other pulmonary embolism without acute cor pulmonale: Secondary | ICD-10-CM | POA: Diagnosis not present

## 2018-11-29 DIAGNOSIS — J209 Acute bronchitis, unspecified: Secondary | ICD-10-CM | POA: Diagnosis not present

## 2018-11-30 DIAGNOSIS — R0602 Shortness of breath: Secondary | ICD-10-CM | POA: Diagnosis not present

## 2018-12-10 ENCOUNTER — Other Ambulatory Visit: Payer: Self-pay

## 2018-12-10 ENCOUNTER — Inpatient Hospital Stay: Payer: Medicare HMO | Attending: Oncology | Admitting: Oncology

## 2018-12-10 ENCOUNTER — Encounter: Payer: Self-pay | Admitting: Oncology

## 2018-12-10 ENCOUNTER — Inpatient Hospital Stay: Payer: Medicare HMO

## 2018-12-10 VITALS — BP 124/65 | HR 130 | Temp 97.8°F | Resp 18 | Ht 59.0 in | Wt 222.6 lb

## 2018-12-10 DIAGNOSIS — Z7901 Long term (current) use of anticoagulants: Secondary | ICD-10-CM | POA: Diagnosis not present

## 2018-12-10 DIAGNOSIS — I2699 Other pulmonary embolism without acute cor pulmonale: Secondary | ICD-10-CM

## 2018-12-10 DIAGNOSIS — D509 Iron deficiency anemia, unspecified: Secondary | ICD-10-CM | POA: Diagnosis not present

## 2018-12-10 DIAGNOSIS — J449 Chronic obstructive pulmonary disease, unspecified: Secondary | ICD-10-CM | POA: Diagnosis not present

## 2018-12-10 LAB — CBC WITH DIFFERENTIAL/PLATELET
Abs Immature Granulocytes: 0.04 10*3/uL (ref 0.00–0.07)
Basophils Absolute: 0 10*3/uL (ref 0.0–0.1)
Basophils Relative: 1 %
Eosinophils Absolute: 0.1 10*3/uL (ref 0.0–0.5)
Eosinophils Relative: 3 %
HCT: 38.9 % (ref 36.0–46.0)
Hemoglobin: 12.7 g/dL (ref 12.0–15.0)
Immature Granulocytes: 1 %
LYMPHS PCT: 30 %
Lymphs Abs: 1.6 10*3/uL (ref 0.7–4.0)
MCH: 30.1 pg (ref 26.0–34.0)
MCHC: 32.6 g/dL (ref 30.0–36.0)
MCV: 92.2 fL (ref 80.0–100.0)
Monocytes Absolute: 0.5 10*3/uL (ref 0.1–1.0)
Monocytes Relative: 10 %
Neutro Abs: 2.9 10*3/uL (ref 1.7–7.7)
Neutrophils Relative %: 55 %
Platelets: 244 10*3/uL (ref 150–400)
RBC: 4.22 MIL/uL (ref 3.87–5.11)
RDW: 13.4 % (ref 11.5–15.5)
WBC: 5.2 10*3/uL (ref 4.0–10.5)
nRBC: 0 % (ref 0.0–0.2)

## 2018-12-10 LAB — COMPREHENSIVE METABOLIC PANEL
ALT: 15 U/L (ref 0–44)
ANION GAP: 8 (ref 5–15)
AST: 23 U/L (ref 15–41)
Albumin: 4.1 g/dL (ref 3.5–5.0)
Alkaline Phosphatase: 80 U/L (ref 38–126)
BUN: 18 mg/dL (ref 8–23)
CO2: 25 mmol/L (ref 22–32)
Calcium: 8.7 mg/dL — ABNORMAL LOW (ref 8.9–10.3)
Chloride: 107 mmol/L (ref 98–111)
Creatinine, Ser: 1.1 mg/dL — ABNORMAL HIGH (ref 0.44–1.00)
GFR calc Af Amer: 59 mL/min — ABNORMAL LOW (ref >60)
GFR calc non Af Amer: 51 mL/min — ABNORMAL LOW (ref >60)
GLUCOSE: 99 mg/dL (ref 70–99)
Potassium: 3.8 mmol/L (ref 3.5–5.1)
Sodium: 140 mmol/L (ref 135–145)
Total Bilirubin: 0.9 mg/dL (ref 0.3–1.2)
Total Protein: 7.2 g/dL (ref 6.5–8.1)

## 2018-12-10 LAB — IRON AND TIBC
Iron: 65 ug/dL (ref 28–170)
Saturation Ratios: 15 % (ref 10.4–31.8)
TIBC: 422 ug/dL (ref 250–450)
UIBC: 357 ug/dL

## 2018-12-10 LAB — FERRITIN: Ferritin: 16 ng/mL (ref 11–307)

## 2018-12-10 NOTE — Progress Notes (Signed)
Patient here today as a new patient  

## 2018-12-11 MED ORDER — FERROUS SULFATE 325 (65 FE) MG PO TBEC: 325.0000 mg | DELAYED_RELEASE_TABLET | Freq: Every day | ORAL | 3 refills | Status: AC

## 2018-12-11 NOTE — Progress Notes (Signed)
Hematology/Oncology Consult note [facility] Telephone:([phone] [phone]   Patient Care Team: [name], MD as PCP - General (Family Medicine)  REFERRING PROVIDER: Dr.Chen CHIEF COMPLAINTS/REASON FOR VISIT:  Evaluation of PE  HISTORY OF PRESENTING ILLNESS:  Jenny Thomas is a  72 y.o.  female with PMH listed below who was referred to me for evaluation of PE management.  Present to emergency room on 11/23/2018 for evaluation progressive shortness of breath.   CT chest angiogram showed bilateral pulmonary emboli, right greater than left, involving segmental branches of all lobes of the right lung and the left lower lobe.  CT findings equivocal for right heart strain with strengthening after interval ventricular septum and RV/LV ratio of approximately 1. 2D echo showed left ventricular function 55 to 60%, wall motion was normal.  No regional wall motion abnormalities.  Moderate regurgitation.  Right ventricle mildly dilated.  Double thickness was normal.  Systolic function was low normal.  Right atrium was mildly dilated.  Tricuspid valve moderate regurgitation.  Pulmonary arteries systolic pressure was moderately elevated.  PA peak pressure 46 mm Hg No lower extremity DVT was found. Patient was started on IV heparin. Switch to Eliquis at discharge. Denies any proceeding immobilizing factors.   Reports feeling well.  Currently takes Eliquis 5 mg plan.  Tolerates well.Denies hematochezia, hematuria, hematemesis, epistaxis, black tarry stool or easy bruising. ' Reports breathing has improved significantly.  Review of Systems  Constitutional: Negative for appetite change, chills, fatigue and fever.  HENT:   Negative for hearing loss and voice change.   Eyes: Negative for eye problems.  Respiratory: Negative for chest tightness and cough.   Cardiovascular: Negative for chest pain.  Gastrointestinal: Negative for abdominal distention,  abdominal pain and blood in stool.  Endocrine: Negative for hot flashes.  Genitourinary: Negative for difficulty urinating and frequency.   Musculoskeletal: Negative for arthralgias.  Skin: Negative for itching and rash.  Neurological: Negative for extremity weakness.  Hematological: Negative for adenopathy.  Psychiatric/Behavioral: Negative for confusion.    MEDICAL HISTORY:  Past Medical History:  Diagnosis Date  . Anxiety   . Asthma   . Back pain   . Constipation   . COPD (chronic obstructive pulmonary disease) ([place])   . Depression   . Dyspnea   . Gallbladder problem   . Hyperlipidemia   . Hypertension   . Hypothyroid   . Joint pain   . Leg edema   . Prediabetes     SURGICAL HISTORY: Past Surgical History:  Procedure Laterality Date  . ABDOMINAL HYSTERECTOMY    . CHOLECYSTECTOMY    . hernia repair    . LAPAROSCOPIC GASTRIC BANDING      SOCIAL HISTORY: Social History   Socioeconomic History  . Marital status: Single    Spouse name: Not on file  . Number of children: Not on file  . Years of education: Not on file  . Highest education level: Not on file  Occupational History  . Occupation: Retired  Scientific laboratory technician  . Financial resource strain: Not on file  . Food insecurity:    Worry: Not on file    Inability: Not on file  . Transportation needs:    Medical: Not on file    Non-medical: Not on file  Tobacco Use  . Smoking status: Never Smoker  . Smokeless tobacco: Never Used  Substance and Sexual Activity  . Alcohol use: Not on file  . Drug use: Not on file  . Sexual  activity: Not on file  Lifestyle  . Physical activity:    Days per week: Not on file    Minutes per session: Not on file  . Stress: Not on file  Relationships  . Social connections:    Talks on phone: Not on file    Gets together: Not on file    Attends religious service: Not on file    Active member of club or organization: Not on file    Attends meetings of clubs or organizations:  Not on file    Relationship status: Not on file  . Intimate partner violence:    Fear of current or ex partner: Not on file    Emotionally abused: Not on file    Physically abused: Not on file    Forced sexual activity: Not on file  Other Topics Concern  . Not on file  Social History Narrative  . Not on file    FAMILY HISTORY: Family History  Problem Relation Age of Onset  . Breast cancer Maternal Aunt   . Hypertension Mother   . Kidney disease Mother   . Obesity Mother   . Stroke Father   . Heart disease Father   . Alcoholism Father     ALLERGIES:  is allergic to bee pollen and percocet [oxycodone-acetaminophen].  MEDICATIONS:  Current Outpatient Medications  Medication Sig Dispense Refill  . ACCU-CHEK FASTCLIX LANCETS MISC 1 each by Does not apply route 2 (two) times daily. 100 each 0  . albuterol (PROVENTIL HFA;VENTOLIN HFA) 108 (90 Base) MCG/ACT inhaler Inhale into the lungs every 6 (six) hours as needed for wheezing or shortness of breath.    [name] albuterol (PROVENTIL) (2.5 MG/3ML) 0.083% nebulizer solution Take 2.5 mg by nebulization every 6 (six) hours as needed for wheezing or shortness of breath.    [name] alendronate (FOSAMAX) 70 MG tablet Take 70 mg by mouth once a week. Take with a full glass of water on an empty stomach.    [name] apixaban (ELIQUIS) 5 MG TABS tablet 10 mg po bid for 7 days, then 5 mg po bid 60 tablet 1  . Blood Glucose Monitoring Suppl (ACCU-CHEK NANO SMARTVIEW) w/Device KIT 1 each by Does not apply route daily. Check blood sugar twice a day. 1 kit 0  . busPIRone (BUSPAR) 5 MG tablet Take 5 mg by mouth 2 (two) times daily.     . cetirizine (ZYRTEC) 10 MG tablet Take 10 mg by mouth daily.    . citalopram (CELEXA) 40 MG tablet Take 40 mg by mouth daily.    . Fluticasone-Salmeterol (ADVAIR) 250-50 MCG/DOSE AEPB Inhale 1 puff into the lungs 2 (two) times daily.    [name] levothyroxine (SYNTHROID, LEVOTHROID) 88 MCG tablet Take 88 mcg by mouth daily before breakfast.      . montelukast (SINGULAIR) 10 MG tablet Take 10 mg by mouth at bedtime.    . simvastatin (ZOCOR) 20 MG tablet Take 20 mg by mouth at bedtime.     . Vitamin D, Ergocalciferol, (DRISDOL) 1.25 MG (50000 UT) CAPS capsule Take 1 capsule (50,000 Units total) by mouth every 7 (seven) days. 4 capsule 0   No current facility-administered medications for this visit.      PHYSICAL EXAMINATION: ECOG PERFORMANCE STATUS: 1 - Symptomatic but completely ambulatory Vitals:   12/10/18 1437  BP: 124/65  Pulse: (!) 130  Resp: 18  Temp: 97.8 F (36.6 C)  SpO2: 94%   Filed Weights   12/10/18 1437  Weight: 222 lb  9 oz (101 kg)    Physical Exam Constitutional:      General: She is not in acute distress. HENT:     Head: Normocephalic and atraumatic.  Eyes:     General: No scleral icterus.    Pupils: Pupils are equal, round, and reactive to light.  Neck:     Musculoskeletal: Normal range of motion and neck supple.  Cardiovascular:     Rate and Rhythm: Normal rate and regular rhythm.     Heart sounds: Normal heart sounds.  Pulmonary:     Effort: Pulmonary effort is normal. No respiratory distress.     Breath sounds: No wheezing.  Abdominal:     General: Bowel sounds are normal. There is no distension.     Palpations: Abdomen is soft. There is no mass.     Tenderness: There is no abdominal tenderness.  Musculoskeletal: Normal range of motion.        General: No deformity.  Skin:    General: Skin is warm and dry.     Findings: No erythema or rash.  Neurological:     Mental Status: She is alert and oriented to person, place, and time.     Cranial Nerves: No cranial nerve deficit.     Coordination: Coordination normal.  Psychiatric:        Behavior: Behavior normal.        Thought Content: Thought content normal.      LABORATORY DATA:  I have reviewed the data as listed Lab Results  Component Value Date   WBC 5.2 12/10/2018   HGB 12.7 12/10/2018   HCT 38.9 12/10/2018   MCV 92.2  12/10/2018   PLT 244 12/10/2018   Recent Labs    02/01/18  11/24/18 0357 11/26/18 0546 12/10/18 1543  NA 140   < > 138 139 140  K 4.8   < > 3.8 3.8 3.8  CL  --    < > 104 107 107  CO2  --    < > _0 GLUCOSE  --    < > 212* 93 99  BUN 20   < > 23 27* 18  CREATININE 1.0   < > 1.18* 1.15* 1.10*  CALCIUM  --    < > 8.4* 7.9* 8.7*  GFRNONAA  --    < > 47* 48* 51*  GFRAA  --    < > 54* 56* 59*  PROT  --   --   --   --  7.2  ALBUMIN  --   --   --   --  4.1  AST 23  --   --   --  23  ALT 11  --   --   --  15  ALKPHOS  --   --   --   --  80  BILITOT  --   --   --   --  0.9   < > = values in this interval not displayed.   Iron/TIBC/Ferritin/ %Sat    Component Value Date/Time   IRON 65 12/10/2018 1543   TIBC 422 12/10/2018 1543   FERRITIN 16 12/10/2018 1543   IRONPCTSAT 15 12/10/2018 1543        ASSESSMENT & PLAN:  1. Acute pulmonary embolism without acute cor pulmonale, unspecified pulmonary embolism type ([place])   2. Iron deficiency anemia, unspecified iron deficiency anemia type    Unprovoked PE, recommend long term anticoagulation.  Tolerates anticoagulation well.  Continue Eliquis  19m BID for 6 months. After that, may switch to 2.539mBID for maintenance Check factor V leiden mutation and prothrombin mutation.   .Anemia, check iron panel.  Labs reviewed. Consistent with boarder line iron deficiency.  Will start patient on oral iron supplements. Will discuss about GI work up at next visit.   Orders Placed This Encounter  Procedures  . CBC with Differential/Platelet    Standing Status:   Future    Number of Occurrences:   1    Standing Expiration Date:   12/10/2019  . Ferritin    Standing Status:   Future    Number of Occurrences:   1    Standing Expiration Date:   12/11/2019  . Iron and TIBC    Standing Status:   Future    Number of Occurrences:   1    Standing Expiration Date:   12/11/2019  . Prothrombin gene mutation    Standing Status:   Future     Number of Occurrences:   1    Standing Expiration Date:   12/11/2019  . Factor 5 leiden    Standing Status:   Future    Number of Occurrences:   1    Standing Expiration Date:   12/11/2019  . CBC with Differential/Platelet    Standing Status:   Future    Standing Expiration Date:   12/10/2019  . Comprehensive metabolic panel    Standing Status:   Future    Number of Occurrences:   1    Standing Expiration Date:   12/10/2019    All questions were answered. The patient knows to call the clinic with any problems questions or concerns.  Return of visit: 3 months.  Thank you for this kind referral and the opportunity to participate in the care of this patient. A copy of today's note is routed to referring provider  Total face to face encounter time for this patient visit was 45 min. >50% of the time was  spent in counseling and coordination of care.    ZhEarlie ServerMD, PhD Hematology Oncology [facility] Hospital[facility]- [phone]/31/2019

## 2018-12-13 ENCOUNTER — Encounter: Payer: Self-pay | Admitting: *Deleted

## 2018-12-14 LAB — PROTHROMBIN GENE MUTATION

## 2018-12-17 LAB — FACTOR 5 LEIDEN

## 2018-12-19 DIAGNOSIS — F411 Generalized anxiety disorder: Secondary | ICD-10-CM | POA: Diagnosis not present

## 2018-12-19 DIAGNOSIS — I2699 Other pulmonary embolism without acute cor pulmonale: Secondary | ICD-10-CM | POA: Diagnosis not present

## 2018-12-19 DIAGNOSIS — Z1211 Encounter for screening for malignant neoplasm of colon: Secondary | ICD-10-CM | POA: Diagnosis not present

## 2018-12-19 DIAGNOSIS — Z124 Encounter for screening for malignant neoplasm of cervix: Secondary | ICD-10-CM | POA: Diagnosis not present

## 2018-12-19 DIAGNOSIS — E7800 Pure hypercholesterolemia, unspecified: Secondary | ICD-10-CM | POA: Diagnosis not present

## 2018-12-19 DIAGNOSIS — E039 Hypothyroidism, unspecified: Secondary | ICD-10-CM | POA: Diagnosis not present

## 2018-12-27 ENCOUNTER — Ambulatory Visit (INDEPENDENT_AMBULATORY_CARE_PROVIDER_SITE_OTHER): Payer: Medicare HMO | Admitting: Physician Assistant

## 2018-12-27 VITALS — BP 108/68 | HR 85 | Temp 98.0°F | Ht 59.0 in | Wt 218.0 lb

## 2018-12-27 DIAGNOSIS — Z6841 Body Mass Index (BMI) 40.0 and over, adult: Secondary | ICD-10-CM

## 2018-12-27 DIAGNOSIS — E038 Other specified hypothyroidism: Secondary | ICD-10-CM | POA: Diagnosis not present

## 2018-12-27 DIAGNOSIS — E559 Vitamin D deficiency, unspecified: Secondary | ICD-10-CM | POA: Diagnosis not present

## 2018-12-27 MED ORDER — VITAMIN D (ERGOCALCIFEROL) 1.25 MG (50000 UNIT) PO CAPS: 50000.0000 [IU] | ORAL_CAPSULE | ORAL | 0 refills | Status: DC

## 2018-12-27 NOTE — Progress Notes (Signed)
Office: [phone]  /  Fax: [phone]   HPI:   Chief Complaint: OBESITY Jenny Thomas is here to discuss her progress with her obesity treatment plan. She is on the  portion control better and make smarter food choices, such as increase vegetables and decrease simple carbohydrates  and is following her eating plan approximately 0 % of the time. She states she is walking the dog 7 times per week. Jenny Thomas reports that she has struggled with the plan due to being in hospital with pulmonary embolisms. She is now on Eliquis. She reports not getting enough protein. Her weight is 218 lb (98.9 kg) today and has gained 3 lbs since her last visit. She has lost 0 lbs since starting treatment with [name].  Vitamin D deficiency Jenny Thomas has a diagnosis of vitamin D deficiency. She is currently taking prescription Vit D and denies nausea, vomiting or muscle weakness.   Hypothyroid Jenny Thomas has a diagnosis of hypothyroidism. She is currently taking levothyroxine She denies hot or cold intolerances.    ASSESSMENT AND PLAN:  Vitamin D deficiency - Plan: Vitamin D, Ergocalciferol, (DRISDOL) 1.25 MG (50000 UT) CAPS capsule  Other specified hypothyroidism  Class 3 severe obesity with serious comorbidity and body mass index (BMI) of 40.0 to 44.9 in adult, unspecified obesity type (Jenny Thomas)   PLAN:  Vitamin D Deficiency Jenny Thomas was informed that low vitamin D levels contributes to fatigue and are associated with obesity, breast, and colon cancer. She agrees to continue to take prescription Vit D @50 ,000 IU every week #4 with no refills and will follow up for routine testing of vitamin D, at least 2-3 times per year. She was informed of the risk of over-replacement of vitamin D and agrees to not increase her dose unless she discusses this with [name] first. Jenny Thomas agrees to follow up with our clinic in 3 weeks. Hypothyroid Jenny Thomas was informed of the importance of good thyroid control to help with weight loss  efforts. She was also informed that supratherapeutic thyroid levels are dangerous and will not improve weight loss results. Jenny Thomas agrees to continue taking levothyroxine and follow up with our clinic in 3 weeks. Obesity Jenny Thomas is currently in the action stage of change. As such, her goal is to continue with weight loss efforts She has agreed to portion control better and make smarter food choices, such as increase vegetables and decrease simple carbohydrates  Jenny Thomas has been instructed to work up to a goal of 150 minutes of combined cardio and strengthening exercise per week for weight loss and overall health benefits. We discussed the following Behavioral Modification Strategies today: increasing lean protein intake and work on meal planning and easy cooking plans   Jenny Thomas has agreed to follow up with our clinic in 3 weeks. She was informed of the importance of frequent follow up visits to maximize her success with intensive lifestyle modifications for her multiple health conditions.  ALLERGIES: Allergies  Allergen Reactions  . Bee Pollen Anaphylaxis    Bee stings & bee pollen- anaphylaxis  . Percocet [Oxycodone-Acetaminophen] Nausea And Vomiting    MEDICATIONS: Current Outpatient Medications on File Prior to Visit  Medication Sig Dispense Refill  . ACCU-CHEK FASTCLIX LANCETS MISC 1 each by Does not apply route 2 (two) times daily. 100 each 0  . albuterol (PROVENTIL HFA;VENTOLIN HFA) 108 (90 Base) MCG/ACT inhaler Inhale into the lungs every 6 (six) hours as needed for wheezing or shortness of breath.    [name] albuterol (PROVENTIL) (2.5 MG/3ML) 0.083% nebulizer solution  Take 2.5 mg by nebulization every 6 (six) hours as needed for wheezing or shortness of breath.    [name] alendronate (FOSAMAX) 70 MG tablet Take 70 mg by mouth once a week. Take with a full glass of water on an empty stomach.    [name] apixaban (ELIQUIS) 5 MG TABS tablet 10 mg po bid for 7 days, then 5 mg po bid 60 tablet 1  . Blood  Glucose Monitoring Suppl (ACCU-CHEK NANO SMARTVIEW) w/Device KIT 1 each by Does not apply route daily. Check blood sugar twice a day. 1 kit 0  . busPIRone (BUSPAR) 5 MG tablet Take 5 mg by mouth 2 (two) times daily.     . cetirizine (ZYRTEC) 10 MG tablet Take 10 mg by mouth daily.    . citalopram (CELEXA) 40 MG tablet Take 40 mg by mouth daily.    . ferrous sulfate 325 (65 FE) MG EC tablet Take 1 tablet (325 mg total) by mouth daily. 30 tablet 3  . Fluticasone-Salmeterol (ADVAIR) 250-50 MCG/DOSE AEPB Inhale 1 puff into the lungs 2 (two) times daily.    [name] levothyroxine (SYNTHROID, LEVOTHROID) 88 MCG tablet Take 88 mcg by mouth daily before breakfast.    . montelukast (SINGULAIR) 10 MG tablet Take 10 mg by mouth at bedtime.    . simvastatin (ZOCOR) 20 MG tablet Take 20 mg by mouth at bedtime.     . Vitamin D, Ergocalciferol, (DRISDOL) 1.25 MG (50000 UT) CAPS capsule Take 1 capsule (50,000 Units total) by mouth every 7 (seven) days. 4 capsule 0   No current facility-administered medications on file prior to visit.     PAST MEDICAL HISTORY: Past Medical History:  Diagnosis Date  . Anxiety   . Asthma   . Back pain   . Constipation   . COPD (chronic obstructive pulmonary disease) ([place])   . Depression   . Dyspnea   . Gallbladder problem   . Hyperlipidemia   . Hypertension   . Hypothyroid   . Joint pain   . Leg edema   . Prediabetes     PAST SURGICAL HISTORY: Past Surgical History:  Procedure Laterality Date  . ABDOMINAL HYSTERECTOMY    . CHOLECYSTECTOMY    . hernia repair    . LAPAROSCOPIC GASTRIC BANDING      SOCIAL HISTORY: Social History   Tobacco Use  . Smoking status: Never Smoker  . Smokeless tobacco: Never Used  Substance Use Topics  . Alcohol use: Not on file  . Drug use: Not on file    FAMILY HISTORY: Family History  Problem Relation Age of Onset  . Breast cancer Maternal Aunt   . Hypertension Mother   . Kidney disease Mother   . Obesity Mother   . Stroke  Father   . Heart disease Father   . Alcoholism Father     ROS: Review of Systems  Constitutional: Negative for weight loss.  Gastrointestinal: Negative for nausea and vomiting.  Musculoskeletal:       Negative for muscle weakness  Endo/Heme/Allergies:       Negative for hot/cold intolerance     PHYSICAL EXAM: Blood pressure 108/68, pulse 85, temperature 98 F (36.7 C), temperature source Oral, height 4' 11"  (1.499 m), weight 218 lb (98.9 kg), SpO2 95 %. Body mass index is 44.03 kg/m. Physical Exam Vitals signs reviewed.  Constitutional:      Appearance: Normal appearance. She is obese.  Cardiovascular:     Rate and Rhythm: Normal rate.  Pulses: Normal pulses.  Pulmonary:     Effort: Pulmonary effort is normal.  Musculoskeletal: Normal range of motion.  Skin:    General: Skin is warm and dry.  Neurological:     Mental Status: She is alert and oriented to person, place, and time.  Psychiatric:        Mood and Affect: Mood normal.        Behavior: Behavior normal.     RECENT LABS AND TESTS: BMET    Component Value Date/Time   NA 140 12/10/2018 1543   NA 140 02/01/2018   K 3.8 12/10/2018 1543   CL 107 12/10/2018 1543   CO2 25 12/10/2018 1543   GLUCOSE 99 12/10/2018 1543   BUN 18 12/10/2018 1543   BUN 20 02/01/2018   CREATININE 1.10 (H) 12/10/2018 1543   CALCIUM 8.7 (L) 12/10/2018 1543   GFRNONAA 51 (L) 12/10/2018 1543   GFRAA 59 (L) 12/10/2018 1543   Lab Results  Component Value Date   HGBA1C 5.7 02/01/2018   HGBA1C 5.6 09/25/2017   HGBA1C 5.7 (H) 05/31/2017   Lab Results  Component Value Date   INSULIN 14.6 09/25/2017   INSULIN 19.4 05/31/2017   CBC    Component Value Date/Time   WBC 5.2 12/10/2018 1543   RBC 4.22 12/10/2018 1543   HGB 12.7 12/10/2018 1543   HGB 13.9 05/31/2017 1158   HCT 38.9 12/10/2018 1543   HCT 41.0 05/31/2017 1158   PLT 244 12/10/2018 1543   MCV 92.2 12/10/2018 1543   MCV 90 05/31/2017 1158   MCH 30.1 12/10/2018  1543   MCHC 32.6 12/10/2018 1543   RDW 13.4 12/10/2018 1543   RDW 15.0 05/31/2017 1158   LYMPHSABS 1.6 12/10/2018 1543   LYMPHSABS 1.8 05/31/2017 1158   MONOABS 0.5 12/10/2018 1543   EOSABS 0.1 12/10/2018 1543   EOSABS 0.1 05/31/2017 1158   BASOSABS 0.0 12/10/2018 1543   BASOSABS 0.0 05/31/2017 1158   Iron/TIBC/Ferritin/ %Sat    Component Value Date/Time   IRON 65 12/10/2018 1543   TIBC 422 12/10/2018 1543   FERRITIN 16 12/10/2018 1543   IRONPCTSAT 15 12/10/2018 1543   Lipid Panel     Component Value Date/Time   CHOL 168 02/01/2018   CHOL 165 09/25/2017 1135   TRIG 106 02/01/2018   HDL 71 (A) 02/01/2018   HDL 59 09/25/2017 1135   LDLCALC 76 02/01/2018   LDLCALC 82 09/25/2017 1135   Hepatic Function Panel     Component Value Date/Time   PROT 7.2 12/10/2018 1543   PROT 6.7 09/25/2017 1135   ALBUMIN 4.1 12/10/2018 1543   ALBUMIN 4.5 09/25/2017 1135   AST 23 12/10/2018 1543   ALT 15 12/10/2018 1543   ALKPHOS 80 12/10/2018 1543   BILITOT 0.9 12/10/2018 1543   BILITOT 0.6 09/25/2017 1135      Component Value Date/Time   TSH 1.09 02/01/2018   TSH 0.672 09/25/2017 1135   TSH 1.110 05/31/2017 1158     Ref. Range 09/25/2017 11:35  Vitamin D, 25-Hydroxy Latest Ref Range: 30.0 - 100.0 ng/mL 36.6     OBESITY BEHAVIORAL INTERVENTION VISIT  Today's visit was # 30   Starting weight: 217 lbs Starting date: 05/31/2017 Today's weight :: 218 lb  Today's date: 12/27/2018 Total lbs lost to date: 0 At least 15 minutes were spent on discussing the following behavioral intervention visit.   ASK: We discussed the diagnosis of obesity with Jenny Thomas today and Jenny Thomas agreed to give [name]  permission to discuss obesity behavioral modification therapy today.  ASSESS: Jenny Thomas has the diagnosis of obesity and her BMI today is 44.01 Jenny Thomas is in the action stage of change   ADVISE: Jenny Thomas was educated on the multiple health risks of obesity as well as the benefit of  weight loss to improve her health. She was advised of the need for long term treatment and the importance of lifestyle modifications to improve her current health and to decrease her risk of future health problems.  AGREE: Multiple dietary modification options and treatment options were discussed and  Jenny Thomas agreed to follow the recommendations documented in the above note.  ARRANGE: Jenny Thomas was educated on the importance of frequent visits to treat obesity as outlined per CMS and USPSTF guidelines and agreed to schedule her next follow up appointment today.  I, Tammy Wysor, am acting as Location manager for [employer], PA-C I, Abby Potash, PA-C have reviewed above note and agree with its content

## 2018-12-31 DIAGNOSIS — R0602 Shortness of breath: Secondary | ICD-10-CM | POA: Diagnosis not present

## 2019-01-02 DIAGNOSIS — N952 Postmenopausal atrophic vaginitis: Secondary | ICD-10-CM | POA: Diagnosis not present

## 2019-01-14 ENCOUNTER — Ambulatory Visit (INDEPENDENT_AMBULATORY_CARE_PROVIDER_SITE_OTHER): Payer: Self-pay | Admitting: Physician Assistant

## 2019-01-14 DIAGNOSIS — E039 Hypothyroidism, unspecified: Secondary | ICD-10-CM | POA: Diagnosis not present

## 2019-01-14 DIAGNOSIS — F411 Generalized anxiety disorder: Secondary | ICD-10-CM | POA: Diagnosis not present

## 2019-01-14 DIAGNOSIS — I2699 Other pulmonary embolism without acute cor pulmonale: Secondary | ICD-10-CM | POA: Diagnosis not present

## 2019-01-14 DIAGNOSIS — R1310 Dysphagia, unspecified: Secondary | ICD-10-CM | POA: Diagnosis not present

## 2019-01-14 DIAGNOSIS — N183 Chronic kidney disease, stage 3 (moderate): Secondary | ICD-10-CM | POA: Diagnosis not present

## 2019-01-14 DIAGNOSIS — D6859 Other primary thrombophilia: Secondary | ICD-10-CM | POA: Diagnosis not present

## 2019-01-14 DIAGNOSIS — Z1211 Encounter for screening for malignant neoplasm of colon: Secondary | ICD-10-CM | POA: Diagnosis not present

## 2019-01-14 DIAGNOSIS — J449 Chronic obstructive pulmonary disease, unspecified: Secondary | ICD-10-CM | POA: Diagnosis not present

## 2019-01-14 DIAGNOSIS — D500 Iron deficiency anemia secondary to blood loss (chronic): Secondary | ICD-10-CM | POA: Diagnosis not present

## 2019-01-16 DIAGNOSIS — R0609 Other forms of dyspnea: Secondary | ICD-10-CM | POA: Diagnosis not present

## 2019-01-16 DIAGNOSIS — R05 Cough: Secondary | ICD-10-CM | POA: Diagnosis not present

## 2019-01-16 DIAGNOSIS — J449 Chronic obstructive pulmonary disease, unspecified: Secondary | ICD-10-CM | POA: Diagnosis not present

## 2019-01-16 DIAGNOSIS — I2699 Other pulmonary embolism without acute cor pulmonale: Secondary | ICD-10-CM | POA: Diagnosis not present

## 2019-01-21 ENCOUNTER — Ambulatory Visit (INDEPENDENT_AMBULATORY_CARE_PROVIDER_SITE_OTHER): Payer: Self-pay | Admitting: Physician Assistant

## 2019-01-21 ENCOUNTER — Other Ambulatory Visit: Payer: Self-pay | Admitting: *Deleted

## 2019-01-21 MED ORDER — APIXABAN 5 MG PO TABS: 5.0000 mg | ORAL_TABLET | Freq: Two times a day (BID) | ORAL | 1 refills | Status: DC

## 2019-01-22 ENCOUNTER — Ambulatory Visit (INDEPENDENT_AMBULATORY_CARE_PROVIDER_SITE_OTHER): Payer: Medicare HMO | Admitting: Physician Assistant

## 2019-01-22 ENCOUNTER — Encounter (INDEPENDENT_AMBULATORY_CARE_PROVIDER_SITE_OTHER): Payer: Self-pay | Admitting: Physician Assistant

## 2019-01-22 VITALS — BP 125/72 | HR 70 | Temp 98.1°F | Ht 59.0 in | Wt 215.0 lb

## 2019-01-22 DIAGNOSIS — Z6841 Body Mass Index (BMI) 40.0 and over, adult: Secondary | ICD-10-CM | POA: Diagnosis not present

## 2019-01-22 DIAGNOSIS — E7849 Other hyperlipidemia: Secondary | ICD-10-CM

## 2019-01-22 DIAGNOSIS — E559 Vitamin D deficiency, unspecified: Secondary | ICD-10-CM

## 2019-01-22 MED ORDER — VITAMIN D (ERGOCALCIFEROL) 1.25 MG (50000 UNIT) PO CAPS: 50000.0000 [IU] | ORAL_CAPSULE | ORAL | 0 refills | Status: DC

## 2019-01-22 NOTE — Progress Notes (Signed)
Office: [phone]  /  Fax: [phone]   HPI:   Chief Complaint: OBESITY Jenny Thomas is here to discuss her progress with her obesity treatment plan. She is doing portion control better and make smarter food choices, such as increase vegetables and decrease simple carbohydrates  and is following her eating plan approximately 70% of the time. She states she is walking 10 minutes 5 times per week. Jenny Thomas did well with weight loss. She notes that she has increased the protein in her diet. She also has joined several clubs and states her mood is better.  Her weight is 215 lb (97.5 kg) today and has had a weight loss of 3 pounds over a period of 4 weeks since her last visit. She has lost 2 lbs since starting treatment with [name].  Vitamin D deficiency Jenny Thomas has a diagnosis of Vtamin D deficiency. She is currently taking prescription Vit D and denies nausea, vomiting or muscle weakness.   Hyperlipidemia Jenny Thomas has hyperlipidemia and has been trying to improve her cholesterol levels with intensive lifestyle modification including a low saturated fat diet, exercise and weight loss. Jenny Thomas is on simvastatin. She denies any chest pain.  ASSESSMENT AND PLAN:  Vitamin D deficiency - Plan: Vitamin D, Ergocalciferol, (DRISDOL) 1.25 MG (50000 UT) CAPS capsule  Other hyperlipidemia  Class 3 severe obesity with serious comorbidity and body mass index (BMI) of 40.0 to 44.9 in adult, unspecified obesity type ([place])  PLAN:  Vitamin D Deficiency Jenny Thomas was informed that low Vitamin D levels contributes to fatigue and are associated with obesity, breast, and colon cancer. She agrees to continue to take prescription Vit D @ 50,000 IU every week #4 with no refills and will follow-up for routine testing of Vitamin D, at least 2-3 times per year. She was informed of the risk of over-replacement of Vtamin D and agrees to not increase her dose unless she discusses this with [name] first. Jenny Thomas agrees to follow-up  with our clinic in 3 weeks.  Hyperlipidemia Jenny Thomas was informed of the American Heart Association Guidelines emphasizing intensive lifestyle modifications as the first line treatment for hyperlipidemia. We discussed many lifestyle modifications today in depth, and Jenny Thomas will continue to work on decreasing saturated fats such as fatty red meat, butter and many fried foods. She will also increase vegetables and lean protein in her diet and continue to work on exercise and weight loss efforts. Jenny Thomas will continue taking simvastatin.  Obesity Jenny Thomas is currently in the action stage of change. As such, her goal is to continue with weight loss efforts. She has agreed to portion control better and make smarter food choices, such as increase vegetables and decrease simple carbohydrates. Jenny Thomas has been instructed to work up to a goal of 150 minutes of combined cardio and strengthening exercise per week for weight loss and overall health benefits. We discussed the following Behavioral Modification Strategies today: keeping healthy foods in the home and ways to avoid boredom eating.  Jenny Thomas has agreed to follow up with our clinic in 3 weeks. She was informed of the importance of frequent follow up visits to maximize her success with intensive lifestyle modifications for her multiple health conditions.  ALLERGIES: Allergies  Allergen Reactions  . Bee Pollen Anaphylaxis    Bee stings & bee pollen- anaphylaxis  . Percocet [Oxycodone-Acetaminophen] Nausea And Vomiting    MEDICATIONS: Current Outpatient Medications on File Prior to Visit  Medication Sig Dispense Refill  . ACCU-CHEK FASTCLIX LANCETS MISC 1 each by Does not  apply route 2 (two) times daily. 100 each 0  . albuterol (PROVENTIL HFA;VENTOLIN HFA) 108 (90 Base) MCG/ACT inhaler Inhale into the lungs every 6 (six) hours as needed for wheezing or shortness of breath.    [name] albuterol (PROVENTIL) (2.5 MG/3ML) 0.083% nebulizer solution Take 2.5  mg by nebulization every 6 (six) hours as needed for wheezing or shortness of breath.    [name] alendronate (FOSAMAX) 70 MG tablet Take 70 mg by mouth once a week. Take with a full glass of water on an empty stomach.    [name] apixaban (ELIQUIS) 5 MG TABS tablet Take 1 tablet (5 mg total) by mouth 2 (two) times daily. 60 tablet 1  . Blood Glucose Monitoring Suppl (ACCU-CHEK NANO SMARTVIEW) w/Device KIT 1 each by Does not apply route daily. Check blood sugar twice a day. 1 kit 0  . busPIRone (BUSPAR) 5 MG tablet Take 5 mg by mouth 2 (two) times daily.     . cetirizine (ZYRTEC) 10 MG tablet Take 10 mg by mouth daily.    . citalopram (CELEXA) 40 MG tablet Take 40 mg by mouth daily.    . ferrous sulfate 325 (65 FE) MG EC tablet Take 1 tablet (325 mg total) by mouth daily. 30 tablet 3  . Fluticasone-Salmeterol (ADVAIR) 250-50 MCG/DOSE AEPB Inhale 1 puff into the lungs 2 (two) times daily.    [name] levothyroxine (SYNTHROID, LEVOTHROID) 88 MCG tablet Take 88 mcg by mouth daily before breakfast.    . montelukast (SINGULAIR) 10 MG tablet Take 10 mg by mouth at bedtime.    . simvastatin (ZOCOR) 20 MG tablet Take 20 mg by mouth at bedtime.      No current facility-administered medications on file prior to visit.     PAST MEDICAL HISTORY: Past Medical History:  Diagnosis Date  . Anxiety   . Asthma   . Back pain   . Constipation   . COPD (chronic obstructive pulmonary disease) ([place])   . Depression   . Dyspnea   . Gallbladder problem   . Hyperlipidemia   . Hypertension   . Hypothyroid   . Joint pain   . Leg edema   . Prediabetes     PAST SURGICAL HISTORY: Past Surgical History:  Procedure Laterality Date  . ABDOMINAL HYSTERECTOMY    . CHOLECYSTECTOMY    . hernia repair    . LAPAROSCOPIC GASTRIC BANDING      SOCIAL HISTORY: Social History   Tobacco Use  . Smoking status: Never Smoker  . Smokeless tobacco: Never Used  Substance Use Topics  . Alcohol use: Not on file  . Drug use: Not on file     FAMILY HISTORY: Family History  Problem Relation Age of Onset  . Breast cancer Maternal Aunt   . Hypertension Mother   . Kidney disease Mother   . Obesity Mother   . Stroke Father   . Heart disease Father   . Alcoholism Father    ROS: Review of Systems  Constitutional: Positive for weight loss.  Cardiovascular: Negative for chest pain.  Gastrointestinal: Negative for nausea and vomiting.  Musculoskeletal:       Negative for muscle weakness.  Endo/Heme/Allergies:       Negative for hypoglycemia.   PHYSICAL EXAM: Blood pressure 125/72, pulse 70, temperature 98.1 F (36.7 C), temperature source Oral, height 4' 11"  (1.499 m), weight 215 lb (97.5 kg), SpO2 96 %. Body mass index is 43.42 kg/m. Physical Exam Vitals signs reviewed.  Constitutional:  Appearance: Normal appearance. She is obese.  Cardiovascular:     Rate and Rhythm: Normal rate.     Pulses: Normal pulses.  Pulmonary:     Effort: Pulmonary effort is normal.     Breath sounds: Normal breath sounds.  Musculoskeletal: Normal range of motion.  Skin:    General: Skin is warm and dry.  Neurological:     Mental Status: She is alert and oriented to person, place, and time.  Psychiatric:        Behavior: Behavior normal.   RECENT LABS AND TESTS: BMET    Component Value Date/Time   NA 140 12/10/2018 1543   NA 140 02/01/2018   K 3.8 12/10/2018 1543   CL 107 12/10/2018 1543   CO2 25 12/10/2018 1543   GLUCOSE 99 12/10/2018 1543   BUN 18 12/10/2018 1543   BUN 20 02/01/2018   CREATININE 1.10 (H) 12/10/2018 1543   CALCIUM 8.7 (L) 12/10/2018 1543   GFRNONAA 51 (L) 12/10/2018 1543   GFRAA 59 (L) 12/10/2018 1543   Lab Results  Component Value Date   HGBA1C 5.7 02/01/2018   HGBA1C 5.6 09/25/2017   HGBA1C 5.7 (H) 05/31/2017   Lab Results  Component Value Date   INSULIN 14.6 09/25/2017   INSULIN 19.4 05/31/2017   CBC    Component Value Date/Time   WBC 5.2 12/10/2018 1543   RBC 4.22 12/10/2018 1543    HGB 12.7 12/10/2018 1543   HGB 13.9 05/31/2017 1158   HCT 38.9 12/10/2018 1543   HCT 41.0 05/31/2017 1158   PLT 244 12/10/2018 1543   MCV 92.2 12/10/2018 1543   MCV 90 05/31/2017 1158   MCH 30.1 12/10/2018 1543   MCHC 32.6 12/10/2018 1543   RDW 13.4 12/10/2018 1543   RDW 15.0 05/31/2017 1158   LYMPHSABS 1.6 12/10/2018 1543   LYMPHSABS 1.8 05/31/2017 1158   MONOABS 0.5 12/10/2018 1543   EOSABS 0.1 12/10/2018 1543   EOSABS 0.1 05/31/2017 1158   BASOSABS 0.0 12/10/2018 1543   BASOSABS 0.0 05/31/2017 1158   Iron/TIBC/Ferritin/ %Sat    Component Value Date/Time   IRON 65 12/10/2018 1543   TIBC 422 12/10/2018 1543   FERRITIN 16 12/10/2018 1543   IRONPCTSAT 15 12/10/2018 1543   Lipid Panel     Component Value Date/Time   CHOL 168 02/01/2018   CHOL 165 09/25/2017 1135   TRIG 106 02/01/2018   HDL 71 (A) 02/01/2018   HDL 59 09/25/2017 1135   LDLCALC 76 02/01/2018   LDLCALC 82 09/25/2017 1135   Hepatic Function Panel     Component Value Date/Time   PROT 7.2 12/10/2018 1543   PROT 6.7 09/25/2017 1135   ALBUMIN 4.1 12/10/2018 1543   ALBUMIN 4.5 09/25/2017 1135   AST 23 12/10/2018 1543   ALT 15 12/10/2018 1543   ALKPHOS 80 12/10/2018 1543   BILITOT 0.9 12/10/2018 1543   BILITOT 0.6 09/25/2017 1135      Component Value Date/Time   TSH 1.09 02/01/2018   TSH 0.672 09/25/2017 1135   TSH 1.110 05/31/2017 1158   Results for Jenny, Thomas (MRN [id number]) as of 01/22/2019 11:13  Ref. Range 09/25/2017 11:35  Vitamin D, 25-Hydroxy Latest Ref Range: 30.0 - 100.0 ng/mL 36.6   OBESITY BEHAVIORAL INTERVENTION VISIT  Today's visit was #31   Starting weight: 217 lbs Starting date: 05/31/2017 Today's weight: 215 lbs Today's date: 01/22/2019 Total lbs lost to date: 2 At least 15 minutes were spent on discussing the following behavioral intervention  visit.  ASK: We discussed the diagnosis of obesity with Mayrani F Morris today and Shonya agreed to give [name] permission to  discuss obesity behavioral modification therapy today.  ASSESS: Ethelmae has the diagnosis of obesity and her BMI today is @ 43.42. Pamala is in the action stage of change.   ADVISE: Kynlei was educated on the multiple health risks of obesity as well as the benefit of weight loss to improve her health. She was advised of the need for long term treatment and the importance of lifestyle modifications to improve her current health and to decrease her risk of future health problems.  AGREE: Multiple dietary modification options and treatment options were discussed and  Jenny Thomas agreed to follow the recommendations documented in the above note.  ARRANGE: Jenny Thomas was educated on the importance of frequent visits to treat obesity as outlined per CMS and USPSTF guidelines and agreed to schedule her next follow up appointment today.  Migdalia Dk, am acting as transcriptionist for Abby Potash, PA-C I, Abby Potash, PA-C have reviewed above note and agree with its content

## 2019-01-31 DIAGNOSIS — R0602 Shortness of breath: Secondary | ICD-10-CM | POA: Diagnosis not present

## 2019-02-12 ENCOUNTER — Encounter (INDEPENDENT_AMBULATORY_CARE_PROVIDER_SITE_OTHER): Payer: Self-pay

## 2019-02-12 ENCOUNTER — Ambulatory Visit (INDEPENDENT_AMBULATORY_CARE_PROVIDER_SITE_OTHER): Payer: Self-pay | Admitting: Physician Assistant

## 2019-02-13 ENCOUNTER — Ambulatory Visit (INDEPENDENT_AMBULATORY_CARE_PROVIDER_SITE_OTHER): Payer: Medicare HMO | Admitting: Family Medicine

## 2019-02-13 ENCOUNTER — Encounter (INDEPENDENT_AMBULATORY_CARE_PROVIDER_SITE_OTHER): Payer: Self-pay | Admitting: Family Medicine

## 2019-02-13 VITALS — BP 110/69 | HR 67 | Temp 97.8°F | Ht 59.0 in | Wt 215.0 lb

## 2019-02-13 DIAGNOSIS — E559 Vitamin D deficiency, unspecified: Secondary | ICD-10-CM

## 2019-02-13 DIAGNOSIS — E038 Other specified hypothyroidism: Secondary | ICD-10-CM

## 2019-02-13 DIAGNOSIS — Z6841 Body Mass Index (BMI) 40.0 and over, adult: Secondary | ICD-10-CM

## 2019-02-13 MED ORDER — VITAMIN D (ERGOCALCIFEROL) 1.25 MG (50000 UNIT) PO CAPS: 50000.0000 [IU] | ORAL_CAPSULE | ORAL | 0 refills | Status: AC

## 2019-02-22 ENCOUNTER — Ambulatory Visit: Payer: Self-pay | Admitting: Oncology

## 2019-02-22 ENCOUNTER — Other Ambulatory Visit: Payer: Self-pay

## 2019-02-26 ENCOUNTER — Inpatient Hospital Stay: Payer: Medicare HMO | Admitting: Oncology

## 2019-02-26 ENCOUNTER — Inpatient Hospital Stay: Payer: Medicare HMO

## 2019-02-27 ENCOUNTER — Other Ambulatory Visit: Payer: Self-pay

## 2019-02-27 DIAGNOSIS — D509 Iron deficiency anemia, unspecified: Secondary | ICD-10-CM

## 2019-02-28 ENCOUNTER — Inpatient Hospital Stay: Payer: Medicare HMO | Attending: Oncology

## 2019-02-28 ENCOUNTER — Encounter: Payer: Self-pay | Admitting: Oncology

## 2019-02-28 ENCOUNTER — Other Ambulatory Visit: Payer: Self-pay

## 2019-02-28 ENCOUNTER — Inpatient Hospital Stay (HOSPITAL_BASED_OUTPATIENT_CLINIC_OR_DEPARTMENT_OTHER): Payer: Medicare HMO | Admitting: Oncology

## 2019-02-28 VITALS — BP 121/68 | HR 69 | Temp 96.2°F | Resp 18 | Wt 218.6 lb

## 2019-02-28 DIAGNOSIS — N183 Chronic kidney disease, stage 3 unspecified: Secondary | ICD-10-CM

## 2019-02-28 DIAGNOSIS — Z86711 Personal history of pulmonary embolism: Secondary | ICD-10-CM

## 2019-02-28 DIAGNOSIS — Z7901 Long term (current) use of anticoagulants: Secondary | ICD-10-CM | POA: Diagnosis not present

## 2019-02-28 DIAGNOSIS — I2699 Other pulmonary embolism without acute cor pulmonale: Secondary | ICD-10-CM | POA: Diagnosis not present

## 2019-02-28 DIAGNOSIS — D509 Iron deficiency anemia, unspecified: Secondary | ICD-10-CM | POA: Diagnosis not present

## 2019-02-28 LAB — COMPREHENSIVE METABOLIC PANEL
ALBUMIN: 3.9 g/dL (ref 3.5–5.0)
ALT: 14 U/L (ref 0–44)
ANION GAP: 6 (ref 5–15)
AST: 22 U/L (ref 15–41)
Alkaline Phosphatase: 84 U/L (ref 38–126)
BUN: 19 mg/dL (ref 8–23)
CO2: 26 mmol/L (ref 22–32)
Calcium: 8.6 mg/dL — ABNORMAL LOW (ref 8.9–10.3)
Chloride: 106 mmol/L (ref 98–111)
Creatinine, Ser: 1.14 mg/dL — ABNORMAL HIGH (ref 0.44–1.00)
GFR calc Af Amer: 56 mL/min — ABNORMAL LOW (ref >60)
GFR calc non Af Amer: 49 mL/min — ABNORMAL LOW (ref >60)
Glucose, Bld: 112 mg/dL — ABNORMAL HIGH (ref 70–99)
Potassium: 3.8 mmol/L (ref 3.5–5.1)
Sodium: 138 mmol/L (ref 135–145)
Total Bilirubin: 0.5 mg/dL (ref 0.3–1.2)
Total Protein: 6.9 g/dL (ref 6.5–8.1)

## 2019-02-28 LAB — CBC WITH DIFFERENTIAL/PLATELET
Abs Immature Granulocytes: 0.01 10*3/uL (ref 0.00–0.07)
Basophils Absolute: 0 10*3/uL (ref 0.0–0.1)
Basophils Relative: 0 %
Eosinophils Absolute: 0.2 10*3/uL (ref 0.0–0.5)
Eosinophils Relative: 4 %
HCT: 42.7 % (ref 36.0–46.0)
Hemoglobin: 14.2 g/dL (ref 12.0–15.0)
Immature Granulocytes: 0 %
Lymphocytes Relative: 45 %
Lymphs Abs: 2.2 10*3/uL (ref 0.7–4.0)
MCH: 29.8 pg (ref 26.0–34.0)
MCHC: 33.3 g/dL (ref 30.0–36.0)
MCV: 89.7 fL (ref 80.0–100.0)
Monocytes Absolute: 0.4 10*3/uL (ref 0.1–1.0)
Monocytes Relative: 8 %
Neutro Abs: 2.1 10*3/uL (ref 1.7–7.7)
Neutrophils Relative %: 43 %
PLATELETS: 207 10*3/uL (ref 150–400)
RBC: 4.76 MIL/uL (ref 3.87–5.11)
RDW: 13.1 % (ref 11.5–15.5)
WBC: 4.8 10*3/uL (ref 4.0–10.5)
nRBC: 0 % (ref 0.0–0.2)

## 2019-02-28 MED ORDER — APIXABAN 5 MG PO TABS: 5.0000 mg | ORAL_TABLET | Freq: Two times a day (BID) | ORAL | 2 refills | Status: DC

## 2019-02-28 NOTE — Progress Notes (Signed)
Patient here for follow up. No concerns voiced.  °

## 2019-02-28 NOTE — Progress Notes (Signed)
Hematology/Oncology Consult note [facility] Telephone:([phone] Fax:([phone]   Patient Care Team: [name], MD as PCP - General (Family Medicine)  REFERRING PROVIDER: Dr.Chen CHIEF COMPLAINTS/REASON FOR VISIT:  Evaluation of PE  HISTORY OF PRESENTING ILLNESS:  Jenny Thomas is a  73 y.o.  female with PMH listed below who was referred to me for evaluation of PE management.  Present to emergency room on 11/23/2018 for evaluation progressive shortness of breath.   CT chest angiogram showed bilateral pulmonary emboli, right greater than left, involving segmental branches of all lobes of the right lung and the left lower lobe.  CT findings equivocal for right heart strain with strengthening after interval ventricular septum and RV/LV ratio of approximately 1. 2D echo showed left ventricular function 55 to 60%, wall motion was normal.  No regional wall motion abnormalities.  Moderate regurgitation.  Right ventricle mildly dilated.  Double thickness was normal.  Systolic function was low normal.  Right atrium was mildly dilated.  Tricuspid valve moderate regurgitation.  Pulmonary arteries systolic pressure was moderately elevated.  PA peak pressure 46 mm Hg No lower extremity DVT was found. Patient was started on IV heparin. Switch to Eliquis at discharge. Denies any proceeding immobilizing factors.   Reports feeling well.  Currently takes Eliquis 5 mg plan.  Tolerates well.Denies hematochezia, hematuria, hematemesis, epistaxis, black tarry stool or easy bruising. ' Reports breathing has improved significantly.  INTERVAL HISTORY Jenny Thomas is a 73 y.o. female who has above history reviewed by me today presents for follow up visit for management of unprovoked pulmonary embolism. Problems and complaints are listed below: Patient reports doing well at baseline.  She has worsening of allergic symptoms since a few weeks ago.  Occasional cough.    Breathing well.  No shortness of breath. She takes Eliquis 5 mg twice daily. Denies hematochezia, hematuria, hematemesis, epistaxis, black tarry stool or easy bruising.  Reports that she develops lower extremity " bumps" and swelling, intermittently.  Denies any calf tenderness.  Bumps and swelling spontaneously resolves.  Patient has screening colonoscopy scheduled next month. Appetite is pretty good.  She has lost it 4 pounds since last visit with me. Patient takes over-the-counter ferrous sulfate supplementation once a day. Review of Systems  Constitutional: Negative for appetite change, chills, fatigue and fever.  HENT:   Negative for hearing loss and voice change.   Eyes: Negative for eye problems.  Respiratory: Negative for chest tightness and cough.   Cardiovascular: Negative for chest pain.  Gastrointestinal: Negative for abdominal distention, abdominal pain and blood in stool.  Endocrine: Negative for hot flashes.  Genitourinary: Negative for difficulty urinating and frequency.   Musculoskeletal: Negative for arthralgias.  Skin: Negative for itching and rash.  Neurological: Negative for extremity weakness.  Hematological: Negative for adenopathy.  Psychiatric/Behavioral: Negative for confusion.    MEDICAL HISTORY:  Past Medical History:  Diagnosis Date  . Anxiety   . Asthma   . Back pain   . Constipation   . COPD (chronic obstructive pulmonary disease) ([place])   . Depression   . Dyspnea   . Gallbladder problem   . Hyperlipidemia   . Hypertension   . Hypothyroid   . Joint pain   . Leg edema   . Prediabetes     SURGICAL HISTORY: Past Surgical History:  Procedure Laterality Date  . ABDOMINAL HYSTERECTOMY    . CHOLECYSTECTOMY    . hernia repair    . LAPAROSCOPIC GASTRIC BANDING  SOCIAL HISTORY: Social History   Socioeconomic History  . Marital status: Single    Spouse name: Not on file  . Number of children: Not on file  . Years of education: Not on  file  . Highest education level: Not on file  Occupational History  . Occupation: Retired  Scientific laboratory technician  . Financial resource strain: Not on file  . Food insecurity:    Worry: Not on file    Inability: Not on file  . Transportation needs:    Medical: Not on file    Non-medical: Not on file  Tobacco Use  . Smoking status: Never Smoker  . Smokeless tobacco: Never Used  Substance and Sexual Activity  . Alcohol use: Not on file  . Drug use: Not on file  . Sexual activity: Not on file  Lifestyle  . Physical activity:    Days per week: Not on file    Minutes per session: Not on file  . Stress: Not on file  Relationships  . Social connections:    Talks on phone: Not on file    Gets together: Not on file    Attends religious service: Not on file    Active member of club or organization: Not on file    Attends meetings of clubs or organizations: Not on file    Relationship status: Not on file  . Intimate partner violence:    Fear of current or ex partner: Not on file    Emotionally abused: Not on file    Physically abused: Not on file    Forced sexual activity: Not on file  Other Topics Concern  . Not on file  Social History Narrative  . Not on file    FAMILY HISTORY: Family History  Problem Relation Age of Onset  . Breast cancer Maternal Aunt   . Hypertension Mother   . Kidney disease Mother   . Obesity Mother   . Stroke Father   . Heart disease Father   . Alcoholism Father     ALLERGIES:  is allergic to bee pollen and percocet [oxycodone-acetaminophen].  MEDICATIONS:  Current Outpatient Medications  Medication Sig Dispense Refill  . ACCU-CHEK FASTCLIX LANCETS MISC 1 each by Does not apply route 2 (two) times daily. 100 each 0  . albuterol (PROVENTIL HFA;VENTOLIN HFA) 108 (90 Base) MCG/ACT inhaler Inhale into the lungs every 6 (six) hours as needed for wheezing or shortness of breath.    [name] albuterol (PROVENTIL) (2.5 MG/3ML) 0.083% nebulizer solution Take 2.5 mg  by nebulization every 6 (six) hours as needed for wheezing or shortness of breath.    [name] alendronate (FOSAMAX) 70 MG tablet Take 70 mg by mouth once a week. Take with a full glass of water on an empty stomach.    [name] apixaban (ELIQUIS) 5 MG TABS tablet Take 1 tablet (5 mg total) by mouth 2 (two) times daily. 60 tablet 1  . Blood Glucose Monitoring Suppl (ACCU-CHEK NANO SMARTVIEW) w/Device KIT 1 each by Does not apply route daily. Check blood sugar twice a day. 1 kit 0  . busPIRone (BUSPAR) 5 MG tablet Take 5 mg by mouth 2 (two) times daily.     . cetirizine (ZYRTEC) 10 MG tablet Take 10 mg by mouth daily.    . citalopram (CELEXA) 40 MG tablet Take 40 mg by mouth daily.    . ferrous sulfate 325 (65 FE) MG EC tablet Take 1 tablet (325 mg total) by mouth daily. 30 tablet 3  .  Fluticasone-Salmeterol (ADVAIR) 250-50 MCG/DOSE AEPB Inhale 1 puff into the lungs 2 (two) times daily.    [name] levothyroxine (SYNTHROID, LEVOTHROID) 88 MCG tablet Take 88 mcg by mouth daily before breakfast.    . montelukast (SINGULAIR) 10 MG tablet Take 10 mg by mouth at bedtime.    . simvastatin (ZOCOR) 20 MG tablet Take 20 mg by mouth at bedtime.     . Vitamin D, Ergocalciferol, (DRISDOL) 1.25 MG (50000 UT) CAPS capsule Take 1 capsule (50,000 Units total) by mouth every 7 (seven) days. 4 capsule 0   No current facility-administered medications for this visit.      PHYSICAL EXAMINATION: ECOG PERFORMANCE STATUS: 0 - Asymptomatic Vitals:   02/28/19 0846  BP: 121/68  Pulse: 69  Resp: 18  Temp: (!) 96.2 F (35.7 C)   Filed Weights   02/28/19 0846  Weight: 218 lb 9.6 oz (99.2 kg)    Physical Exam Constitutional:      General: She is not in acute distress. HENT:     Head: Normocephalic and atraumatic.     Nose:     Comments: Seasonal allergy symptoms Eyes:     General: No scleral icterus.    Pupils: Pupils are equal, round, and reactive to light.  Neck:     Musculoskeletal: Normal range of motion and neck supple.   Cardiovascular:     Rate and Rhythm: Normal rate and regular rhythm.     Heart sounds: Normal heart sounds.  Pulmonary:     Effort: Pulmonary effort is normal. No respiratory distress.     Breath sounds: No wheezing.  Abdominal:     General: Bowel sounds are normal. There is no distension.     Palpations: Abdomen is soft. There is no mass.     Tenderness: There is no abdominal tenderness.  Musculoskeletal: Normal range of motion.        General: No deformity.  Skin:    General: Skin is warm and dry.     Findings: No erythema or rash.  Neurological:     Mental Status: She is alert and oriented to person, place, and time.     Cranial Nerves: No cranial nerve deficit.     Coordination: Coordination normal.  Psychiatric:        Behavior: Behavior normal.        Thought Content: Thought content normal.      LABORATORY DATA:  I have reviewed the data as listed Lab Results  Component Value Date   WBC 4.8 02/28/2019   HGB 14.2 02/28/2019   HCT 42.7 02/28/2019   MCV 89.7 02/28/2019   PLT 207 02/28/2019   Recent Labs    11/24/18 0357 11/26/18 0546 12/10/18 1543  NA 138 139 140  K 3.8 3.8 3.8  CL 104 107 107  CO2 25 27 25   GLUCOSE 212* 93 99  BUN 23 27* 18  CREATININE 1.18* 1.15* 1.10*  CALCIUM 8.4* 7.9* 8.7*  GFRNONAA 47* 48* 51*  GFRAA 54* 56* 59*  PROT  --   --  7.2  ALBUMIN  --   --  4.1  AST  --   --  23  ALT  --   --  15  ALKPHOS  --   --  80  BILITOT  --   --  0.9   Iron/TIBC/Ferritin/ %Sat    Component Value Date/Time   IRON 65 12/10/2018 1543   TIBC 422 12/10/2018 1543   FERRITIN 16 12/10/2018 1543  IRONPCTSAT 15 12/10/2018 1543        ASSESSMENT & PLAN:  1. History of pulmonary embolism   2. Iron deficiency anemia, unspecified iron deficiency anemia type   3. Stage 3 chronic kidney disease (HCC)    Unprovoked PE, recommend long term anticoagulation.  Patient tolerates Eliquis 5 mg twice daily very well.  No clinical signs of bleeding.  Discussed with patient that that I recommend patient to complete total of 6 months of Eliquis 5 mg twice daily.  After that she will be reevaluated and be switched to Eliquis 2.5 twice daily for maintenance.  Refills for Eliquis 5 mg twice daily sent to pharmacy. Negative factor V Leiden mutation and prothrombin mutation.  Iron deficiency anemia, repeat labs indicates complete normalization of hemoglobin. Patient may continue over-the-counter oral iron supplementation.  Advised patient to hold iron supplements 1 week prior to upcoming colonoscopy-previously discussed with GI. Proceed with colonoscopy without holding patient's blood thinners. CKD, creatinine stable.  Orders Placed This Encounter  Procedures  . Comprehensive metabolic panel    Standing Status:   Future    Standing Expiration Date:   02/28/2020  . CBC with Differential/Platelet    Standing Status:   Future    Standing Expiration Date:   02/28/2020    All questions were answered. The patient knows to call the clinic with any problems questions or concerns.  Return of visit: 3 months with repeat labs and MD assessment.   Earlie Server, MD, PhD Hematology Oncology [facility] Pager- [phone] 02/28/2019

## 2019-03-01 DIAGNOSIS — R0602 Shortness of breath: Secondary | ICD-10-CM | POA: Diagnosis not present

## 2019-03-06 ENCOUNTER — Ambulatory Visit (INDEPENDENT_AMBULATORY_CARE_PROVIDER_SITE_OTHER): Payer: Self-pay | Admitting: Family Medicine

## 2019-03-07 ENCOUNTER — Encounter (INDEPENDENT_AMBULATORY_CARE_PROVIDER_SITE_OTHER): Payer: Self-pay

## 2019-03-12 ENCOUNTER — Ambulatory Visit: Admit: 2019-03-12 | Payer: Medicare HMO | Admitting: Internal Medicine

## 2019-03-12 SURGERY — COLONOSCOPY WITH PROPOFOL
Anesthesia: General

## 2019-03-13 DIAGNOSIS — E7800 Pure hypercholesterolemia, unspecified: Secondary | ICD-10-CM | POA: Diagnosis not present

## 2019-03-13 DIAGNOSIS — E039 Hypothyroidism, unspecified: Secondary | ICD-10-CM | POA: Diagnosis not present

## 2019-04-01 DIAGNOSIS — R0602 Shortness of breath: Secondary | ICD-10-CM | POA: Diagnosis not present

## 2019-04-15 DIAGNOSIS — H524 Presbyopia: Secondary | ICD-10-CM | POA: Diagnosis not present

## 2019-05-29 ENCOUNTER — Telehealth: Payer: Self-pay | Admitting: Oncology

## 2019-05-29 NOTE — Telephone Encounter (Signed)
Called pt for appt pre-screen but got no answer. Left vm msg about screening and new guidelines about mask req, no visitors, screening questions they will be asked, and fever checks °

## 2019-05-30 ENCOUNTER — Other Ambulatory Visit: Payer: Self-pay

## 2019-05-30 ENCOUNTER — Inpatient Hospital Stay: Payer: Medicare HMO | Attending: Oncology

## 2019-05-30 ENCOUNTER — Telehealth: Payer: Self-pay | Admitting: Oncology

## 2019-05-30 DIAGNOSIS — Z7901 Long term (current) use of anticoagulants: Secondary | ICD-10-CM | POA: Insufficient documentation

## 2019-05-30 DIAGNOSIS — Z86711 Personal history of pulmonary embolism: Secondary | ICD-10-CM

## 2019-05-30 DIAGNOSIS — Z79899 Other long term (current) drug therapy: Secondary | ICD-10-CM | POA: Insufficient documentation

## 2019-05-30 DIAGNOSIS — R600 Localized edema: Secondary | ICD-10-CM | POA: Insufficient documentation

## 2019-05-30 DIAGNOSIS — E039 Hypothyroidism, unspecified: Secondary | ICD-10-CM | POA: Insufficient documentation

## 2019-05-30 DIAGNOSIS — J449 Chronic obstructive pulmonary disease, unspecified: Secondary | ICD-10-CM | POA: Diagnosis not present

## 2019-05-30 DIAGNOSIS — Z5181 Encounter for therapeutic drug level monitoring: Secondary | ICD-10-CM | POA: Insufficient documentation

## 2019-05-30 DIAGNOSIS — R0982 Postnasal drip: Secondary | ICD-10-CM | POA: Insufficient documentation

## 2019-05-30 DIAGNOSIS — I10 Essential (primary) hypertension: Secondary | ICD-10-CM | POA: Insufficient documentation

## 2019-05-30 DIAGNOSIS — E785 Hyperlipidemia, unspecified: Secondary | ICD-10-CM | POA: Insufficient documentation

## 2019-05-30 DIAGNOSIS — Z7951 Long term (current) use of inhaled steroids: Secondary | ICD-10-CM | POA: Diagnosis not present

## 2019-05-30 LAB — CBC WITH DIFFERENTIAL/PLATELET
Abs Immature Granulocytes: 0.01 10*3/uL (ref 0.00–0.07)
Basophils Absolute: 0 10*3/uL (ref 0.0–0.1)
Basophils Relative: 1 %
Eosinophils Absolute: 0.2 10*3/uL (ref 0.0–0.5)
Eosinophils Relative: 4 %
HCT: 42.4 % (ref 36.0–46.0)
Hemoglobin: 14 g/dL (ref 12.0–15.0)
Immature Granulocytes: 0 %
Lymphocytes Relative: 36 %
Lymphs Abs: 1.9 10*3/uL (ref 0.7–4.0)
MCH: 29.9 pg (ref 26.0–34.0)
MCHC: 33 g/dL (ref 30.0–36.0)
MCV: 90.4 fL (ref 80.0–100.0)
Monocytes Absolute: 0.5 10*3/uL (ref 0.1–1.0)
Monocytes Relative: 10 %
Neutro Abs: 2.6 10*3/uL (ref 1.7–7.7)
Neutrophils Relative %: 49 %
Platelets: 220 10*3/uL (ref 150–400)
RBC: 4.69 MIL/uL (ref 3.87–5.11)
RDW: 14 % (ref 11.5–15.5)
WBC: 5.3 10*3/uL (ref 4.0–10.5)
nRBC: 0 % (ref 0.0–0.2)

## 2019-05-30 LAB — COMPREHENSIVE METABOLIC PANEL
ALT: 15 U/L (ref 0–44)
AST: 23 U/L (ref 15–41)
Albumin: 4 g/dL (ref 3.5–5.0)
Alkaline Phosphatase: 86 U/L (ref 38–126)
Anion gap: 8 (ref 5–15)
BUN: 16 mg/dL (ref 8–23)
CO2: 26 mmol/L (ref 22–32)
Calcium: 8.7 mg/dL — ABNORMAL LOW (ref 8.9–10.3)
Chloride: 106 mmol/L (ref 98–111)
Creatinine, Ser: 1.22 mg/dL — ABNORMAL HIGH (ref 0.44–1.00)
GFR calc Af Amer: 52 mL/min — ABNORMAL LOW (ref >60)
GFR calc non Af Amer: 45 mL/min — ABNORMAL LOW (ref >60)
Glucose, Bld: 110 mg/dL — ABNORMAL HIGH (ref 70–99)
Potassium: 4.1 mmol/L (ref 3.5–5.1)
Sodium: 140 mmol/L (ref 135–145)
Total Bilirubin: 0.7 mg/dL (ref 0.3–1.2)
Total Protein: 7 g/dL (ref 6.5–8.1)

## 2019-05-31 ENCOUNTER — Other Ambulatory Visit: Payer: Self-pay

## 2019-05-31 ENCOUNTER — Encounter: Payer: Self-pay | Admitting: Oncology

## 2019-05-31 ENCOUNTER — Inpatient Hospital Stay (HOSPITAL_BASED_OUTPATIENT_CLINIC_OR_DEPARTMENT_OTHER): Payer: Medicare HMO | Admitting: Oncology

## 2019-05-31 DIAGNOSIS — Z7901 Long term (current) use of anticoagulants: Secondary | ICD-10-CM | POA: Insufficient documentation

## 2019-05-31 DIAGNOSIS — I2699 Other pulmonary embolism without acute cor pulmonale: Secondary | ICD-10-CM | POA: Diagnosis not present

## 2019-05-31 MED ORDER — APIXABAN 2.5 MG PO TABS: 2.5000 mg | ORAL_TABLET | Freq: Two times a day (BID) | ORAL | 3 refills | Status: DC

## 2019-05-31 NOTE — Progress Notes (Signed)
HEMATOLOGY-ONCOLOGY TeleHEALTH VISIT PROGRESS NOTE  I connected with Jenny Thomas on 05/31/19 at  8:30 AM EDT by video enabled telemedicine visit and verified that I am speaking with the correct person using two identifiers. I discussed the limitations, risks, security and privacy concerns of performing an evaluation and management service by telemedicine and the availability of in-person appointments. I also discussed with the patient that there may be a patient responsible charge related to this service. The patient expressed understanding and agreed to proceed.   Other persons participating in the visit and their role in the encounter:  Janeann Merl, RN, check in patient.   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   INTERVAL HISTORY Jenny Thomas is a 73 y.o. 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 Problems and complaints are listed below:  Patient has been taking Eliquis 5 mg twice daily tolerating well. Denies hematochezia, hematuria, hematemesis, epistaxis, black tarry stool or easy bruising.  Reports some sinus postnasal drip and mild cough.  Otherwise no new complaints. Chronic lower extremity swelling bilaterally unchanged.  Review of Systems  Constitutional: Negative for appetite change, chills, fatigue and fever.  HENT:   Negative for hearing loss and voice change.        Postnasal drip  Eyes: Negative for eye problems.  Respiratory: Positive for cough. Negative for chest tightness.   Cardiovascular: Negative for chest pain.       Chronic leg swelling  Gastrointestinal: Negative for abdominal distention, abdominal pain and blood in stool.  Endocrine: Negative for hot flashes.  Genitourinary: Negative for difficulty urinating  and frequency.   Musculoskeletal: Negative for arthralgias.  Skin: Negative for itching and rash.  Neurological: Negative for extremity weakness.  Hematological: Negative for adenopathy.  Psychiatric/Behavioral: Negative for confusion.    Past Medical History:  Diagnosis Date  . Anxiety   . Asthma   . Back pain   . Constipation   . COPD (chronic obstructive pulmonary disease) ([place])   . Depression   . Dyspnea   . Gallbladder problem   . Hyperlipidemia   . Hypertension   . Hypothyroid   . Joint pain   . Leg edema   . Prediabetes    Past Surgical History:  Procedure Laterality Date  . ABDOMINAL HYSTERECTOMY    . CHOLECYSTECTOMY    . hernia repair    . LAPAROSCOPIC GASTRIC BANDING      Family History  Problem Relation Age of Onset  . Breast cancer Maternal Aunt   . Hypertension Mother   . Kidney disease Mother   . Obesity Mother   . Stroke Father   . Heart disease Father   . Alcoholism Father     Social History   Socioeconomic History  . Marital status: Single    Spouse name: Not on file  . Number of children: Not on file  . Years of education: Not on file  . Highest education level: Not on file  Occupational History  . Occupation: Retired  Scientific laboratory technician  . Financial resource strain: Not on file  . Food insecurity    Worry: Not on file    Inability: Not on file  . Transportation needs    Medical: Not on file    Non-medical: Not on file  Tobacco Use  .  Smoking status: Never Smoker  . Smokeless tobacco: Never Used  Substance and Sexual Activity  . Alcohol use: Not on file  . Drug use: Not on file  . Sexual activity: Not on file  Lifestyle  . Physical activity    Days per week: Not on file    Minutes per session: Not on file  . Stress: Not on file  Relationships  . Social Herbalist on phone: Not on file    Gets together: Not on file    Attends religious service: Not on file    Active member of club or organization: Not on file    Attends  meetings of clubs or organizations: Not on file    Relationship status: Not on file  . Intimate partner violence    Fear of current or ex partner: Not on file    Emotionally abused: Not on file    Physically abused: Not on file    Forced sexual activity: Not on file  Other Topics Concern  . Not on file  Social History Narrative  . Not on file    Current Outpatient Medications on File Prior to Visit  Medication Sig Dispense Refill  . ACCU-CHEK FASTCLIX LANCETS MISC 1 each by Does not apply route 2 (two) times daily. 100 each 0  . albuterol (PROVENTIL HFA;VENTOLIN HFA) 108 (90 Base) MCG/ACT inhaler Inhale into the lungs every 6 (six) hours as needed for wheezing or shortness of breath.    [name] albuterol (PROVENTIL) (2.5 MG/3ML) 0.083% nebulizer solution Take 2.5 mg by nebulization every 6 (six) hours as needed for wheezing or shortness of breath.    [name] alendronate (FOSAMAX) 70 MG tablet Take 70 mg by mouth once a week. Take with a full glass of water on an empty stomach.    . Blood Glucose Monitoring Suppl (ACCU-CHEK NANO SMARTVIEW) w/Device KIT 1 each by Does not apply route daily. Check blood sugar twice a day. 1 kit 0  . busPIRone (BUSPAR) 5 MG tablet Take 5 mg by mouth 2 (two) times daily.     . cetirizine (ZYRTEC) 10 MG tablet Take 10 mg by mouth daily.    . chlorhexidine (PERIDEX) 0.12 % solution     . citalopram (CELEXA) 40 MG tablet Take 40 mg by mouth daily.    . ferrous sulfate 325 (65 FE) MG EC tablet Take 1 tablet (325 mg total) by mouth daily. 30 tablet 3  . Fluticasone-Salmeterol (ADVAIR) 250-50 MCG/DOSE AEPB Inhale 1 puff into the lungs 2 (two) times daily.    [name] levothyroxine (SYNTHROID, LEVOTHROID) 88 MCG tablet Take 88 mcg by mouth daily before breakfast.    . montelukast (SINGULAIR) 10 MG tablet Take 10 mg by mouth at bedtime.    . Multiple Vitamin (MULTI-VITAMIN) tablet Take by mouth.    . simvastatin (ZOCOR) 20 MG tablet Take 20 mg by mouth at bedtime.     . benzonatate  (TESSALON) 200 MG capsule TAKE 1 CAPSULE BY MOUTH THREE TIMES DAILY AS NEEDED FOR COUGH    . Vitamin D, Ergocalciferol, (DRISDOL) 1.25 MG (50000 UT) CAPS capsule Take 1 capsule (50,000 Units total) by mouth every 7 (seven) days. (Patient not taking: Reported on 05/31/2019) 4 capsule 0   No current facility-administered medications on file prior to visit.     Allergies  Allergen Reactions  . Bee Pollen Anaphylaxis    Bee stings & bee pollen- anaphylaxis  . Percocet [Oxycodone-Acetaminophen] Nausea And Vomiting  Observations/Objective: Today's Vitals   05/31/19 0826  PainSc: 0-No pain   There is no height or weight on file to calculate BMI.  Physical Exam  Constitutional: She is oriented to person, place, and time. No distress.  HENT:  Head: Normocephalic and atraumatic.  Pulmonary/Chest: Effort normal.  Neurological: She is alert and oriented to person, place, and time.  Psychiatric: Affect normal.    CBC    Component Value Date/Time   WBC 5.3 05/30/2019 1055   RBC 4.69 05/30/2019 1055   HGB 14.0 05/30/2019 1055   HGB 13.9 05/31/2017 1158   HCT 42.4 05/30/2019 1055   HCT 41.0 05/31/2017 1158   PLT 220 05/30/2019 1055   MCV 90.4 05/30/2019 1055   MCV 90 05/31/2017 1158   MCH 29.9 05/30/2019 1055   MCHC 33.0 05/30/2019 1055   RDW 14.0 05/30/2019 1055   RDW 15.0 05/31/2017 1158   LYMPHSABS 1.9 05/30/2019 1055   LYMPHSABS 1.8 05/31/2017 1158   MONOABS 0.5 05/30/2019 1055   EOSABS 0.2 05/30/2019 1055   EOSABS 0.1 05/31/2017 1158   BASOSABS 0.0 05/30/2019 1055   BASOSABS 0.0 05/31/2017 1158    CMP     Component Value Date/Time   NA 140 05/30/2019 1055   NA 140 02/01/2018   K 4.1 05/30/2019 1055   CL 106 05/30/2019 1055   CO2 26 05/30/2019 1055   GLUCOSE 110 (H) 05/30/2019 1055   BUN 16 05/30/2019 1055   BUN 20 02/01/2018   CREATININE 1.22 (H) 05/30/2019 1055   CALCIUM 8.7 (L) 05/30/2019 1055   PROT 7.0 05/30/2019 1055   PROT 6.7 09/25/2017 1135    ALBUMIN 4.0 05/30/2019 1055   ALBUMIN 4.5 09/25/2017 1135   AST 23 05/30/2019 1055   ALT 15 05/30/2019 1055   ALKPHOS 86 05/30/2019 1055   BILITOT 0.7 05/30/2019 1055   BILITOT 0.6 09/25/2017 1135   GFRNONAA 45 (L) 05/30/2019 1055   GFRAA 52 (L) 05/30/2019 1055     Assessment and Plan: 1. Bilateral pulmonary embolism ([place])   2. Chronic anticoagulation     Labs are reviewed and discussed with patient.  Stable hemoglobin.  Tolerates anticoagulation well and has finished 6 months of full strength Eliquis 5 mg twice daily. Unprovoked PE, recommend long-term anticoagulation. Recommend Eliquis 2.5 mg twice daily for maintenance therapy. Patient still has about 2 weeks of Eliquis 5 mg by and advised patient to finish current supply and fill new prescription of Eliquis 2.5 mg twice daily.  Prescription sent to pharmacy. She knows to seek medical advice immediately if she experience any breathing difficulties.   Follow Up Instructions: Follow-up in 3 months.  Repeat CBC and CMP.  I discussed the assessment and treatment plan with the patient. The patient was provided an opportunity to ask questions and all were answered. The patient agreed with the plan and demonstrated an understanding of the instructions.  The patient was advised to call back or seek an in-person evaluation if the symptoms worsen or if the condition fails to improve as anticipated.   I provided 15 minutes of face-to-face video visit time during this encounter, and > 50% was spent counseling as documented under my assessment & plan.  Earlie Server, MD 05/31/2019 8:39 AM

## 2019-05-31 NOTE — Progress Notes (Signed)
Patient contacted for telehealth visit. No concerns voiced.  

## 2019-06-17 DIAGNOSIS — E109 Type 1 diabetes mellitus without complications: Secondary | ICD-10-CM | POA: Diagnosis not present

## 2019-06-17 DIAGNOSIS — H40013 Open angle with borderline findings, low risk, bilateral: Secondary | ICD-10-CM | POA: Diagnosis not present

## 2019-06-17 DIAGNOSIS — Z961 Presence of intraocular lens: Secondary | ICD-10-CM | POA: Diagnosis not present

## 2019-06-17 DIAGNOSIS — H26492 Other secondary cataract, left eye: Secondary | ICD-10-CM | POA: Diagnosis not present

## 2019-06-19 ENCOUNTER — Telehealth: Payer: Self-pay | Admitting: *Deleted

## 2019-06-19 DIAGNOSIS — F3340 Major depressive disorder, recurrent, in remission, unspecified: Secondary | ICD-10-CM | POA: Diagnosis not present

## 2019-06-19 DIAGNOSIS — E7800 Pure hypercholesterolemia, unspecified: Secondary | ICD-10-CM | POA: Diagnosis not present

## 2019-06-19 DIAGNOSIS — I10 Essential (primary) hypertension: Secondary | ICD-10-CM | POA: Diagnosis not present

## 2019-06-19 DIAGNOSIS — Z6841 Body Mass Index (BMI) 40.0 and over, adult: Secondary | ICD-10-CM | POA: Diagnosis not present

## 2019-06-19 DIAGNOSIS — F411 Generalized anxiety disorder: Secondary | ICD-10-CM | POA: Diagnosis not present

## 2019-06-19 DIAGNOSIS — N183 Chronic kidney disease, stage 3 (moderate): Secondary | ICD-10-CM | POA: Diagnosis not present

## 2019-06-19 DIAGNOSIS — E039 Hypothyroidism, unspecified: Secondary | ICD-10-CM | POA: Diagnosis not present

## 2019-06-19 NOTE — Telephone Encounter (Signed)
They were asking about the Plavix

## 2019-06-19 NOTE — Telephone Encounter (Signed)
Plavix is not on her list of current medications, nor is it in her medication history.  Please confirm what is being requested.

## 2019-06-19 NOTE — Telephone Encounter (Signed)
Ok to hold Eliquis for up to 3 days prior to procedure. Resume after procedure if hemostasis achieved. Thank you.

## 2019-06-19 NOTE — Telephone Encounter (Signed)
Dr Alice Reichert is asking if patient can hold her Plavix for her procedure Tuesday. Please return call to [place]

## 2019-06-19 NOTE — Telephone Encounter (Signed)
I called Robin back and confirmed she was asking about Eliquis and that we did not have her as taking Plavix,she apologized that she had asked about the wrong medicine and it is in fact her Eliquis so I told her per Dr Tasia Catchings, she can hold it for up to 3 days prior to procedure

## 2019-06-20 ENCOUNTER — Other Ambulatory Visit (HOSPITAL_COMMUNITY): Payer: Self-pay | Admitting: Gastroenterology

## 2019-06-20 ENCOUNTER — Other Ambulatory Visit: Payer: Self-pay | Admitting: Gastroenterology

## 2019-06-20 DIAGNOSIS — R1319 Other dysphagia: Secondary | ICD-10-CM

## 2019-06-20 DIAGNOSIS — R1310 Dysphagia, unspecified: Secondary | ICD-10-CM

## 2019-06-21 ENCOUNTER — Other Ambulatory Visit: Admission: RE | Admit: 2019-06-21 | Payer: Medicare HMO | Source: Ambulatory Visit

## 2019-06-26 ENCOUNTER — Other Ambulatory Visit: Payer: Self-pay

## 2019-06-26 ENCOUNTER — Ambulatory Visit
Admission: RE | Admit: 2019-06-26 | Discharge: 2019-06-26 | Disposition: A | Discharge status: Home / Self Care | Payer: Medicare HMO | Source: Ambulatory Visit | Attending: Gastroenterology | Admitting: Gastroenterology

## 2019-06-26 DIAGNOSIS — R1310 Dysphagia, unspecified: Secondary | ICD-10-CM | POA: Diagnosis not present

## 2019-06-26 DIAGNOSIS — R1319 Other dysphagia: Secondary | ICD-10-CM

## 2019-07-23 DIAGNOSIS — R05 Cough: Secondary | ICD-10-CM | POA: Diagnosis not present

## 2019-07-23 DIAGNOSIS — J4520 Mild intermittent asthma, uncomplicated: Secondary | ICD-10-CM | POA: Diagnosis not present

## 2019-07-23 DIAGNOSIS — J310 Chronic rhinitis: Secondary | ICD-10-CM | POA: Diagnosis not present

## 2019-08-27 ENCOUNTER — Other Ambulatory Visit: Payer: Self-pay

## 2019-08-27 ENCOUNTER — Inpatient Hospital Stay: Payer: Medicare HMO | Attending: Oncology

## 2019-08-27 ENCOUNTER — Inpatient Hospital Stay: Payer: Medicare HMO | Admitting: Oncology

## 2019-08-27 ENCOUNTER — Encounter: Payer: Self-pay | Admitting: Oncology

## 2019-08-27 VITALS — BP 134/78 | HR 82 | Temp 99.0°F | Resp 18 | Wt 233.8 lb

## 2019-08-27 DIAGNOSIS — N183 Chronic kidney disease, stage 3 unspecified: Secondary | ICD-10-CM

## 2019-08-27 DIAGNOSIS — I2699 Other pulmonary embolism without acute cor pulmonale: Secondary | ICD-10-CM

## 2019-08-27 DIAGNOSIS — Z7901 Long term (current) use of anticoagulants: Secondary | ICD-10-CM

## 2019-08-27 DIAGNOSIS — K029 Dental caries, unspecified: Secondary | ICD-10-CM | POA: Insufficient documentation

## 2019-08-27 LAB — CBC WITH DIFFERENTIAL/PLATELET
Abs Immature Granulocytes: 0.01 K/uL (ref 0.00–0.07)
Basophils Absolute: 0 K/uL (ref 0.0–0.1)
Basophils Relative: 1 %
Eosinophils Absolute: 0.3 K/uL (ref 0.0–0.5)
Eosinophils Relative: 5 %
HCT: 39.9 % (ref 36.0–46.0)
Hemoglobin: 13.2 g/dL (ref 12.0–15.0)
Immature Granulocytes: 0 %
Lymphocytes Relative: 38 %
Lymphs Abs: 2.1 K/uL (ref 0.7–4.0)
MCH: 30 pg (ref 26.0–34.0)
MCHC: 33.1 g/dL (ref 30.0–36.0)
MCV: 90.7 fL (ref 80.0–100.0)
Monocytes Absolute: 0.4 K/uL (ref 0.1–1.0)
Monocytes Relative: 7 %
Neutro Abs: 2.7 K/uL (ref 1.7–7.7)
Neutrophils Relative %: 49 %
Platelets: 194 K/uL (ref 150–400)
RBC: 4.4 MIL/uL (ref 3.87–5.11)
RDW: 13.2 % (ref 11.5–15.5)
WBC: 5.4 K/uL (ref 4.0–10.5)
nRBC: 0 % (ref 0.0–0.2)

## 2019-08-27 LAB — COMPREHENSIVE METABOLIC PANEL WITH GFR
ALT: 13 U/L (ref 0–44)
AST: 22 U/L (ref 15–41)
Albumin: 3.8 g/dL (ref 3.5–5.0)
Alkaline Phosphatase: 81 U/L (ref 38–126)
Anion gap: 8 (ref 5–15)
BUN: 19 mg/dL (ref 8–23)
CO2: 28 mmol/L (ref 22–32)
Calcium: 8.9 mg/dL (ref 8.9–10.3)
Chloride: 104 mmol/L (ref 98–111)
Creatinine, Ser: 1.23 mg/dL — ABNORMAL HIGH (ref 0.44–1.00)
GFR calc Af Amer: 51 mL/min — ABNORMAL LOW (ref >60)
GFR calc non Af Amer: 44 mL/min — ABNORMAL LOW (ref >60)
Glucose, Bld: 154 mg/dL — ABNORMAL HIGH (ref 70–99)
Potassium: 4.1 mmol/L (ref 3.5–5.1)
Sodium: 140 mmol/L (ref 135–145)
Total Bilirubin: 0.9 mg/dL (ref 0.3–1.2)
Total Protein: 6.5 g/dL (ref 6.5–8.1)

## 2019-08-27 MED ORDER — APIXABAN 2.5 MG PO TABS: 2.5000 mg | ORAL_TABLET | Freq: Two times a day (BID) | ORAL | 1 refills | Status: DC

## 2019-08-27 NOTE — Progress Notes (Signed)
Patient brings in multiple questions today.

## 2019-08-28 NOTE — Progress Notes (Signed)
Hematology/Oncology  Follow up note [facility] [phone] [phone]   Patient Care Team: [name], MD as PCP - General (Family Medicine)  REFERRING PROVIDER: Dr.Chen CHIEF COMPLAINTS/REASON FOR VISIT:  Follow up for  PE  HISTORY OF PRESENTING ILLNESS:  Jenny Thomas is a  73 y.o.  female with PMH listed below who was referred to me for evaluation of PE management.  Present to emergency room on 11/23/2018 for evaluation progressive shortness of breath.   CT chest angiogram showed bilateral pulmonary emboli, right greater than left, involving segmental branches of all lobes of the right lung and the left lower lobe.  CT findings equivocal for right heart strain with strengthening after interval ventricular septum and RV/LV ratio of approximately 1. 2D echo showed left ventricular function 55 to 60%, wall motion was normal.  No regional wall motion abnormalities.  Moderate regurgitation.  Right ventricle mildly dilated.  Double thickness was normal.  Systolic function was low normal.  Right atrium was mildly dilated.  Tricuspid valve moderate regurgitation.  Pulmonary arteries systolic pressure was moderately elevated.  PA peak pressure 46 mm Hg No lower extremity DVT was found. Patient was started on IV heparin. Switch to Eliquis at discharge. Denies any proceeding immobilizing factors.     INTERVAL HISTORY Jenny Thomas is a 73 y.o. female who has above history reviewed by me today presents for follow up visit for management of unprovoked pulmonary embolism. Problems and complaints are listed below: Patient has finished 6 months of Eliquis 5 mg twice daily and he has been switched to Eliquis 2.5 mg twice daily since last visit. She reports breathing is fine.  No shortness of breath or chest pain. Denies calf swelling. Denies hematochezia, hematuria, hematemesis, epistaxis, black tarry stool or easy bruising.    Review of  Systems  Constitutional: Negative for appetite change, chills, fatigue and fever.  HENT:   Negative for hearing loss and voice change.   Eyes: Negative for eye problems.  Respiratory: Negative for chest tightness and cough.   Cardiovascular: Negative for chest pain.  Gastrointestinal: Negative for abdominal distention, abdominal pain and blood in stool.  Endocrine: Negative for hot flashes.  Genitourinary: Negative for difficulty urinating and frequency.   Musculoskeletal: Negative for arthralgias.  Skin: Negative for itching and rash.  Neurological: Negative for extremity weakness.  Hematological: Negative for adenopathy.  Psychiatric/Behavioral: Negative for confusion.    MEDICAL HISTORY:  Past Medical History:  Diagnosis Date  . Anxiety   . Asthma   . Back pain   . Constipation   . COPD (chronic obstructive pulmonary disease) ([place])   . Depression   . Dyspnea   . Gallbladder problem   . Hyperlipidemia   . Hypertension   . Hypothyroid   . Joint pain   . Leg edema   . Prediabetes     SURGICAL HISTORY: Past Surgical History:  Procedure Laterality Date  . ABDOMINAL HYSTERECTOMY    . CHOLECYSTECTOMY    . hernia repair    . LAPAROSCOPIC GASTRIC BANDING      SOCIAL HISTORY: Social History   Socioeconomic History  . Marital status: Single    Spouse name: Not on file  . Number of children: Not on file  . Years of education: Not on file  . Highest education level: Not on file  Occupational History  . Occupation: Retired  Scientific laboratory technician  . Financial resource strain: Not on file  . Food insecurity  Worry: Not on file    Inability: Not on file  . Transportation needs    Medical: Not on file    Non-medical: Not on file  Tobacco Use  . Smoking status: Never Smoker  . Smokeless tobacco: Never Used  Substance and Sexual Activity  . Alcohol use: Not on file  . Drug use: Not on file  . Sexual activity: Not on file  Lifestyle  . Physical activity    Days per  week: Not on file    Minutes per session: Not on file  . Stress: Not on file  Relationships  . Social Herbalist on phone: Not on file    Gets together: Not on file    Attends religious service: Not on file    Active member of club or organization: Not on file    Attends meetings of clubs or organizations: Not on file    Relationship status: Not on file  . Intimate partner violence    Fear of current or ex partner: Not on file    Emotionally abused: Not on file    Physically abused: Not on file    Forced sexual activity: Not on file  Other Topics Concern  . Not on file  Social History Narrative  . Not on file    FAMILY HISTORY: Family History  Problem Relation Age of Onset  . Breast cancer Maternal Aunt   . Hypertension Mother   . Kidney disease Mother   . Obesity Mother   . Stroke Father   . Heart disease Father   . Alcoholism Father     ALLERGIES:  is allergic to bee pollen and percocet [oxycodone-acetaminophen].  MEDICATIONS:  Current Outpatient Medications  Medication Sig Dispense Refill  . ACCU-CHEK FASTCLIX LANCETS MISC 1 each by Does not apply route 2 (two) times daily. 100 each 0  . albuterol (PROVENTIL HFA;VENTOLIN HFA) 108 (90 Base) MCG/ACT inhaler Inhale into the lungs every 6 (six) hours as needed for wheezing or shortness of breath.    [name] albuterol (PROVENTIL) (2.5 MG/3ML) 0.083% nebulizer solution Take 2.5 mg by nebulization every 6 (six) hours as needed for wheezing or shortness of breath.    [name] apixaban (ELIQUIS) 2.5 MG TABS tablet Take 1 tablet (2.5 mg total) by mouth 2 (two) times daily. 180 tablet 1  . benzonatate (TESSALON) 200 MG capsule TAKE 1 CAPSULE BY MOUTH THREE TIMES DAILY AS NEEDED FOR COUGH    . Blood Glucose Monitoring Suppl (ACCU-CHEK NANO SMARTVIEW) w/Device KIT 1 each by Does not apply route daily. Check blood sugar twice a day. 1 kit 0  . busPIRone (BUSPAR) 5 MG tablet Take 5 mg by mouth 2 (two) times daily.     . cetirizine  (ZYRTEC) 10 MG tablet Take 10 mg by mouth daily.    . chlorhexidine (PERIDEX) 0.12 % solution     . citalopram (CELEXA) 40 MG tablet Take 40 mg by mouth daily.    . Fluticasone-Salmeterol (ADVAIR) 250-50 MCG/DOSE AEPB Inhale 1 puff into the lungs 2 (two) times daily.    [name] levothyroxine (SYNTHROID, LEVOTHROID) 88 MCG tablet Take 88 mcg by mouth daily before breakfast.    . montelukast (SINGULAIR) 10 MG tablet Take 10 mg by mouth at bedtime.    . Multiple Vitamin (MULTI-VITAMIN) tablet Take by mouth.    . simvastatin (ZOCOR) 20 MG tablet Take 20 mg by mouth at bedtime.     [name] alendronate (FOSAMAX) 70 MG tablet Take 70  mg by mouth once a week. Take with a full glass of water on an empty stomach.    . ferrous sulfate 325 (65 FE) MG EC tablet Take 1 tablet (325 mg total) by mouth daily. (Patient not taking: Reported on 08/27/2019) 30 tablet 3  . Vitamin D, Ergocalciferol, (DRISDOL) 1.25 MG (50000 UT) CAPS capsule Take 1 capsule (50,000 Units total) by mouth every 7 (seven) days. (Patient not taking: Reported on 05/31/2019) 4 capsule 0   No current facility-administered medications for this visit.      PHYSICAL EXAMINATION: ECOG PERFORMANCE STATUS: 0 - Asymptomatic Vitals:   08/27/19 1321  BP: 134/78  Pulse: 82  Resp: 18  Temp: 99 F (37.2 C)   Filed Weights   08/27/19 1321  Weight: 233 lb 12.8 oz (106.1 kg)    Physical Exam Constitutional:      General: She is not in acute distress. HENT:     Head: Normocephalic and atraumatic.     Nose:     Comments: Seasonal allergy symptoms Eyes:     General: No scleral icterus.    Pupils: Pupils are equal, round, and reactive to light.  Neck:     Musculoskeletal: Normal range of motion and neck supple.  Cardiovascular:     Rate and Rhythm: Normal rate and regular rhythm.     Heart sounds: Normal heart sounds.  Pulmonary:     Effort: Pulmonary effort is normal. No respiratory distress.     Breath sounds: No wheezing.  Abdominal:      General: Bowel sounds are normal. There is no distension.     Palpations: Abdomen is soft. There is no mass.     Tenderness: There is no abdominal tenderness.  Musculoskeletal: Normal range of motion.        General: No deformity.  Skin:    General: Skin is warm and dry.     Findings: No erythema or rash.  Neurological:     Mental Status: She is alert and oriented to person, place, and time.     Cranial Nerves: No cranial nerve deficit.     Coordination: Coordination normal.  Psychiatric:        Behavior: Behavior normal.        Thought Content: Thought content normal.      LABORATORY DATA:  I have reviewed the data as listed Lab Results  Component Value Date   WBC 5.4 08/27/2019   HGB 13.2 08/27/2019   HCT 39.9 08/27/2019   MCV 90.7 08/27/2019   PLT 194 08/27/2019   Recent Labs    02/28/19 0825 05/30/19 1055 08/27/19 1303  NA 138 140 140  K 3.8 4.1 4.1  CL 106 106 104  CO2 26 26 28   GLUCOSE 112* 110* 154*  BUN 19 16 19   CREATININE 1.14* 1.22* 1.23*  CALCIUM 8.6* 8.7* 8.9  GFRNONAA 49* 45* 44*  GFRAA 56* 52* 51*  PROT 6.9 7.0 6.5  ALBUMIN 3.9 4.0 3.8  AST 22 23 22   ALT 14 15 13   ALKPHOS 84 86 81  BILITOT 0.5 0.7 0.9   Iron/TIBC/Ferritin/ %Sat    Component Value Date/Time   IRON 65 12/10/2018 1543   TIBC 422 12/10/2018 1543   FERRITIN 16 12/10/2018 1543   IRONPCTSAT 15 12/10/2018 1543        ASSESSMENT & PLAN:  1. Bilateral pulmonary embolism ([place])   2. Chronic anticoagulation   3. Stage 3 chronic kidney disease ([facility])    Unprovoked PE, recommend  long term anticoagulation.  Currently on chronic anticoagulation maintenance with Eliquis 2.5 mg twice daily. Tolerating well. Continue and refills sent to pharmacy. Patient has tested negative for factor V Leiden mutation and prothrombin mutation.  Discussed with patient.  Iron deficiency anemia, hemoglobin has completely resolved.  She takes over-the-counter iron supplementation. Recommend patient to  continue follow-up with gastroenterology.  Patient reports that she is having dental cavities and teeth need to be extracted.  Okay to stop Eliquis for 3 days prior to the procedure and resume. For elective colonoscopy screening, recommend patient to wait until December which was 1 year after previous PE and okay to hold Eliquis for 3 days prior to the procedure and to resume afterwards.  CKD, creatinine stable.  Orders Placed This Encounter  Procedures  . CBC with Differential/Platelet    Standing Status:   Future    Standing Expiration Date:   08/26/2020  . Comprehensive metabolic panel    Standing Status:   Future    Standing Expiration Date:   08/26/2020    All questions were answered. The patient knows to call the clinic with any problems questions or concerns.  Return of visit: 3 months with repeat labs and MD assessment. We spent sufficient time to discuss many aspect of care, questions were answered to patient's satisfaction. Total face to face encounter time for this patient visit was 25 min. >50% of the time was  spent in counseling and coordination of care.    Earlie Server, MD, PhD Hematology Oncology [facility] Pager- [phone] 08/28/2019

## 2019-09-02 ENCOUNTER — Other Ambulatory Visit: Payer: Self-pay

## 2019-09-02 MED ORDER — APIXABAN 2.5 MG PO TABS: 2.5000 mg | ORAL_TABLET | Freq: Two times a day (BID) | ORAL | 1 refills | Status: DC

## 2019-12-24 DIAGNOSIS — R05 Cough: Secondary | ICD-10-CM | POA: Diagnosis not present

## 2019-12-24 DIAGNOSIS — J4530 Mild persistent asthma, uncomplicated: Secondary | ICD-10-CM | POA: Diagnosis not present

## 2019-12-24 DIAGNOSIS — G479 Sleep disorder, unspecified: Secondary | ICD-10-CM | POA: Diagnosis not present

## 2019-12-24 DIAGNOSIS — J449 Chronic obstructive pulmonary disease, unspecified: Secondary | ICD-10-CM | POA: Diagnosis not present

## 2020-02-21 ENCOUNTER — Ambulatory Visit: Payer: Medicare HMO | Admitting: Oncology

## 2020-02-21 ENCOUNTER — Other Ambulatory Visit: Payer: Medicare HMO

## 2020-02-24 ENCOUNTER — Inpatient Hospital Stay: Payer: Medicare HMO | Attending: Oncology

## 2020-02-24 DIAGNOSIS — R2241 Localized swelling, mass and lump, right lower limb: Secondary | ICD-10-CM | POA: Insufficient documentation

## 2020-02-24 DIAGNOSIS — Z7901 Long term (current) use of anticoagulants: Secondary | ICD-10-CM | POA: Insufficient documentation

## 2020-02-24 DIAGNOSIS — Z86711 Personal history of pulmonary embolism: Secondary | ICD-10-CM | POA: Diagnosis not present

## 2020-02-24 DIAGNOSIS — I2699 Other pulmonary embolism without acute cor pulmonale: Secondary | ICD-10-CM

## 2020-02-24 LAB — CBC WITH DIFFERENTIAL/PLATELET
Abs Immature Granulocytes: 0.01 10*3/uL (ref 0.00–0.07)
Basophils Absolute: 0 10*3/uL (ref 0.0–0.1)
Basophils Relative: 1 %
Eosinophils Absolute: 0.3 10*3/uL (ref 0.0–0.5)
Eosinophils Relative: 5 %
HCT: 42.6 % (ref 36.0–46.0)
Hemoglobin: 14.1 g/dL (ref 12.0–15.0)
Immature Granulocytes: 0 %
Lymphocytes Relative: 37 %
Lymphs Abs: 1.9 10*3/uL (ref 0.7–4.0)
MCH: 30.3 pg (ref 26.0–34.0)
MCHC: 33.1 g/dL (ref 30.0–36.0)
MCV: 91.6 fL (ref 80.0–100.0)
Monocytes Absolute: 0.5 10*3/uL (ref 0.1–1.0)
Monocytes Relative: 10 %
Neutro Abs: 2.5 10*3/uL (ref 1.7–7.7)
Neutrophils Relative %: 47 %
Platelets: 213 10*3/uL (ref 150–400)
RBC: 4.65 MIL/uL (ref 3.87–5.11)
RDW: 13.3 % (ref 11.5–15.5)
WBC: 5.2 10*3/uL (ref 4.0–10.5)
nRBC: 0 % (ref 0.0–0.2)

## 2020-02-24 LAB — COMPREHENSIVE METABOLIC PANEL
ALT: 15 U/L (ref 0–44)
AST: 22 U/L (ref 15–41)
Albumin: 4 g/dL (ref 3.5–5.0)
Alkaline Phosphatase: 99 U/L (ref 38–126)
Anion gap: 6 (ref 5–15)
BUN: 19 mg/dL (ref 8–23)
CO2: 26 mmol/L (ref 22–32)
Calcium: 8.7 mg/dL — ABNORMAL LOW (ref 8.9–10.3)
Chloride: 105 mmol/L (ref 98–111)
Creatinine, Ser: 1.19 mg/dL — ABNORMAL HIGH (ref 0.44–1.00)
GFR calc Af Amer: 53 mL/min — ABNORMAL LOW (ref >60)
GFR calc non Af Amer: 46 mL/min — ABNORMAL LOW (ref >60)
Glucose, Bld: 107 mg/dL — ABNORMAL HIGH (ref 70–99)
Potassium: 4.1 mmol/L (ref 3.5–5.1)
Sodium: 137 mmol/L (ref 135–145)
Total Bilirubin: 0.8 mg/dL (ref 0.3–1.2)
Total Protein: 7.1 g/dL (ref 6.5–8.1)

## 2020-02-25 ENCOUNTER — Encounter: Payer: Self-pay | Admitting: Oncology

## 2020-02-25 ENCOUNTER — Inpatient Hospital Stay: Payer: Medicare HMO | Admitting: Oncology

## 2020-02-25 NOTE — Progress Notes (Signed)
Patient verified using two identifiers for virtual visit via telephone today.  Patient does not offer any problems today.  

## 2020-03-04 ENCOUNTER — Telehealth: Payer: Medicare HMO | Admitting: Oncology

## 2020-03-04 ENCOUNTER — Encounter: Payer: Self-pay | Admitting: Oncology

## 2020-03-04 ENCOUNTER — Inpatient Hospital Stay (HOSPITAL_BASED_OUTPATIENT_CLINIC_OR_DEPARTMENT_OTHER): Payer: Medicare HMO | Admitting: Oncology

## 2020-03-04 DIAGNOSIS — I2699 Other pulmonary embolism without acute cor pulmonale: Secondary | ICD-10-CM | POA: Diagnosis not present

## 2020-03-04 DIAGNOSIS — Z7901 Long term (current) use of anticoagulants: Secondary | ICD-10-CM | POA: Diagnosis not present

## 2020-03-04 DIAGNOSIS — R2241 Localized swelling, mass and lump, right lower limb: Secondary | ICD-10-CM

## 2020-03-04 MED ORDER — APIXABAN 2.5 MG PO TABS: 2.5000 mg | ORAL_TABLET | Freq: Two times a day (BID) | ORAL | 1 refills | Status: DC

## 2020-03-04 NOTE — Addendum Note (Signed)
Addended by: Earlie Server on: 03/04/2020 10:38 PM   Modules accepted: Orders

## 2020-03-04 NOTE — Progress Notes (Signed)
Patient contacted for Mychart visit. Chart updated. No concerns voiced.

## 2020-03-04 NOTE — Progress Notes (Signed)
HEMATOLOGY-ONCOLOGY TeleHEALTH VISIT PROGRESS NOTE  I connected with Jenny Thomas on 03/04/20 at  2:30 PM EDT by video enabled telemedicine visit and verified that I am speaking with the correct person using two identifiers. I discussed the limitations, risks, security and privacy concerns of performing an evaluation and management service by telemedicine and the availability of in-person appointments. I also discussed with the patient that there may be a patient responsible charge related to this service. The patient expressed understanding and agreed to proceed.   Other persons participating in the visit and their role in the encounter:  None  Patient's location: Home  Provider's location: office Chief Complaint: follow up for PE     INTERVAL HISTORY Jenny Thomas is a 74 y.o. female who has above history reviewed by me today presents for follow up visit for management of PE. Problems and complaints are listed below:  Patient reports chronic shortness of breath, she attributes to allergy, COPD. Also report right lower extremity swelling.  No calf tenderness.  Patient is on Eliquis 2.45m BID for anticoagulation maintenance.   Review of Systems  Constitutional: Negative for appetite change, chills, fatigue and fever.  HENT:   Negative for hearing loss and voice change.   Eyes: Negative for eye problems.  Respiratory: Positive for shortness of breath. Negative for chest tightness and cough.   Cardiovascular: Positive for leg swelling. Negative for chest pain.  Gastrointestinal: Negative for abdominal distention, abdominal pain and blood in stool.  Endocrine: Negative for hot flashes.  Genitourinary: Negative for difficulty urinating and frequency.   Musculoskeletal: Negative for arthralgias.  Skin: Negative for itching and rash.  Neurological: Negative for extremity weakness.  Hematological: Negative for adenopathy.  Psychiatric/Behavioral: Negative for confusion.    Past  Medical History:  Diagnosis Date  . Anxiety   . Asthma   . Back pain   . Constipation   . COPD (chronic obstructive pulmonary disease) ([place]   . Depression   . Dyspnea   . Gallbladder problem   . Hyperlipidemia   . Hypertension   . Hypothyroid   . Joint pain   . Leg edema   . Prediabetes    Past Surgical History:  Procedure Laterality Date  . ABDOMINAL HYSTERECTOMY    . CHOLECYSTECTOMY    . hernia repair    . LAPAROSCOPIC GASTRIC BANDING      Family History  Problem Relation Age of Onset  . Breast cancer Maternal Aunt   . Hypertension Mother   . Kidney disease Mother   . Obesity Mother   . Stroke Father   . Heart disease Father   . Alcoholism Father     Social History   Socioeconomic History  . Marital status: Single    Spouse name: Not on file  . Number of children: Not on file  . Years of education: Not on file  . Highest education level: Not on file  Occupational History  . Occupation: Retired  Tobacco Use  . Smoking status: Never Smoker  . Smokeless tobacco: Never Used  Substance and Sexual Activity  . Alcohol use: Not on file  . Drug use: Not on file  . Sexual activity: Not on file  Other Topics Concern  . Not on file  Social History Narrative  . Not on file   Social Determinants of Health   Financial Resource Strain:   . Difficulty of Paying Living Expenses:   Food Insecurity:   . Worried About RCharity fundraiser  in the Last Year:   . [place] in the Last Year:   Transportation Needs:   . Film/video editor (Medical):   [name] Lack of Transportation (Non-Medical):   Physical Activity:   . Days of Exercise per Week:   . Minutes of Exercise per Session:   Stress:   . Feeling of Stress :   Social Connections:   . Frequency of Communication with Friends and Family:   . Frequency of Social Gatherings with Friends and Family:   . Attends Religious Services:   . Active Member of Clubs or Organizations:   . Attends Archivist  Meetings:   [name] Marital Status:   Intimate Partner Violence:   . Fear of Current or Ex-Partner:   . Emotionally Abused:   [name] Physically Abused:   . Sexually Abused:     Current Outpatient Medications on File Prior to Visit  Medication Sig Dispense Refill  . ACCU-CHEK FASTCLIX LANCETS MISC 1 each by Does not apply route 2 (two) times daily. 100 each 0  . albuterol (PROVENTIL HFA;VENTOLIN HFA) 108 (90 Base) MCG/ACT inhaler Inhale into the lungs every 6 (six) hours as needed for wheezing or shortness of breath.    [name] albuterol (PROVENTIL) (2.5 MG/3ML) 0.083% nebulizer solution Take 2.5 mg by nebulization every 6 (six) hours as needed for wheezing or shortness of breath.    [name] alendronate (FOSAMAX) 70 MG tablet Take 70 mg by mouth once a week. Take with a full glass of water on an empty stomach.    [name] apixaban (ELIQUIS) 2.5 MG TABS tablet Take 1 tablet (2.5 mg total) by mouth 2 (two) times daily. 180 tablet 1  . benzonatate (TESSALON) 200 MG capsule TAKE 1 CAPSULE BY MOUTH THREE TIMES DAILY AS NEEDED FOR COUGH    . Blood Glucose Monitoring Suppl (ACCU-CHEK NANO SMARTVIEW) w/Device KIT 1 each by Does not apply route daily. Check blood sugar twice a day. 1 kit 0  . busPIRone (BUSPAR) 5 MG tablet Take 5 mg by mouth 2 (two) times daily.     . Calcium Carbonate-Vitamin D 600-400 MG-UNIT tablet Take by mouth.    . cetirizine (ZYRTEC) 10 MG tablet Take 10 mg by mouth daily.    . chlorhexidine (PERIDEX) 0.12 % solution     . cholecalciferol (VITAMIN D3) 25 MCG (1000 UNIT) tablet Take 1,000 Units by mouth daily.    . citalopram (CELEXA) 40 MG tablet Take 40 mg by mouth daily.    . ferrous sulfate 325 (65 FE) MG EC tablet Take 1 tablet (325 mg total) by mouth daily. (Patient taking differently: Take 325 mg by mouth daily. OTC) 30 tablet 3  . Fluticasone-Salmeterol (ADVAIR) 250-50 MCG/DOSE AEPB Inhale 1 puff into the lungs 2 (two) times daily.    [name] levothyroxine (SYNTHROID, LEVOTHROID) 88 MCG tablet Take 88 mcg  by mouth daily before breakfast.    . montelukast (SINGULAIR) 10 MG tablet Take 10 mg by mouth at bedtime.    . Multiple Vitamin (MULTI-VITAMIN) tablet Take by mouth.    . simvastatin (ZOCOR) 20 MG tablet Take 20 mg by mouth at bedtime.     . Vitamin D, Ergocalciferol, (DRISDOL) 1.25 MG (50000 UT) CAPS capsule Take 1 capsule (50,000 Units total) by mouth every 7 (seven) days. (Patient not taking: Reported on 05/31/2019) 4 capsule 0   No current facility-administered medications on file prior to visit.    Allergies  Allergen Reactions  . Bee Pollen Anaphylaxis  Bee stings & bee pollen- anaphylaxis  . Percocet [Oxycodone-Acetaminophen] Nausea And Vomiting       Observations/Objective: There were no vitals filed for this visit. There is no height or weight on file to calculate BMI.  Physical Exam  Constitutional: No distress.  Neurological: She is alert.    CBC    Component Value Date/Time   WBC 5.2 02/24/2020 1117   RBC 4.65 02/24/2020 1117   HGB 14.1 02/24/2020 1117   HGB 13.9 05/31/2017 1158   HCT 42.6 02/24/2020 1117   HCT 41.0 05/31/2017 1158   PLT 213 02/24/2020 1117   MCV 91.6 02/24/2020 1117   MCV 90 05/31/2017 1158   MCH 30.3 02/24/2020 1117   MCHC 33.1 02/24/2020 1117   RDW 13.3 02/24/2020 1117   RDW 15.0 05/31/2017 1158   LYMPHSABS 1.9 02/24/2020 1117   LYMPHSABS 1.8 05/31/2017 1158   MONOABS 0.5 02/24/2020 1117   EOSABS 0.3 02/24/2020 1117   EOSABS 0.1 05/31/2017 1158   BASOSABS 0.0 02/24/2020 1117   BASOSABS 0.0 05/31/2017 1158    CMP     Component Value Date/Time   NA 137 02/24/2020 1117   NA 140 02/01/2018 0000   K 4.1 02/24/2020 1117   CL 105 02/24/2020 1117   CO2 26 02/24/2020 1117   GLUCOSE 107 (H) 02/24/2020 1117   BUN 19 02/24/2020 1117   BUN 20 02/01/2018 0000   CREATININE 1.19 (H) 02/24/2020 1117   CALCIUM 8.7 (L) 02/24/2020 1117   PROT 7.1 02/24/2020 1117   PROT 6.7 09/25/2017 1135   ALBUMIN 4.0 02/24/2020 1117   ALBUMIN 4.5  09/25/2017 1135   AST 22 02/24/2020 1117   ALT 15 02/24/2020 1117   ALKPHOS 99 02/24/2020 1117   BILITOT 0.8 02/24/2020 1117   BILITOT 0.6 09/25/2017 1135   GFRNONAA 46 (L) 02/24/2020 1117   GFRAA 53 (L) 02/24/2020 1117     Assessment and Plan: 1. Localized swelling of right lower extremity   2. Bilateral pulmonary embolism ([place])   3. Chronic anticoagulation     # History of PE on anticoagulation with Eliquis 2.71m BID.  Continue.  # Right lower extremity swelling, obtain right lower extremity ultrasound for evaluation.    Follow Up Instructions: 4 months.    I discussed the assessment and treatment plan with the patient. The patient was provided an opportunity to ask questions and all were answered. The patient agreed with the plan and demonstrated an understanding of the instructions.  The patient was advised to call back or seek an in-person evaluation if the symptoms worsen or if the condition fails to improve as anticipated.    ZEarlie Server MD 03/04/2020 10:26 PM

## 2020-03-06 ENCOUNTER — Ambulatory Visit
Admission: RE | Admit: 2020-03-06 | Discharge: 2020-03-06 | Disposition: A | Discharge status: Home / Self Care | Payer: Medicare HMO | Source: Ambulatory Visit | Attending: Oncology | Admitting: Oncology

## 2020-03-06 ENCOUNTER — Telehealth: Payer: Self-pay | Admitting: *Deleted

## 2020-03-06 ENCOUNTER — Other Ambulatory Visit: Payer: Self-pay

## 2020-03-06 DIAGNOSIS — R2241 Localized swelling, mass and lump, right lower limb: Secondary | ICD-10-CM | POA: Insufficient documentation

## 2020-03-06 DIAGNOSIS — M7989 Other specified soft tissue disorders: Secondary | ICD-10-CM | POA: Diagnosis not present

## 2020-03-06 NOTE — Telephone Encounter (Signed)
Called report. Patient aware of results and sent home. IMPRESSION: No evidence of acute or chronic DVT within either lower extremity.   Electronically Signed   By: Sandi Mariscal M.D.   On: 11/24/2018 11:21

## 2020-03-19 ENCOUNTER — Other Ambulatory Visit: Payer: Self-pay

## 2020-03-19 ENCOUNTER — Emergency Department: Payer: Medicare HMO

## 2020-03-19 ENCOUNTER — Encounter: Payer: Self-pay | Admitting: Emergency Medicine

## 2020-03-19 ENCOUNTER — Emergency Department
Admission: EM | Admit: 2020-03-19 | Discharge: 2020-03-19 | Disposition: A | Discharge status: Home / Self Care | Payer: Medicare HMO | Attending: Student | Admitting: Student

## 2020-03-19 DIAGNOSIS — I672 Cerebral atherosclerosis: Secondary | ICD-10-CM | POA: Diagnosis not present

## 2020-03-19 DIAGNOSIS — E039 Hypothyroidism, unspecified: Secondary | ICD-10-CM | POA: Diagnosis not present

## 2020-03-19 DIAGNOSIS — I10 Essential (primary) hypertension: Secondary | ICD-10-CM | POA: Insufficient documentation

## 2020-03-19 DIAGNOSIS — Y9389 Activity, other specified: Secondary | ICD-10-CM | POA: Insufficient documentation

## 2020-03-19 DIAGNOSIS — S161XXA Strain of muscle, fascia and tendon at neck level, initial encounter: Secondary | ICD-10-CM | POA: Diagnosis not present

## 2020-03-19 DIAGNOSIS — Y92410 Unspecified street and highway as the place of occurrence of the external cause: Secondary | ICD-10-CM | POA: Diagnosis not present

## 2020-03-19 DIAGNOSIS — Z79899 Other long term (current) drug therapy: Secondary | ICD-10-CM | POA: Insufficient documentation

## 2020-03-19 DIAGNOSIS — Y999 Unspecified external cause status: Secondary | ICD-10-CM | POA: Insufficient documentation

## 2020-03-19 DIAGNOSIS — S3992XA Unspecified injury of lower back, initial encounter: Secondary | ICD-10-CM | POA: Diagnosis not present

## 2020-03-19 DIAGNOSIS — E119 Type 2 diabetes mellitus without complications: Secondary | ICD-10-CM | POA: Insufficient documentation

## 2020-03-19 DIAGNOSIS — J449 Chronic obstructive pulmonary disease, unspecified: Secondary | ICD-10-CM | POA: Diagnosis not present

## 2020-03-19 DIAGNOSIS — M47812 Spondylosis without myelopathy or radiculopathy, cervical region: Secondary | ICD-10-CM | POA: Diagnosis not present

## 2020-03-19 DIAGNOSIS — R519 Headache, unspecified: Secondary | ICD-10-CM | POA: Diagnosis present

## 2020-03-19 MED ORDER — METHOCARBAMOL 500 MG PO TABS: 500.0000 mg | ORAL_TABLET | Freq: Four times a day (QID) | ORAL | 0 refills | Status: AC

## 2020-03-19 MED ORDER — PREDNISONE 50 MG PO TABS: 50.0000 mg | ORAL_TABLET | Freq: Every day | ORAL | 0 refills | Status: DC
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
## 2020-04-02 DIAGNOSIS — E669 Obesity, unspecified: Secondary | ICD-10-CM | POA: Diagnosis not present

## 2020-04-02 DIAGNOSIS — Z0000 Encounter for general adult medical examination without abnormal findings: Secondary | ICD-10-CM | POA: Diagnosis not present

## 2020-04-02 DIAGNOSIS — E7800 Pure hypercholesterolemia, unspecified: Secondary | ICD-10-CM | POA: Diagnosis not present

## 2020-04-02 DIAGNOSIS — Z6841 Body Mass Index (BMI) 40.0 and over, adult: Secondary | ICD-10-CM | POA: Diagnosis not present

## 2020-04-02 DIAGNOSIS — H9190 Unspecified hearing loss, unspecified ear: Secondary | ICD-10-CM | POA: Diagnosis not present

## 2020-04-02 DIAGNOSIS — N1831 Chronic kidney disease, stage 3a: Secondary | ICD-10-CM | POA: Diagnosis not present

## 2020-04-15 DIAGNOSIS — M79601 Pain in right arm: Secondary | ICD-10-CM | POA: Diagnosis not present

## 2020-04-29 DIAGNOSIS — H5212 Myopia, left eye: Secondary | ICD-10-CM | POA: Diagnosis not present

## 2020-05-18 ENCOUNTER — Other Ambulatory Visit: Payer: Self-pay | Admitting: Family Medicine

## 2020-05-18 ENCOUNTER — Other Ambulatory Visit: Payer: Self-pay | Admitting: Oncology

## 2020-05-18 DIAGNOSIS — Z1231 Encounter for screening mammogram for malignant neoplasm of breast: Secondary | ICD-10-CM

## 2020-05-19 DIAGNOSIS — K219 Gastro-esophageal reflux disease without esophagitis: Secondary | ICD-10-CM | POA: Diagnosis not present

## 2020-05-19 DIAGNOSIS — G4733 Obstructive sleep apnea (adult) (pediatric): Secondary | ICD-10-CM | POA: Diagnosis not present

## 2020-05-19 DIAGNOSIS — J4520 Mild intermittent asthma, uncomplicated: Secondary | ICD-10-CM | POA: Diagnosis not present

## 2020-05-19 DIAGNOSIS — R0600 Dyspnea, unspecified: Secondary | ICD-10-CM | POA: Diagnosis not present

## 2020-05-20 ENCOUNTER — Ambulatory Visit
Admission: RE | Admit: 2020-05-20 | Discharge: 2020-05-20 | Disposition: A | Discharge status: Home / Self Care | Payer: Medicare HMO | Source: Ambulatory Visit | Attending: Family Medicine | Admitting: Family Medicine

## 2020-05-20 DIAGNOSIS — Z1231 Encounter for screening mammogram for malignant neoplasm of breast: Secondary | ICD-10-CM

## 2020-05-28 DIAGNOSIS — B3789 Other sites of candidiasis: Secondary | ICD-10-CM | POA: Diagnosis not present

## 2020-06-21 IMAGING — CT CT ANGIO CHEST
2 of 6 series · 17 of 46 positions shown · IV contrast (APPLIED)
Comparison: None.

CLINICAL DATA: Chest pain and shortness of breath.

EXAM:
CT ANGIOGRAPHY CHEST WITH CONTRAST
TECHNIQUE: Multidetector CT imaging of the chest was performed using the
standard protocol during bolus administration of intravenous
contrast. Multiplanar CT image reconstructions and MIPs were
obtained to evaluate the vascular anatomy.
CONTRAST:  60mL OMNIPAQUE IOHEXOL 350 MG/ML SOLN

[Series 5: thins · axial · 0.66mm/px · z∈[-684,-489]mm · 14 of 215 slices shown]
[im 10/215  lung]
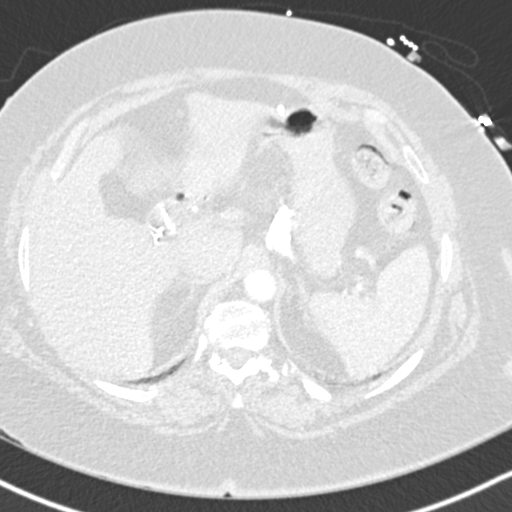
[im 28/215  soft-tissue]
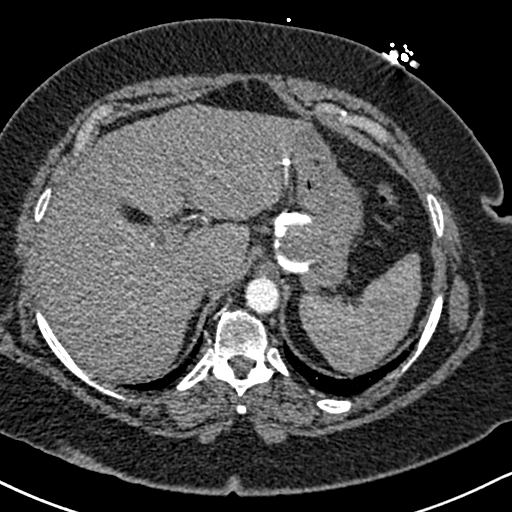
[im 38/215  lung]
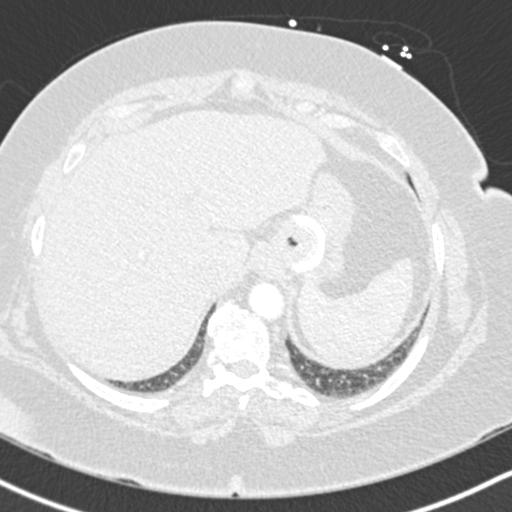
[im 56/215  soft-tissue]
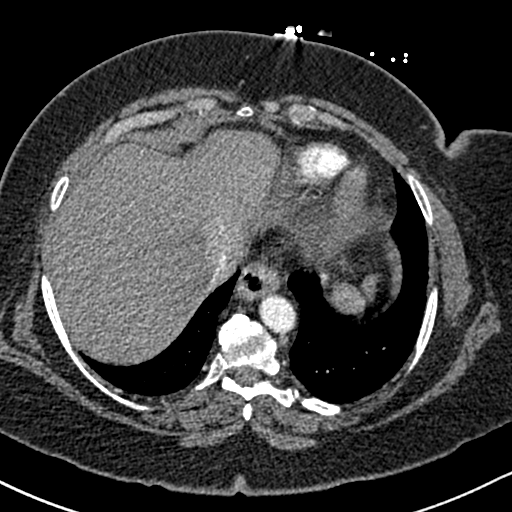
[im 75/215  lung]
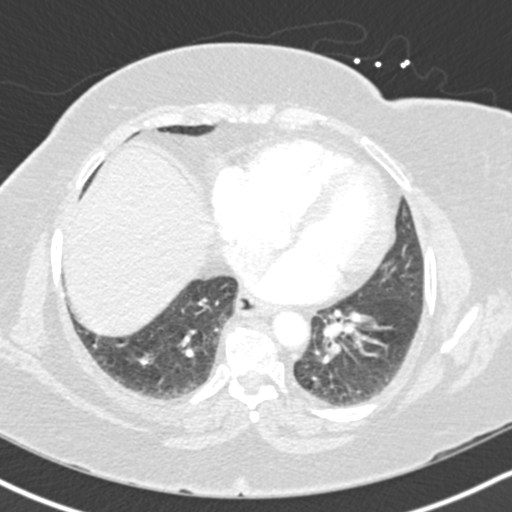
[im 84/215  soft-tissue]
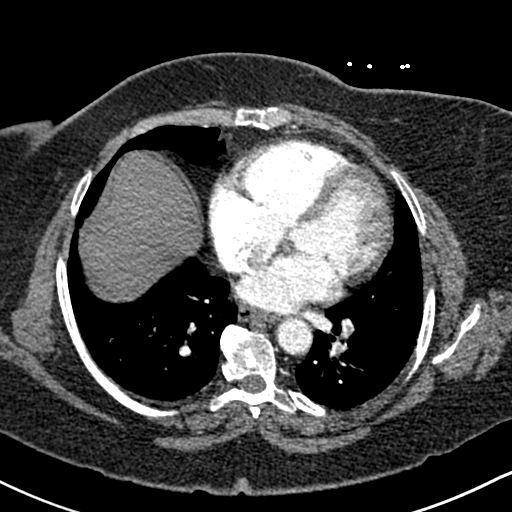
[im 103/215  lung]
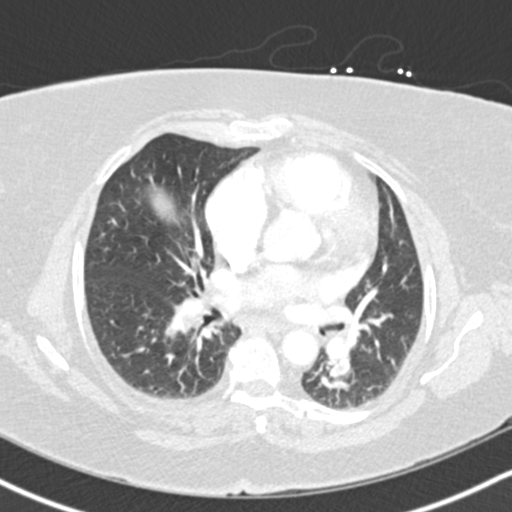
[im 112/215  soft-tissue]
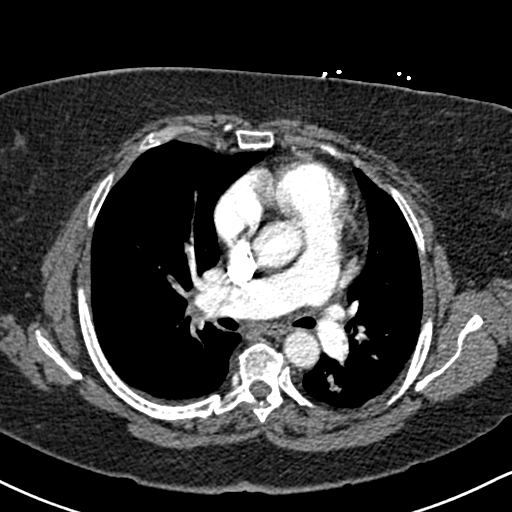
[im 131/215  lung]
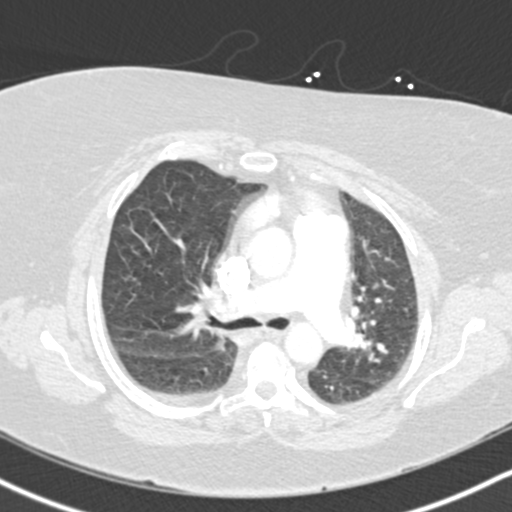
[im 140/215  soft-tissue]
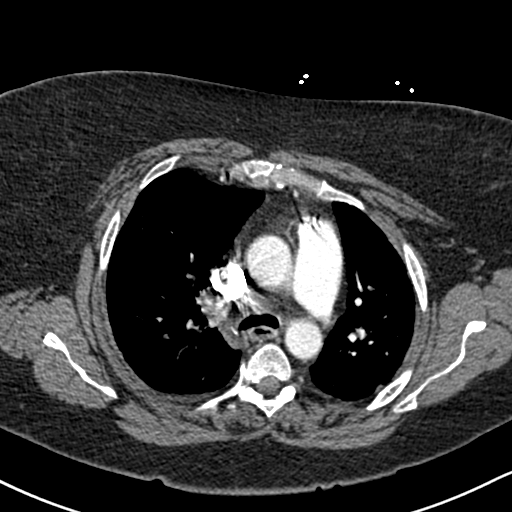
[im 159/215  lung]
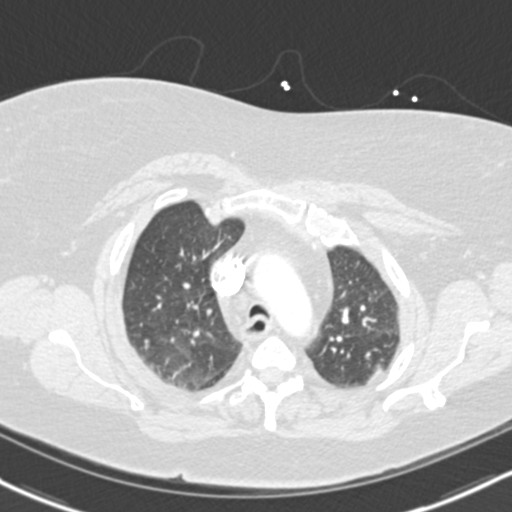
[im 177/215  soft-tissue]
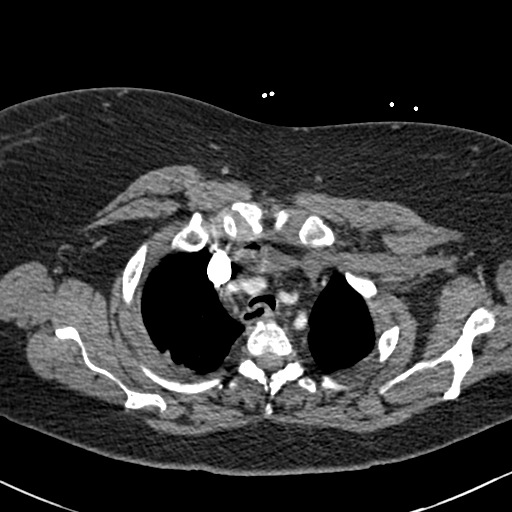
[im 187/215  lung]
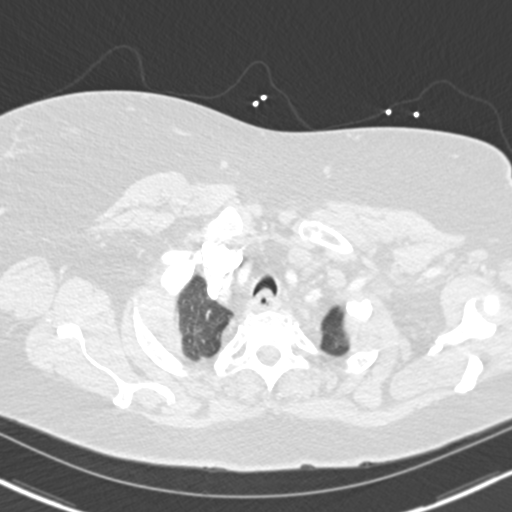
[im 205/215  soft-tissue]
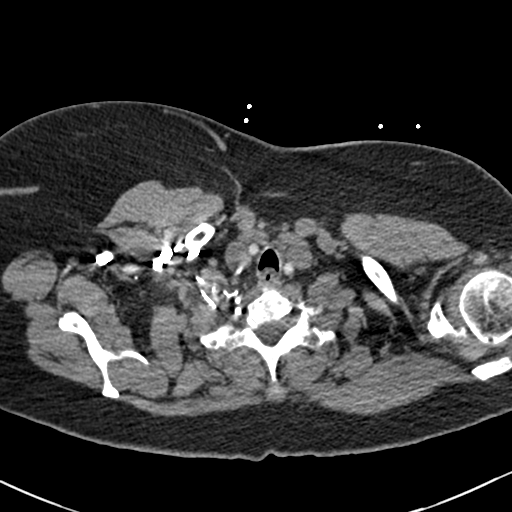

[Series 7: coronal mpr · coronal · 0.49mm/px · 3 of 92 slices shown]
[im 23/92  soft-tissue]
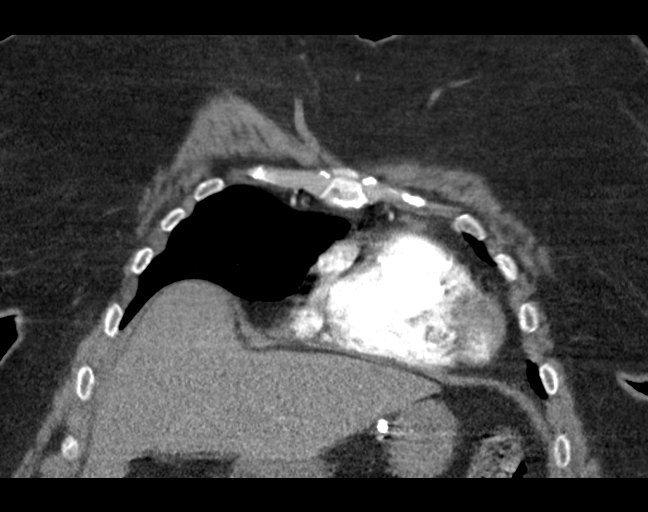
[im 46/92  soft-tissue]
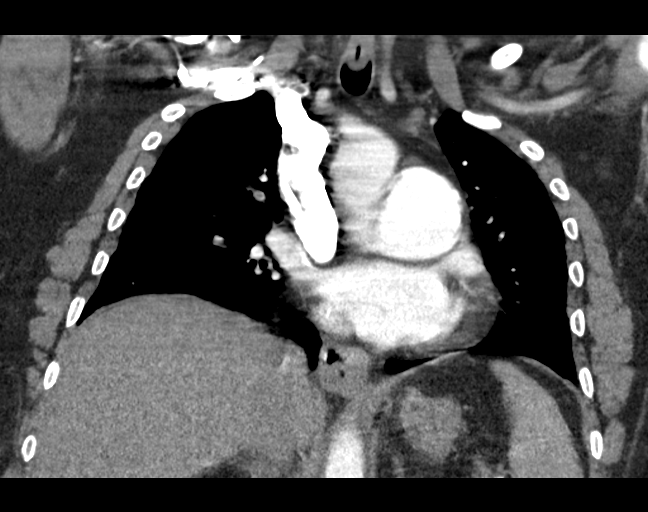
[im 69/92  soft-tissue]
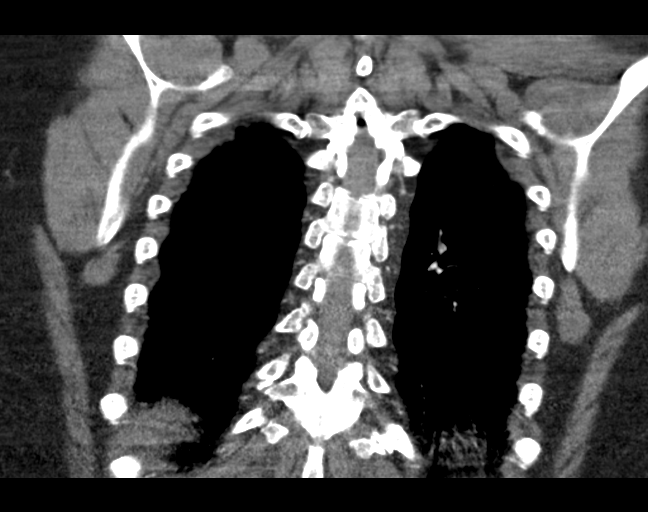

[17 of 46 positions shown; findings below may reference images not displayed]

FINDINGS: Cardiovascular:

--Pulmonary arteries: Contrast injection is sufficient to
demonstrate satisfactory opacification of the pulmonary arteries to
the segmental level.There are bilateral pulmonary emboli,
predominantly on the right side. There are filling defects within
the right upper lobe pulmonary artery, extending into the segmental
branches; within the proximal right lower lobe segmental branches
and within the right middle lobe segmental branches. On the left,
there are filling defects within the segmental branches of the left
lower lobe. There is straightening of the interventricular septum
with a RV:LV ratio of approximately 1. The main pulmonary artery is
within normal limits for size.

--Aorta: Satisfactory opacification of the thoracic aorta. No aortic
dissection or other acute aortic syndrome. Conventional 3 vessel
aortic branching pattern. The aortic course and caliber are normal.
There is no aortic atherosclerosis.

--Heart: Normal size. No pericardial effusion.

Mediastinum/Nodes: No mediastinal, hilar or axillary
lymphadenopathy. The visualized thyroid and thoracic esophageal
course are unremarkable.

Lungs/Pleura: No pulmonary nodules or masses. No pleural effusion or
pneumothorax. No focal airspace consolidation. No focal pleural
abnormality.

Upper Abdomen: Contrast bolus timing is not optimized for evaluation
of the abdominal organs. Within this limitation, the visualized
organs of the upper abdomen are normal.

Musculoskeletal: No chest wall abnormality. No acute or significant
osseous findings.

Review of the MIP images confirms the above findings.
IMPRESSION: 1. Bilateral pulmonary emboli, right greater than left, involving
segmental branches of all lobes of the right lung and the left lower
lobe.
2. CT findings equivocal for right heart strain with straightening
of the interventricular septum and RV:LV ratio of approximately 1.0.

Critical Value/emergent results were called by telephone at the time
of interpretation on 11/23/2018 at [DATE] to Dr. RUHINDWA, who
verbally acknowledged these results.

## 2020-06-22 IMAGING — US US EXTREM LOW VENOUS BILAT
1 series · 13 of 24 positions shown · non-contrast
Comparison: Chest CTA-11/23/2018; left lower extremity venous
Doppler ultrasound-07/12/2018

CLINICAL DATA: Pulmonary embolism. History of prior DVT. Evaluate
for acute or chronic DVT.



[Series 1: us extrem low venous bilat · 13 of 64 slices shown]
[im 1/64]
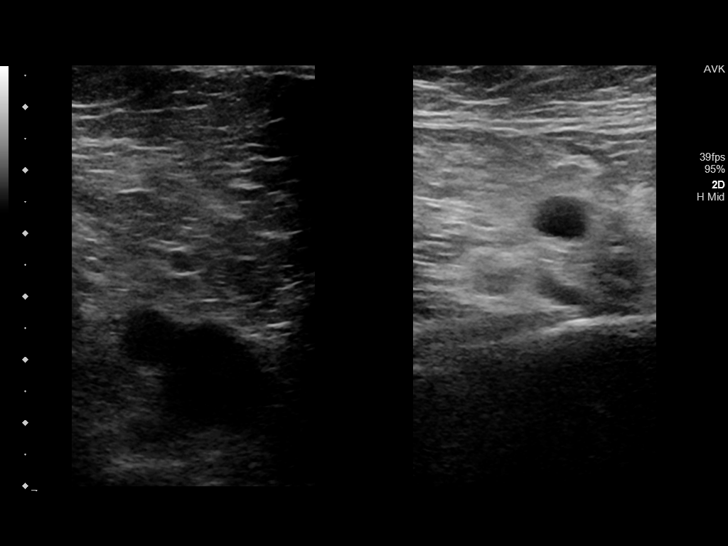
[im 6/64]
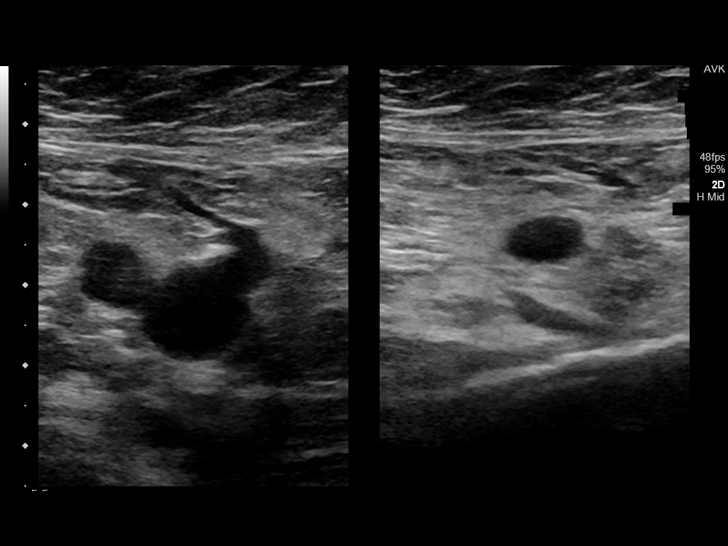
[im 11/64]
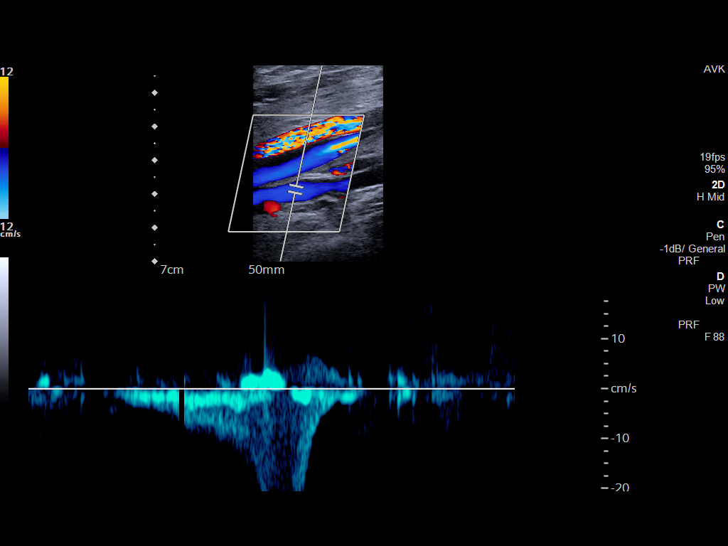
[im 17/64]
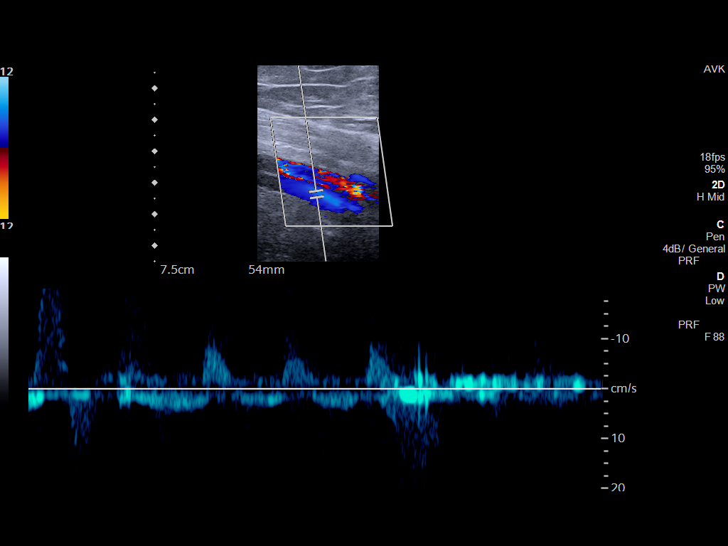
[im 22/64]
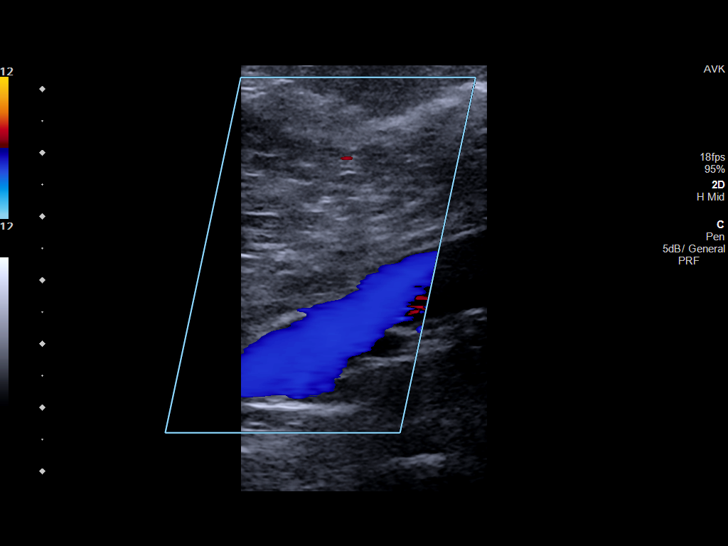
[im 28/64]
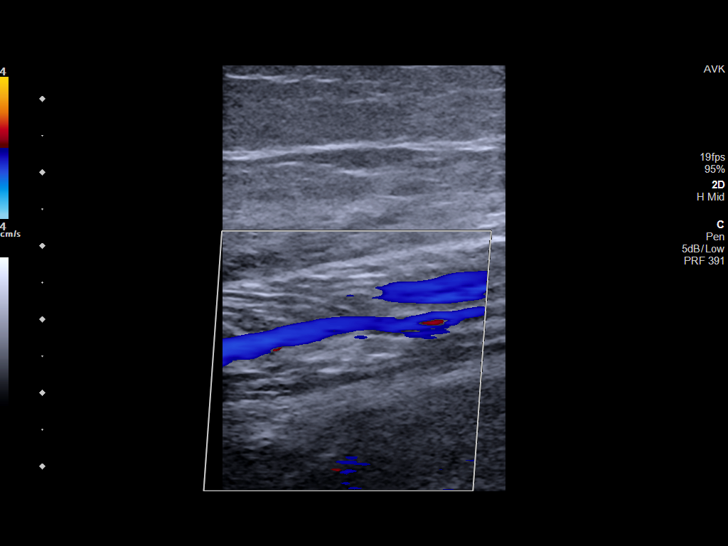
[im 33/64]
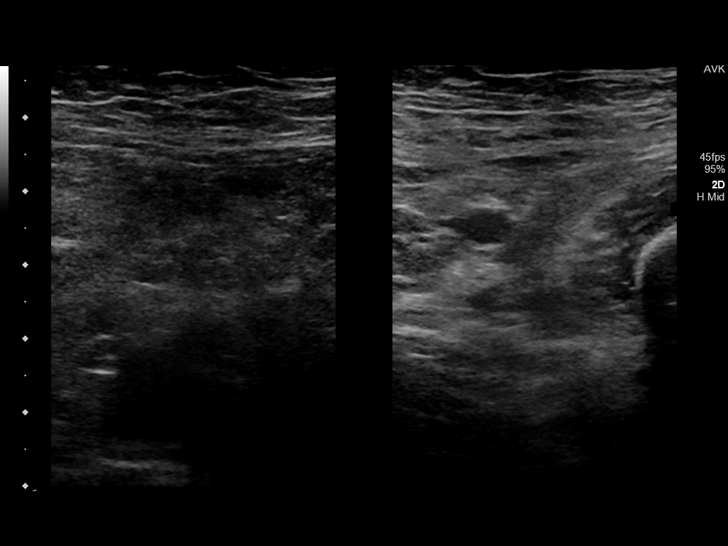
[im 36/64]
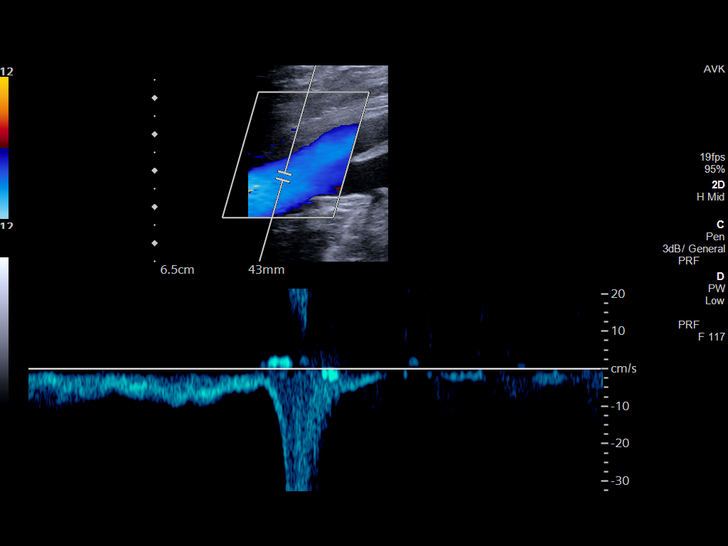
[im 42/64]
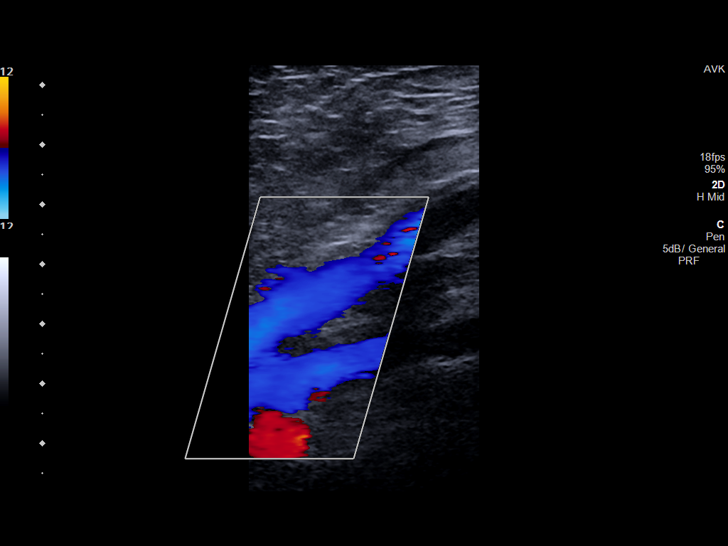
[im 47/64]
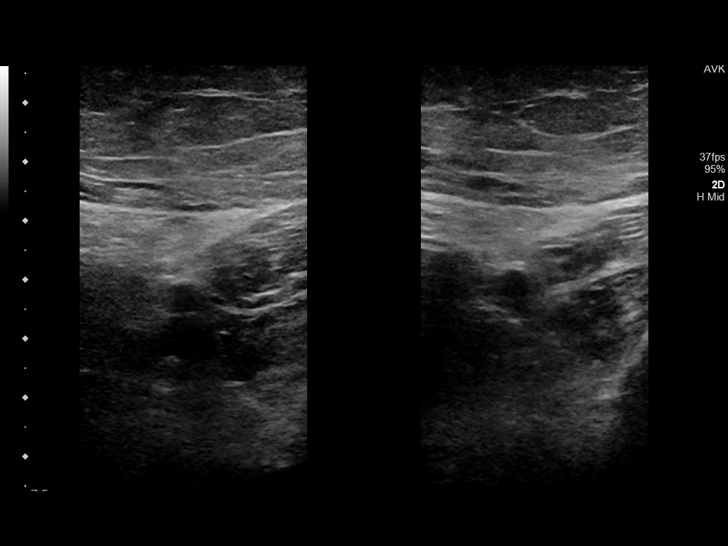
[im 53/64]
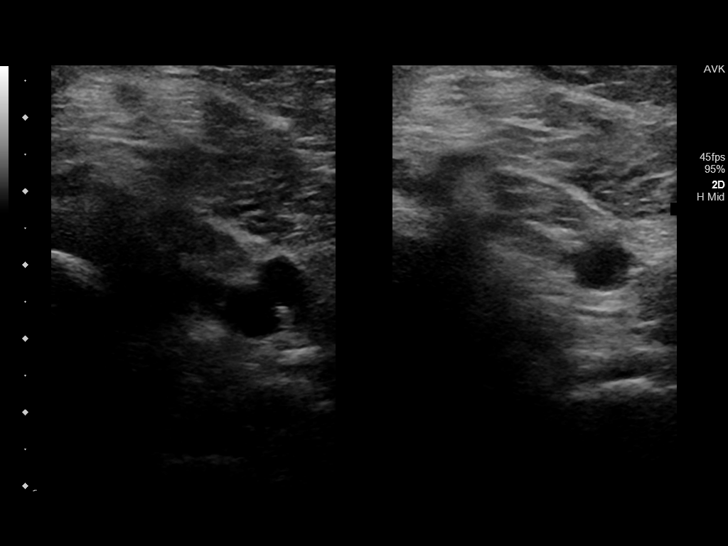
[im 58/64]
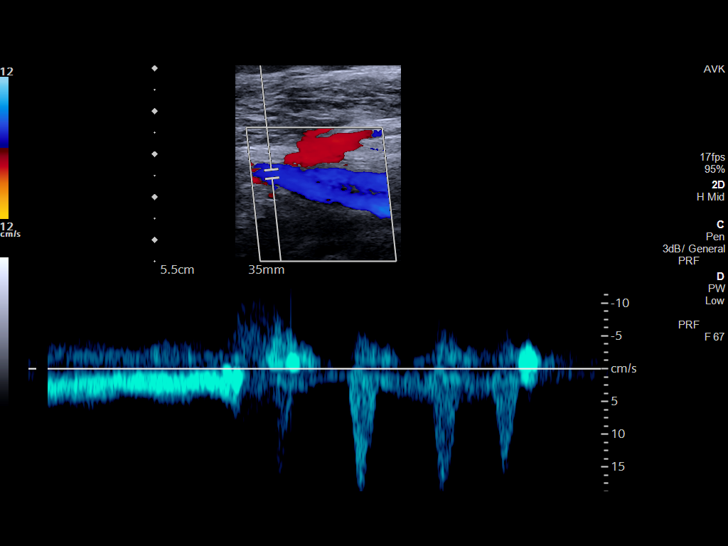
[im 64/64]
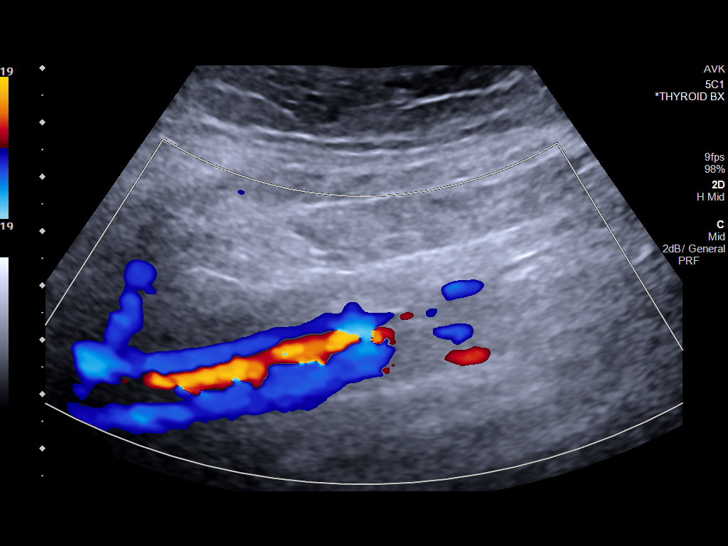

[13 of 24 positions shown; findings below may reference images not displayed]

FINDINGS: RIGHT LOWER EXTREMITY

Common Femoral Vein: No evidence of thrombus. Normal
compressibility, respiratory phasicity and response to augmentation.

Saphenofemoral Junction: No evidence of thrombus. Normal
compressibility and flow on color Doppler imaging.

Profunda Femoral Vein: No evidence of thrombus. Normal
compressibility and flow on color Doppler imaging.

Femoral Vein: No evidence of thrombus. Normal compressibility,
respiratory phasicity and response to augmentation.

Popliteal Vein: No evidence of thrombus. Normal compressibility,
respiratory phasicity and response to augmentation.

Calf Veins: No evidence of thrombus. Normal compressibility and flow
on color Doppler imaging.

Superficial Great Saphenous Vein: No evidence of thrombus. Normal
compressibility.

Venous Reflux:  None.

Other Findings:  None.

LEFT LOWER EXTREMITY

Common Femoral Vein: No evidence of thrombus. Normal
compressibility, respiratory phasicity and response to augmentation.

Saphenofemoral Junction: No evidence of thrombus. Normal
compressibility and flow on color Doppler imaging.

Profunda Femoral Vein: No evidence of thrombus. Normal
compressibility and flow on color Doppler imaging.

Femoral Vein: No evidence of thrombus. Normal compressibility,
respiratory phasicity and response to augmentation.

Popliteal Vein: No evidence of thrombus. Normal compressibility,
respiratory phasicity and response to augmentation.

Calf Veins: No evidence of thrombus. Normal compressibility and flow
on color Doppler imaging.

Superficial Great Saphenous Vein: No evidence of thrombus. Normal
compressibility.

Venous Reflux:  None.

Other Findings:  None.
IMPRESSION: No evidence of acute or chronic DVT within either lower extremity.

## 2020-07-03 ENCOUNTER — Encounter: Payer: Self-pay | Admitting: Oncology

## 2020-07-03 ENCOUNTER — Inpatient Hospital Stay: Payer: Medicare HMO | Admitting: Oncology

## 2020-07-03 ENCOUNTER — Other Ambulatory Visit: Payer: Self-pay

## 2020-07-03 ENCOUNTER — Inpatient Hospital Stay: Payer: Medicare HMO | Attending: Oncology

## 2020-07-03 VITALS — BP 141/77 | HR 71 | Temp 97.4°F | Resp 18 | Ht 59.0 in | Wt 236.8 lb

## 2020-07-03 DIAGNOSIS — Z7901 Long term (current) use of anticoagulants: Secondary | ICD-10-CM | POA: Diagnosis not present

## 2020-07-03 DIAGNOSIS — N189 Chronic kidney disease, unspecified: Secondary | ICD-10-CM | POA: Insufficient documentation

## 2020-07-03 DIAGNOSIS — I2699 Other pulmonary embolism without acute cor pulmonale: Secondary | ICD-10-CM | POA: Diagnosis not present

## 2020-07-03 LAB — COMPREHENSIVE METABOLIC PANEL
ALT: 14 U/L (ref 0–44)
AST: 23 U/L (ref 15–41)
Albumin: 4.3 g/dL (ref 3.5–5.0)
Alkaline Phosphatase: 88 U/L (ref 38–126)
Anion gap: 11 (ref 5–15)
BUN: 16 mg/dL (ref 8–23)
CO2: 22 mmol/L (ref 22–32)
Calcium: 8.9 mg/dL (ref 8.9–10.3)
Chloride: 106 mmol/L (ref 98–111)
Creatinine, Ser: 1.25 mg/dL — ABNORMAL HIGH (ref 0.44–1.00)
GFR calc Af Amer: 50 mL/min — ABNORMAL LOW (ref >60)
GFR calc non Af Amer: 43 mL/min — ABNORMAL LOW (ref >60)
Glucose, Bld: 111 mg/dL — ABNORMAL HIGH (ref 70–99)
Potassium: 3.9 mmol/L (ref 3.5–5.1)
Sodium: 139 mmol/L (ref 135–145)
Total Bilirubin: 1 mg/dL (ref 0.3–1.2)
Total Protein: 7.5 g/dL (ref 6.5–8.1)

## 2020-07-03 LAB — CBC WITH DIFFERENTIAL/PLATELET
Abs Immature Granulocytes: 0.02 10*3/uL (ref 0.00–0.07)
Basophils Absolute: 0 10*3/uL (ref 0.0–0.1)
Basophils Relative: 0 %
Eosinophils Absolute: 0.1 10*3/uL (ref 0.0–0.5)
Eosinophils Relative: 3 %
HCT: 43.2 % (ref 36.0–46.0)
Hemoglobin: 14.8 g/dL (ref 12.0–15.0)
Immature Granulocytes: 0 %
Lymphocytes Relative: 33 %
Lymphs Abs: 1.6 10*3/uL (ref 0.7–4.0)
MCH: 30.4 pg (ref 26.0–34.0)
MCHC: 34.3 g/dL (ref 30.0–36.0)
MCV: 88.7 fL (ref 80.0–100.0)
Monocytes Absolute: 0.5 10*3/uL (ref 0.1–1.0)
Monocytes Relative: 10 %
Neutro Abs: 2.7 10*3/uL (ref 1.7–7.7)
Neutrophils Relative %: 54 %
Platelets: 231 10*3/uL (ref 150–400)
RBC: 4.87 MIL/uL (ref 3.87–5.11)
RDW: 13.4 % (ref 11.5–15.5)
WBC: 5 10*3/uL (ref 4.0–10.5)
nRBC: 0 % (ref 0.0–0.2)

## 2020-07-03 NOTE — Progress Notes (Signed)
No new changes noted today 

## 2020-07-03 NOTE — Progress Notes (Signed)
Hematology/Oncology  Follow up note [facility] [phone] [phone]   Patient Care Team: [name], MD as PCP - General (Family Medicine)  REFERRING PROVIDER: Dr.Chen CHIEF COMPLAINTS/REASON FOR VISIT:  Follow up for  PE  HISTORY OF PRESENTING ILLNESS:  Jenny Thomas is a  74 y.o.  female with PMH listed below who was referred to me for evaluation of PE management.  Present to emergency room on 11/23/2018 for evaluation progressive shortness of breath.   CT chest angiogram showed bilateral pulmonary emboli, right greater than left, involving segmental branches of all lobes of the right lung and the left lower lobe.  CT findings equivocal for right heart strain with strengthening after interval ventricular septum and RV/LV ratio of approximately 1. 2D echo showed left ventricular function 55 to 60%, wall motion was normal.  No regional wall motion abnormalities.  Moderate regurgitation.  Right ventricle mildly dilated.  Double thickness was normal.  Systolic function was low normal.  Right atrium was mildly dilated.  Tricuspid valve moderate regurgitation.  Pulmonary arteries systolic pressure was moderately elevated.  PA peak pressure 46 mm Hg No lower extremity DVT was found. Patient was started on IV heparin. Switch to Eliquis at discharge. Denies any proceeding immobilizing factors.     INTERVAL HISTORY Jenny Thomas is a 74 y.o. female who has above history reviewed by me today presents for follow up visit for management of unprovoked pulmonary embolism. Problems and complaints are listed below Patient has been taking Eliquis 2.5 mg twice daily.  She reports doing well. Denies any bleeding events. Chronic bilateral lower extremity edema unchanged.   Review of Systems  Constitutional: Negative for appetite change, chills, fatigue and fever.  HENT:   Negative for hearing loss and voice change.   Eyes: Negative for eye  problems.  Respiratory: Negative for chest tightness and cough.   Cardiovascular: Positive for leg swelling. Negative for chest pain.  Gastrointestinal: Negative for abdominal distention, abdominal pain and blood in stool.  Endocrine: Negative for hot flashes.  Genitourinary: Negative for difficulty urinating and frequency.   Musculoskeletal: Negative for arthralgias.  Skin: Negative for itching and rash.  Neurological: Negative for extremity weakness.  Hematological: Negative for adenopathy.  Psychiatric/Behavioral: Negative for confusion.    MEDICAL HISTORY:  Past Medical History:  Diagnosis Date  . Anxiety   . Asthma   . Back pain   . Constipation   . COPD (chronic obstructive pulmonary disease) ([place])   . Depression   . Dyspnea   . Gallbladder problem   . Hyperlipidemia   . Hypertension   . Hypothyroid   . Joint pain   . Leg edema   . Prediabetes     SURGICAL HISTORY: Past Surgical History:  Procedure Laterality Date  . ABDOMINAL HYSTERECTOMY    . CHOLECYSTECTOMY    . hernia repair    . LAPAROSCOPIC GASTRIC BANDING      SOCIAL HISTORY: Social History   Socioeconomic History  . Marital status: Single    Spouse name: Not on file  . Number of children: Not on file  . Years of education: Not on file  . Highest education level: Not on file  Occupational History  . Occupation: Retired  Tobacco Use  . Smoking status: Never Smoker  . Smokeless tobacco: Never Used  Vaping Use  . Vaping Use: Never used  Substance and Sexual Activity  . Alcohol use: Never  . Drug use: Not on file  .  Sexual activity: Not on file  Other Topics Concern  . Not on file  Social History Narrative  . Not on file   Social Determinants of Health   Financial Resource Strain:   . Difficulty of Paying Living Expenses:   Food Insecurity:   . Worried About Charity fundraiser in the Last Year:   . Arboriculturist in the Last Year:   Transportation Needs:   . Film/video editor  (Medical):   [name] Lack of Transportation (Non-Medical):   Physical Activity:   . Days of Exercise per Week:   . Minutes of Exercise per Session:   Stress:   . Feeling of Stress :   Social Connections:   . Frequency of Communication with Friends and Family:   . Frequency of Social Gatherings with Friends and Family:   . Attends Religious Services:   . Active Member of Clubs or Organizations:   . Attends Archivist Meetings:   [name] Marital Status:   Intimate Partner Violence:   . Fear of Current or Ex-Partner:   . Emotionally Abused:   [name] Physically Abused:   . Sexually Abused:     FAMILY HISTORY: Family History  Problem Relation Age of Onset  . Breast cancer Maternal Aunt   . Hypertension Mother   . Kidney disease Mother   . Obesity Mother   . Stroke Father   . Heart disease Father   . Alcoholism Father     ALLERGIES:  is allergic to bee pollen and percocet [oxycodone-acetaminophen].  MEDICATIONS:  Current Outpatient Medications  Medication Sig Dispense Refill  . ACCU-CHEK FASTCLIX LANCETS MISC 1 each by Does not apply route 2 (two) times daily. 100 each 0  . albuterol (PROVENTIL HFA;VENTOLIN HFA) 108 (90 Base) MCG/ACT inhaler Inhale into the lungs every 6 (six) hours as needed for wheezing or shortness of breath.    [name] albuterol (PROVENTIL) (2.5 MG/3ML) 0.083% nebulizer solution Take 2.5 mg by nebulization every 6 (six) hours as needed for wheezing or shortness of breath.    . Blood Glucose Monitoring Suppl (ACCU-CHEK NANO SMARTVIEW) w/Device KIT 1 each by Does not apply route daily. Check blood sugar twice a day. 1 kit 0  . busPIRone (BUSPAR) 5 MG tablet Take 5 mg by mouth 2 (two) times daily.     . Calcium Carbonate-Vitamin D 600-400 MG-UNIT tablet Take by mouth.    . cholecalciferol (VITAMIN D3) 25 MCG (1000 UNIT) tablet Take 1,000 Units by mouth daily.    . citalopram (CELEXA) 40 MG tablet Take 40 mg by mouth daily.    [name] ELIQUIS 2.5 MG TABS tablet TAKE 1 TABLET  TWICE DAILY 180 tablet 1  . ferrous sulfate 325 (65 FE) MG EC tablet Take 1 tablet (325 mg total) by mouth daily. (Patient taking differently: Take 325 mg by mouth daily. OTC) 30 tablet 3  . Fluticasone-Salmeterol (ADVAIR) 250-50 MCG/DOSE AEPB Inhale 1 puff into the lungs 2 (two) times daily.    . furosemide (LASIX) 20 MG tablet Take by mouth.    . levothyroxine (SYNTHROID, LEVOTHROID) 88 MCG tablet Take 88 mcg by mouth daily before breakfast.    . montelukast (SINGULAIR) 10 MG tablet Take 10 mg by mouth at bedtime.    . Multiple Vitamin (MULTI-VITAMIN) tablet Take by mouth.    . nystatin cream (MYCOSTATIN) Apply topically.    . simvastatin (ZOCOR) 20 MG tablet Take 20 mg by mouth at bedtime.     [name] alendronate (  FOSAMAX) 70 MG tablet Take 70 mg by mouth once a week. Take with a full glass of water on an empty stomach. (Patient not taking: Reported on 07/03/2020)    . benzonatate (TESSALON) 200 MG capsule TAKE 1 CAPSULE BY MOUTH THREE TIMES DAILY AS NEEDED FOR COUGH (Patient not taking: Reported on 07/03/2020)    . cetirizine (ZYRTEC) 10 MG tablet Take 10 mg by mouth daily. (Patient not taking: Reported on 07/03/2020)    . chlorhexidine (PERIDEX) 0.12 % solution  (Patient not taking: Reported on 07/03/2020)    . methocarbamol (ROBAXIN) 500 MG tablet Take 1 tablet (500 mg total) by mouth 4 (four) times daily. (Patient not taking: Reported on 07/03/2020) 16 tablet 0  . potassium chloride SA (KLOR-CON) 20 MEQ tablet     . predniSONE (DELTASONE) 50 MG tablet Take 1 tablet (50 mg total) by mouth daily with breakfast. (Patient not taking: Reported on 07/03/2020) 5 tablet 0  . Vitamin D, Ergocalciferol, (DRISDOL) 1.25 MG (50000 UT) CAPS capsule Take 1 capsule (50,000 Units total) by mouth every 7 (seven) days. (Patient not taking: Reported on 05/31/2019) 4 capsule 0   No current facility-administered medications for this visit.     PHYSICAL EXAMINATION: ECOG PERFORMANCE STATUS: 0 - Asymptomatic Vitals:    07/03/20 1036  BP: (!) 141/77  Pulse: 71  Resp: 18  Temp: (!) 97.4 F (36.3 C)  SpO2: 97%   Filed Weights   07/03/20 1036  Weight: (!) 236 lb 12.8 oz (107.4 kg)    Physical Exam Constitutional:      General: She is not in acute distress. HENT:     Head: Normocephalic and atraumatic.  Eyes:     General: No scleral icterus.    Pupils: Pupils are equal, round, and reactive to light.  Cardiovascular:     Rate and Rhythm: Normal rate and regular rhythm.     Heart sounds: Normal heart sounds.  Pulmonary:     Effort: Pulmonary effort is normal. No respiratory distress.     Breath sounds: No wheezing.  Abdominal:     General: Bowel sounds are normal. There is no distension.     Palpations: Abdomen is soft. There is no mass.     Tenderness: There is no abdominal tenderness.  Musculoskeletal:        General: No deformity. Normal range of motion.     Cervical back: Normal range of motion and neck supple.     Comments: Bilateral lower extremity trace edema, varicose veins  Skin:    General: Skin is warm and dry.     Findings: No erythema or rash.  Neurological:     Mental Status: She is alert and oriented to person, place, and time.     Cranial Nerves: No cranial nerve deficit.     Coordination: Coordination normal.  Psychiatric:        Behavior: Behavior normal.        Thought Content: Thought content normal.      LABORATORY DATA:  I have reviewed the data as listed Lab Results  Component Value Date   WBC 5.0 07/03/2020   HGB 14.8 07/03/2020   HCT 43.2 07/03/2020   MCV 88.7 07/03/2020   PLT 231 07/03/2020   Recent Labs    08/27/19 1303 02/24/20 1117 07/03/20 1002  NA 140 137 139  K 4.1 4.1 3.9  CL 104 105 106  CO2 28 26 22   GLUCOSE 154* 107* 111*  BUN 19 19 16  CREATININE 1.23* 1.19* 1.25*  CALCIUM 8.9 8.7* 8.9  GFRNONAA 44* 46* 43*  GFRAA 51* 53* 50*  PROT 6.5 7.1 7.5  ALBUMIN 3.8 4.0 4.3  AST 22 22 23   ALT 13 15 14   ALKPHOS 81 99 88  BILITOT 0.9  0.8 1.0   Iron/TIBC/Ferritin/ %Sat    Component Value Date/Time   IRON 65 12/10/2018 1543   TIBC 422 12/10/2018 1543   FERRITIN 16 12/10/2018 1543   IRONPCTSAT 15 12/10/2018 1543        ASSESSMENT & PLAN:  1. Bilateral pulmonary embolism (HCC)    Unprovoked PE, recommend long term anticoagulation.  Continue Eliquis 2.5 mg twice daily.  I will review 49-monthsupply  Chronic kidney disease, creatinine is stable. No anemia.  Chronic lower extremity edema, likely due to vein insufficiency or lymphedema.   Recommend leg elevation and compression stocking. Orders Placed This Encounter  Procedures  . Comprehensive metabolic panel    Standing Status:   Future    Standing Expiration Date:   07/03/2021  . CBC with Differential/Platelet    Standing Status:   Future    Standing Expiration Date:   07/03/2021    All questions were answered. The patient knows to call the clinic with any problems questions or concerns.  Return of visit:6  months with repeat labs and MD assessment. We spent sufficient time to discuss many aspect of care, questions were answered to patient's satisfaction.  ZEarlie Server MD, PhD Hematology Oncology CSurgcenter Of Orange Park LLCat AEast Georgia Regional Medical CenterPager- [phone]/23/2021

## 2020-08-24 DIAGNOSIS — I10 Essential (primary) hypertension: Secondary | ICD-10-CM | POA: Diagnosis not present

## 2020-08-24 DIAGNOSIS — Z136 Encounter for screening for cardiovascular disorders: Secondary | ICD-10-CM | POA: Diagnosis not present

## 2020-08-24 DIAGNOSIS — N1831 Chronic kidney disease, stage 3a: Secondary | ICD-10-CM | POA: Diagnosis not present

## 2020-08-24 DIAGNOSIS — R7309 Other abnormal glucose: Secondary | ICD-10-CM | POA: Diagnosis not present

## 2020-08-24 DIAGNOSIS — H40003 Preglaucoma, unspecified, bilateral: Secondary | ICD-10-CM | POA: Diagnosis not present

## 2020-08-24 DIAGNOSIS — E039 Hypothyroidism, unspecified: Secondary | ICD-10-CM | POA: Diagnosis not present

## 2020-08-24 DIAGNOSIS — E7800 Pure hypercholesterolemia, unspecified: Secondary | ICD-10-CM | POA: Diagnosis not present

## 2020-08-29 ENCOUNTER — Other Ambulatory Visit: Payer: Self-pay | Admitting: Oncology

## 2020-08-31 NOTE — Telephone Encounter (Signed)
I gave 3 months supply with 1 refill to her at last visit. She should not need a refill now

## 2020-09-02 DIAGNOSIS — E039 Hypothyroidism, unspecified: Secondary | ICD-10-CM | POA: Diagnosis not present

## 2020-09-02 DIAGNOSIS — R7303 Prediabetes: Secondary | ICD-10-CM | POA: Diagnosis not present

## 2020-09-02 DIAGNOSIS — I129 Hypertensive chronic kidney disease with stage 1 through stage 4 chronic kidney disease, or unspecified chronic kidney disease: Secondary | ICD-10-CM | POA: Diagnosis not present

## 2020-09-02 DIAGNOSIS — Z23 Encounter for immunization: Secondary | ICD-10-CM | POA: Diagnosis not present

## 2020-09-02 DIAGNOSIS — N1831 Chronic kidney disease, stage 3a: Secondary | ICD-10-CM | POA: Diagnosis not present

## 2020-09-02 DIAGNOSIS — E7800 Pure hypercholesterolemia, unspecified: Secondary | ICD-10-CM | POA: Diagnosis not present

## 2020-09-02 DIAGNOSIS — Z6841 Body Mass Index (BMI) 40.0 and over, adult: Secondary | ICD-10-CM | POA: Diagnosis not present

## 2020-10-20 DIAGNOSIS — E663 Overweight: Secondary | ICD-10-CM | POA: Diagnosis not present

## 2020-10-20 DIAGNOSIS — J4520 Mild intermittent asthma, uncomplicated: Secondary | ICD-10-CM | POA: Diagnosis not present

## 2020-10-20 DIAGNOSIS — G4733 Obstructive sleep apnea (adult) (pediatric): Secondary | ICD-10-CM | POA: Diagnosis not present

## 2020-12-27 DIAGNOSIS — G4733 Obstructive sleep apnea (adult) (pediatric): Secondary | ICD-10-CM | POA: Diagnosis not present

## 2021-01-01 ENCOUNTER — Other Ambulatory Visit: Payer: Self-pay | Admitting: *Deleted

## 2021-01-01 MED ORDER — APIXABAN 2.5 MG PO TABS: 2.5000 mg | ORAL_TABLET | Freq: Two times a day (BID) | ORAL | 1 refills | Status: DC

## 2021-01-05 ENCOUNTER — Encounter: Payer: Self-pay | Admitting: Oncology

## 2021-01-05 ENCOUNTER — Inpatient Hospital Stay: Payer: Medicare HMO | Attending: Oncology

## 2021-01-05 ENCOUNTER — Inpatient Hospital Stay (HOSPITAL_BASED_OUTPATIENT_CLINIC_OR_DEPARTMENT_OTHER): Payer: Medicare HMO | Admitting: Oncology

## 2021-01-05 VITALS — BP 144/77 | HR 72 | Temp 97.8°F | Resp 18 | Wt 240.9 lb

## 2021-01-05 DIAGNOSIS — E039 Hypothyroidism, unspecified: Secondary | ICD-10-CM | POA: Diagnosis not present

## 2021-01-05 DIAGNOSIS — Z7901 Long term (current) use of anticoagulants: Secondary | ICD-10-CM | POA: Insufficient documentation

## 2021-01-05 DIAGNOSIS — N189 Chronic kidney disease, unspecified: Secondary | ICD-10-CM | POA: Diagnosis not present

## 2021-01-05 DIAGNOSIS — Z86711 Personal history of pulmonary embolism: Secondary | ICD-10-CM | POA: Diagnosis not present

## 2021-01-05 DIAGNOSIS — N1831 Chronic kidney disease, stage 3a: Secondary | ICD-10-CM | POA: Diagnosis not present

## 2021-01-05 DIAGNOSIS — I129 Hypertensive chronic kidney disease with stage 1 through stage 4 chronic kidney disease, or unspecified chronic kidney disease: Secondary | ICD-10-CM | POA: Diagnosis not present

## 2021-01-05 DIAGNOSIS — E7800 Pure hypercholesterolemia, unspecified: Secondary | ICD-10-CM | POA: Diagnosis not present

## 2021-01-05 DIAGNOSIS — R7303 Prediabetes: Secondary | ICD-10-CM | POA: Diagnosis not present

## 2021-01-05 DIAGNOSIS — I2699 Other pulmonary embolism without acute cor pulmonale: Secondary | ICD-10-CM

## 2021-01-05 LAB — COMPREHENSIVE METABOLIC PANEL
ALT: 16 U/L (ref 0–44)
AST: 26 U/L (ref 15–41)
Albumin: 3.8 g/dL (ref 3.5–5.0)
Alkaline Phosphatase: 82 U/L (ref 38–126)
Anion gap: 10 (ref 5–15)
BUN: 20 mg/dL (ref 8–23)
CO2: 23 mmol/L (ref 22–32)
Calcium: 8.7 mg/dL — ABNORMAL LOW (ref 8.9–10.3)
Chloride: 103 mmol/L (ref 98–111)
Creatinine, Ser: 1.16 mg/dL — ABNORMAL HIGH (ref 0.44–1.00)
GFR, Estimated: 50 mL/min — ABNORMAL LOW (ref >60)
Glucose, Bld: 134 mg/dL — ABNORMAL HIGH (ref 70–99)
Potassium: 4 mmol/L (ref 3.5–5.1)
Sodium: 136 mmol/L (ref 135–145)
Total Bilirubin: 0.8 mg/dL (ref 0.3–1.2)
Total Protein: 6.7 g/dL (ref 6.5–8.1)

## 2021-01-05 LAB — CBC WITH DIFFERENTIAL/PLATELET
Abs Immature Granulocytes: 0.01 10*3/uL (ref 0.00–0.07)
Basophils Absolute: 0 10*3/uL (ref 0.0–0.1)
Basophils Relative: 1 %
Eosinophils Absolute: 0.1 10*3/uL (ref 0.0–0.5)
Eosinophils Relative: 3 %
HCT: 41.3 % (ref 36.0–46.0)
Hemoglobin: 13.9 g/dL (ref 12.0–15.0)
Immature Granulocytes: 0 %
Lymphocytes Relative: 40 %
Lymphs Abs: 2.1 10*3/uL (ref 0.7–4.0)
MCH: 30.8 pg (ref 26.0–34.0)
MCHC: 33.7 g/dL (ref 30.0–36.0)
MCV: 91.6 fL (ref 80.0–100.0)
Monocytes Absolute: 0.4 10*3/uL (ref 0.1–1.0)
Monocytes Relative: 8 %
Neutro Abs: 2.5 10*3/uL (ref 1.7–7.7)
Neutrophils Relative %: 48 %
Platelets: 220 10*3/uL (ref 150–400)
RBC: 4.51 MIL/uL (ref 3.87–5.11)
RDW: 13.6 % (ref 11.5–15.5)
WBC: 5.2 10*3/uL (ref 4.0–10.5)
nRBC: 0 % (ref 0.0–0.2)

## 2021-01-05 NOTE — Progress Notes (Signed)
Pt here for follow up. No new concerns voiced.   

## 2021-01-05 NOTE — Progress Notes (Signed)
Hematology/Oncology  Follow up note [facility] Telephone:([phone] Fax:(336) [phone]   Patient Care Team: [name], MD as PCP - General (Family Medicine)  REFERRING PROVIDER: Dr.Chen CHIEF COMPLAINTS/REASON FOR VISIT:  Follow up for  PE  HISTORY OF PRESENTING ILLNESS:  Jenny Thomas is a  75 y.o.  female with PMH listed below who was referred to me for evaluation of PE management.  Present to emergency room on 11/23/2018 for evaluation progressive shortness of breath.   CT chest angiogram showed bilateral pulmonary emboli, right greater than left, involving segmental branches of all lobes of the right lung and the left lower lobe.  CT findings equivocal for right heart strain with strengthening after interval ventricular septum and RV/LV ratio of approximately 1. 2D echo showed left ventricular function 55 to 60%, wall motion was normal.  No regional wall motion abnormalities.  Moderate regurgitation.  Right ventricle mildly dilated.  Double thickness was normal.  Systolic function was low normal.  Right atrium was mildly dilated.  Tricuspid valve moderate regurgitation.  Pulmonary arteries systolic pressure was moderately elevated.  PA peak pressure 46 mm Hg No lower extremity DVT was found. Patient was started on IV heparin. Switch to Eliquis at discharge. Denies any proceeding immobilizing factors.  Patient finished 6 months + of Eliquis 5 mg twice daily and was switched to Eliquis 2.5 mg twice daily as maintenance.   INTERVAL HISTORY Jenny Thomas is a 75 y.o. female who has above history reviewed by me today presents for follow up visit for management of unprovoked pulmonary embolism. Problems and complaints are listed below Patient has been taking Eliquis 2.5 mg twice daily. Patient reports tolerating well, no bleeding events. Chronic mild lower extremity swelling, stable.  Denies any shortness of breath, cough, chest pain.  Review  of Systems  Constitutional: Negative for appetite change, chills, fatigue and fever.  HENT:   Negative for hearing loss and voice change.   Eyes: Negative for eye problems.  Respiratory: Negative for chest tightness and cough.   Cardiovascular: Positive for leg swelling. Negative for chest pain.  Gastrointestinal: Negative for abdominal distention, abdominal pain and blood in stool.  Endocrine: Negative for hot flashes.  Genitourinary: Negative for difficulty urinating and frequency.   Musculoskeletal: Negative for arthralgias.  Skin: Negative for itching and rash.  Neurological: Negative for extremity weakness.  Hematological: Negative for adenopathy.  Psychiatric/Behavioral: Negative for confusion.    MEDICAL HISTORY:  Past Medical History:  Diagnosis Date  . Anxiety   . Asthma   . Back pain   . Constipation   . COPD (chronic obstructive pulmonary disease) ([place])   . Depression   . Dyspnea   . Gallbladder problem   . Hyperlipidemia   . Hypertension   . Hypothyroid   . Joint pain   . Leg edema   . Prediabetes     SURGICAL HISTORY: Past Surgical History:  Procedure Laterality Date  . ABDOMINAL HYSTERECTOMY    . CHOLECYSTECTOMY    . hernia repair    . LAPAROSCOPIC GASTRIC BANDING      SOCIAL HISTORY: Social History   Socioeconomic History  . Marital status: Single    Spouse name: Not on file  . Number of children: Not on file  . Years of education: Not on file  . Highest education level: Not on file  Occupational History  . Occupation: Retired  Tobacco Use  . Smoking status: Never Smoker  . Smokeless tobacco: Never Used  Vaping Use  . Vaping Use: Never used  Substance and Sexual Activity  . Alcohol use: Never  . Drug use: Not on file  . Sexual activity: Not on file  Other Topics Concern  . Not on file  Social History Narrative  . Not on file   Social Determinants of Health   Financial Resource Strain: Not on file  Food Insecurity: Not on file   Transportation Needs: Not on file  Physical Activity: Not on file  Stress: Not on file  Social Connections: Not on file  Intimate Partner Violence: Not on file    FAMILY HISTORY: Family History  Problem Relation Age of Onset  . Breast cancer Maternal Aunt   . Hypertension Mother   . Kidney disease Mother   . Obesity Mother   . Stroke Father   . Heart disease Father   . Alcoholism Father     ALLERGIES:  is allergic to bee pollen and percocet [oxycodone-acetaminophen].  MEDICATIONS:  Current Outpatient Medications  Medication Sig Dispense Refill  . ACCU-CHEK FASTCLIX LANCETS MISC 1 each by Does not apply route 2 (two) times daily. 100 each 0  . albuterol (PROVENTIL) (2.5 MG/3ML) 0.083% nebulizer solution Take 2.5 mg by nebulization every 6 (six) hours as needed for wheezing or shortness of breath.    [name] apixaban (ELIQUIS) 2.5 MG TABS tablet Take 1 tablet (2.5 mg total) by mouth 2 (two) times daily. 180 tablet 1  . benzonatate (TESSALON) 200 MG capsule TAKE 1 CAPSULE BY MOUTH THREE TIMES DAILY AS NEEDED FOR COUGH    . Blood Glucose Monitoring Suppl (ACCU-CHEK NANO SMARTVIEW) w/Device KIT 1 each by Does not apply route daily. Check blood sugar twice a day. 1 kit 0  . busPIRone (BUSPAR) 5 MG tablet Take 5 mg by mouth 2 (two) times daily.     . Calcium Carbonate-Vitamin D 600-400 MG-UNIT tablet Take by mouth.    . cetirizine (ZYRTEC) 10 MG tablet Take 10 mg by mouth daily.    . citalopram (CELEXA) 40 MG tablet Take 40 mg by mouth daily.    . ferrous sulfate 325 (65 FE) MG EC tablet Take 1 tablet (325 mg total) by mouth daily. (Patient taking differently: Take 325 mg by mouth daily. OTC) 30 tablet 3  . Fluticasone-Salmeterol (ADVAIR) 250-50 MCG/DOSE AEPB Inhale 1 puff into the lungs 2 (two) times daily.    . furosemide (LASIX) 20 MG tablet Take by mouth.    . levothyroxine (SYNTHROID, LEVOTHROID) 88 MCG tablet Take 88 mcg by mouth daily before breakfast.    . losartan (COZAAR) 25 MG  tablet Take by mouth.    . montelukast (SINGULAIR) 10 MG tablet Take 10 mg by mouth at bedtime.    . Multiple Vitamin (MULTI-VITAMIN) tablet Take by mouth.    . pantoprazole (PROTONIX) 20 MG tablet Take 1 tablet by mouth daily.    . potassium chloride SA (KLOR-CON) 20 MEQ tablet     . simvastatin (ZOCOR) 20 MG tablet Take 20 mg by mouth at bedtime.     [name] albuterol (PROVENTIL HFA;VENTOLIN HFA) 108 (90 Base) MCG/ACT inhaler Inhale into the lungs every 6 (six) hours as needed for wheezing or shortness of breath. (Patient not taking: Reported on 01/05/2021)    . alendronate (FOSAMAX) 70 MG tablet Take 70 mg by mouth once a week. Take with a full glass of water on an empty stomach. (Patient not taking: No sig reported)    . chlorhexidine (PERIDEX) 0.12 % solution  (  Patient not taking: No sig reported)    . cholecalciferol (VITAMIN D3) 25 MCG (1000 UNIT) tablet Take 1,000 Units by mouth daily. (Patient not taking: Reported on 01/05/2021)    . methocarbamol (ROBAXIN) 500 MG tablet Take 1 tablet (500 mg total) by mouth 4 (four) times daily. (Patient not taking: Reported on 01/05/2021) 16 tablet 0  . nystatin cream (MYCOSTATIN) Apply topically. (Patient not taking: Reported on 01/05/2021)    . predniSONE (DELTASONE) 50 MG tablet Take 1 tablet (50 mg total) by mouth daily with breakfast. (Patient not taking: No sig reported) 5 tablet 0  . Vitamin D, Ergocalciferol, (DRISDOL) 1.25 MG (50000 UT) CAPS capsule Take 1 capsule (50,000 Units total) by mouth every 7 (seven) days. (Patient not taking: No sig reported) 4 capsule 0   No current facility-administered medications for this visit.     PHYSICAL EXAMINATION: ECOG PERFORMANCE STATUS: 0 - Asymptomatic Vitals:   01/05/21 1005  BP: (!) 144/77  Pulse: 72  Resp: 18  Temp: 97.8 F (36.6 C)   Filed Weights   01/05/21 1005  Weight: 240 lb 14.4 oz (109.3 kg)    Physical Exam Constitutional:      General: She is not in acute distress. HENT:     Head:  Normocephalic and atraumatic.  Eyes:     General: No scleral icterus.    Pupils: Pupils are equal, round, and reactive to light.  Cardiovascular:     Rate and Rhythm: Normal rate and regular rhythm.     Heart sounds: Normal heart sounds.  Pulmonary:     Effort: Pulmonary effort is normal. No respiratory distress.     Breath sounds: No wheezing.  Abdominal:     General: Bowel sounds are normal. There is no distension.     Palpations: Abdomen is soft. There is no mass.     Tenderness: There is no abdominal tenderness.  Musculoskeletal:        General: No deformity. Normal range of motion.     Cervical back: Normal range of motion and neck supple.     Comments: Bilateral lower extremity trace edema, varicose veins  Skin:    General: Skin is warm and dry.     Findings: No erythema or rash.  Neurological:     Mental Status: She is alert and oriented to person, place, and time.     Cranial Nerves: No cranial nerve deficit.     Coordination: Coordination normal.  Psychiatric:        Behavior: Behavior normal.        Thought Content: Thought content normal.      LABORATORY DATA:  I have reviewed the data as listed Lab Results  Component Value Date   WBC 5.2 01/05/2021   HGB 13.9 01/05/2021   HCT 41.3 01/05/2021   MCV 91.6 01/05/2021   PLT 220 01/05/2021   Recent Labs    02/24/20 1117 07/03/20 1002 01/05/21 0919  NA 137 139 136  K 4.1 3.9 4.0  CL 105 106 103  CO2 26 22 23   GLUCOSE 107* 111* 134*  BUN 19 16 20   CREATININE 1.19* 1.25* 1.16*  CALCIUM 8.7* 8.9 8.7*  GFRNONAA 46* 43* 50*  GFRAA 53* 50*  --   PROT 7.1 7.5 6.7  ALBUMIN 4.0 4.3 3.8  AST 22 23 26   ALT 15 14 16   ALKPHOS 99 88 82  BILITOT 0.8 1.0 0.8   Iron/TIBC/Ferritin/ %Sat    Component Value Date/Time   IRON 65  12/10/2018 1543   TIBC 422 12/10/2018 1543   FERRITIN 16 12/10/2018 1543   IRONPCTSAT 15 12/10/2018 1543        ASSESSMENT & PLAN:  1. History of pulmonary embolism   2. Chronic  anticoagulation    Unprovoked PE, recommend long term anticoagulation.  Continue Eliquis 2.5 mg twice daily.  She is doing well clinically.  Chronic kidney disease, stable creatinine.  No anemia.  Avoid nephrotoxin. Chronic lower extremity edema, likely due to vein insufficiency or lymphedema.   Recommend leg elevation and compression stocking. Orders Placed This Encounter  Procedures  . CBC with Differential/Platelet    Standing Status:   Future    Standing Expiration Date:   01/05/2022  . Comprehensive metabolic panel    Standing Status:   Future    Standing Expiration Date:   01/05/2022    All questions were answered. The patient knows to call the clinic with any problems questions or concerns.  Return of visit:6  months with repeat labs and MD assessment. We spent sufficient time to discuss many aspect of care, questions were answered to patient's satisfaction.  Earlie Server, MD, PhD Hematology Oncology [facility] Pager- [phone] 01/05/2021

## 2021-01-11 DIAGNOSIS — N1831 Chronic kidney disease, stage 3a: Secondary | ICD-10-CM | POA: Diagnosis not present

## 2021-01-11 DIAGNOSIS — R7303 Prediabetes: Secondary | ICD-10-CM | POA: Diagnosis not present

## 2021-01-11 DIAGNOSIS — F3340 Major depressive disorder, recurrent, in remission, unspecified: Secondary | ICD-10-CM | POA: Diagnosis not present

## 2021-01-11 DIAGNOSIS — Z6841 Body Mass Index (BMI) 40.0 and over, adult: Secondary | ICD-10-CM | POA: Diagnosis not present

## 2021-01-11 DIAGNOSIS — E7800 Pure hypercholesterolemia, unspecified: Secondary | ICD-10-CM | POA: Diagnosis not present

## 2021-01-11 DIAGNOSIS — I129 Hypertensive chronic kidney disease with stage 1 through stage 4 chronic kidney disease, or unspecified chronic kidney disease: Secondary | ICD-10-CM | POA: Diagnosis not present

## 2021-01-11 DIAGNOSIS — J449 Chronic obstructive pulmonary disease, unspecified: Secondary | ICD-10-CM | POA: Diagnosis not present

## 2021-01-11 DIAGNOSIS — E039 Hypothyroidism, unspecified: Secondary | ICD-10-CM | POA: Diagnosis not present

## 2021-04-19 DIAGNOSIS — R0600 Dyspnea, unspecified: Secondary | ICD-10-CM | POA: Diagnosis not present

## 2021-04-19 DIAGNOSIS — J4520 Mild intermittent asthma, uncomplicated: Secondary | ICD-10-CM | POA: Diagnosis not present

## 2021-04-19 DIAGNOSIS — G4733 Obstructive sleep apnea (adult) (pediatric): Secondary | ICD-10-CM | POA: Diagnosis not present

## 2021-05-04 DIAGNOSIS — H52223 Regular astigmatism, bilateral: Secondary | ICD-10-CM | POA: Diagnosis not present

## 2021-05-18 DIAGNOSIS — D508 Other iron deficiency anemias: Secondary | ICD-10-CM | POA: Diagnosis not present

## 2021-05-18 DIAGNOSIS — R7303 Prediabetes: Secondary | ICD-10-CM | POA: Diagnosis not present

## 2021-05-18 DIAGNOSIS — I10 Essential (primary) hypertension: Secondary | ICD-10-CM | POA: Diagnosis not present

## 2021-05-18 DIAGNOSIS — E039 Hypothyroidism, unspecified: Secondary | ICD-10-CM | POA: Diagnosis not present

## 2021-05-18 DIAGNOSIS — E7800 Pure hypercholesterolemia, unspecified: Secondary | ICD-10-CM | POA: Diagnosis not present

## 2021-05-18 DIAGNOSIS — N1831 Chronic kidney disease, stage 3a: Secondary | ICD-10-CM | POA: Diagnosis not present

## 2021-05-31 ENCOUNTER — Other Ambulatory Visit: Payer: Self-pay | Admitting: Oncology

## 2021-06-10 DIAGNOSIS — J449 Chronic obstructive pulmonary disease, unspecified: Secondary | ICD-10-CM | POA: Diagnosis not present

## 2021-06-10 DIAGNOSIS — R059 Cough, unspecified: Secondary | ICD-10-CM | POA: Diagnosis not present

## 2021-06-21 DIAGNOSIS — G4733 Obstructive sleep apnea (adult) (pediatric): Secondary | ICD-10-CM | POA: Diagnosis not present

## 2021-06-24 DIAGNOSIS — H18413 Arcus senilis, bilateral: Secondary | ICD-10-CM | POA: Diagnosis not present

## 2021-06-24 DIAGNOSIS — H26491 Other secondary cataract, right eye: Secondary | ICD-10-CM | POA: Diagnosis not present

## 2021-06-24 DIAGNOSIS — Z961 Presence of intraocular lens: Secondary | ICD-10-CM | POA: Diagnosis not present

## 2021-06-24 DIAGNOSIS — H40013 Open angle with borderline findings, low risk, bilateral: Secondary | ICD-10-CM | POA: Diagnosis not present

## 2021-07-05 ENCOUNTER — Inpatient Hospital Stay (HOSPITAL_BASED_OUTPATIENT_CLINIC_OR_DEPARTMENT_OTHER): Payer: Medicare HMO | Admitting: Oncology

## 2021-07-05 ENCOUNTER — Encounter: Payer: Self-pay | Admitting: Oncology

## 2021-07-05 ENCOUNTER — Inpatient Hospital Stay: Payer: Medicare HMO | Attending: Oncology

## 2021-07-05 ENCOUNTER — Other Ambulatory Visit: Payer: Self-pay

## 2021-07-05 VITALS — BP 149/93 | HR 118 | Temp 98.2°F | Resp 18 | Wt 240.2 lb

## 2021-07-05 DIAGNOSIS — Z7901 Long term (current) use of anticoagulants: Secondary | ICD-10-CM

## 2021-07-05 DIAGNOSIS — N1832 Chronic kidney disease, stage 3b: Secondary | ICD-10-CM | POA: Diagnosis not present

## 2021-07-05 DIAGNOSIS — Z86711 Personal history of pulmonary embolism: Secondary | ICD-10-CM | POA: Insufficient documentation

## 2021-07-05 DIAGNOSIS — Z79899 Other long term (current) drug therapy: Secondary | ICD-10-CM | POA: Diagnosis not present

## 2021-07-05 LAB — COMPREHENSIVE METABOLIC PANEL
ALT: 17 U/L (ref 0–44)
AST: 27 U/L (ref 15–41)
Albumin: 4 g/dL (ref 3.5–5.0)
Alkaline Phosphatase: 86 U/L (ref 38–126)
Anion gap: 10 (ref 5–15)
BUN: 14 mg/dL (ref 8–23)
CO2: 24 mmol/L (ref 22–32)
Calcium: 8.9 mg/dL (ref 8.9–10.3)
Chloride: 104 mmol/L (ref 98–111)
Creatinine, Ser: 1.34 mg/dL — ABNORMAL HIGH (ref 0.44–1.00)
GFR, Estimated: 42 mL/min — ABNORMAL LOW (ref >60)
Glucose, Bld: 118 mg/dL — ABNORMAL HIGH (ref 70–99)
Potassium: 4.1 mmol/L (ref 3.5–5.1)
Sodium: 138 mmol/L (ref 135–145)
Total Bilirubin: 0.6 mg/dL (ref 0.3–1.2)
Total Protein: 7.1 g/dL (ref 6.5–8.1)

## 2021-07-05 LAB — CBC WITH DIFFERENTIAL/PLATELET
Abs Immature Granulocytes: 0.02 10*3/uL (ref 0.00–0.07)
Basophils Absolute: 0 10*3/uL (ref 0.0–0.1)
Basophils Relative: 1 %
Eosinophils Absolute: 0.2 10*3/uL (ref 0.0–0.5)
Eosinophils Relative: 3 %
HCT: 42.1 % (ref 36.0–46.0)
Hemoglobin: 14 g/dL (ref 12.0–15.0)
Immature Granulocytes: 0 %
Lymphocytes Relative: 32 %
Lymphs Abs: 1.8 10*3/uL (ref 0.7–4.0)
MCH: 31.4 pg (ref 26.0–34.0)
MCHC: 33.3 g/dL (ref 30.0–36.0)
MCV: 94.4 fL (ref 80.0–100.0)
Monocytes Absolute: 0.5 10*3/uL (ref 0.1–1.0)
Monocytes Relative: 8 %
Neutro Abs: 3.1 10*3/uL (ref 1.7–7.7)
Neutrophils Relative %: 56 %
Platelets: 221 10*3/uL (ref 150–400)
RBC: 4.46 MIL/uL (ref 3.87–5.11)
RDW: 13.3 % (ref 11.5–15.5)
WBC: 5.6 10*3/uL (ref 4.0–10.5)
nRBC: 0 % (ref 0.0–0.2)

## 2021-07-05 MED ORDER — APIXABAN 2.5 MG PO TABS: 2.5000 mg | ORAL_TABLET | Freq: Two times a day (BID) | ORAL | 1 refills | Status: DC

## 2021-07-05 NOTE — Progress Notes (Signed)
Patient here for follow up. No new concerns voiced.  °

## 2021-07-05 NOTE — Progress Notes (Signed)
Hematology/Oncology  Follow up note [name] Telephone:(336) [phone] Fax:(336) [phone]   Patient Care Team: [name], MD as PCP - General (Family Medicine)  REFERRING PROVIDER: Dr.Chen CHIEF COMPLAINTS/REASON FOR VISIT:  Follow up for  PE  HISTORY OF PRESENTING ILLNESS:  Jenny Thomas is a  75 y.o.  female with PMH listed below who was referred to me for evaluation of PE management.  Present to emergency room on 11/23/2018 for evaluation progressive shortness of breath.   CT chest angiogram showed bilateral pulmonary emboli, right greater than left, involving segmental branches of all lobes of the right lung and the left lower lobe.  CT findings equivocal for right heart strain with strengthening after interval ventricular septum and RV/LV ratio of approximately 1. 2D echo showed left ventricular function 55 to 60%, wall motion was normal.  No regional wall motion abnormalities.  Moderate regurgitation.  Right ventricle mildly dilated.  Double thickness was normal.  Systolic function was low normal.  Right atrium was mildly dilated.  Tricuspid valve moderate regurgitation.  Pulmonary arteries systolic pressure was moderately elevated.  PA peak pressure 46 mm Hg No lower extremity DVT was found. Patient was started on IV heparin. Switch to Eliquis at discharge. Denies any proceeding immobilizing factors.  Patient finished 6 months + of Eliquis 5 mg twice daily and was switched to Eliquis 2.5 mg twice daily as maintenance.   INTERVAL HISTORY Jenny Thomas is a 75 y.o. female who has above history reviewed by me today presents for follow up visit for management of unprovoked pulmonary embolism. Problems and complaints are listed below She takes Eliquis 2.$RemoveBeforeD'5mg'OleGliBtUEUTEG$  BID. Tolerates well.  She has no lower extremity swelling, SOB, chest pain, no bleeding events.   Review of Systems  Constitutional:  Negative for appetite change, chills, fatigue and  fever.  HENT:   Negative for hearing loss and voice change.   Eyes:  Negative for eye problems.  Respiratory:  Negative for chest tightness and cough.   Cardiovascular:  Negative for chest pain and leg swelling.  Gastrointestinal:  Negative for abdominal distention, abdominal pain and blood in stool.  Endocrine: Negative for hot flashes.  Genitourinary:  Negative for difficulty urinating and frequency.   Musculoskeletal:  Negative for arthralgias.  Skin:  Negative for itching and rash.  Neurological:  Negative for extremity weakness.  Hematological:  Negative for adenopathy.  Psychiatric/Behavioral:  Negative for confusion.    MEDICAL HISTORY:  Past Medical History:  Diagnosis Date   Anxiety    Asthma    Back pain    Constipation    COPD (chronic obstructive pulmonary disease) ([facility])    Depression    Dyspnea    Gallbladder problem    Hyperlipidemia    Hypertension    Hypothyroid    Joint pain    Leg edema    Prediabetes     SURGICAL HISTORY: Past Surgical History:  Procedure Laterality Date   ABDOMINAL HYSTERECTOMY     CHOLECYSTECTOMY     hernia repair     LAPAROSCOPIC GASTRIC BANDING      SOCIAL HISTORY: Social History   Socioeconomic History   Marital status: Single    Spouse name: Not on file   Number of children: Not on file   Years of education: Not on file   Highest education level: Not on file  Occupational History   Occupation: Retired  Tobacco Use   Smoking status: Never   Smokeless tobacco: Never  Vaping Use  Vaping Use: Never used  Substance and Sexual Activity   Alcohol use: Never   Drug use: Not on file   Sexual activity: Not on file  Other Topics Concern   Not on file  Social History Narrative   Not on file   Social Determinants of Health   Financial Resource Strain: Not on file  Food Insecurity: Not on file  Transportation Needs: Not on file  Physical Activity: Not on file  Stress: Not on file  Social Connections: Not on file   Intimate Partner Violence: Not on file    FAMILY HISTORY: Family History  Problem Relation Age of Onset   Breast cancer Maternal Aunt    Hypertension Mother    Kidney disease Mother    Obesity Mother    Stroke Father    Heart disease Father    Alcoholism Father     ALLERGIES:  is allergic to bee pollen and percocet [oxycodone-acetaminophen].  MEDICATIONS:  Current Outpatient Medications  Medication Sig Dispense Refill   ACCU-CHEK FASTCLIX LANCETS MISC 1 each by Does not apply route 2 (two) times daily. 100 each 0   albuterol (PROVENTIL HFA;VENTOLIN HFA) 108 (90 Base) MCG/ACT inhaler Inhale into the lungs every 6 (six) hours as needed for wheezing or shortness of breath.     albuterol (PROVENTIL) (2.5 MG/3ML) 0.083% nebulizer solution Take 2.5 mg by nebulization every 6 (six) hours as needed for wheezing or shortness of breath.     benzonatate (TESSALON) 200 MG capsule TAKE 1 CAPSULE BY MOUTH THREE TIMES DAILY AS NEEDED FOR COUGH     Blood Glucose Monitoring Suppl (ACCU-CHEK NANO SMARTVIEW) w/Device KIT 1 each by Does not apply route daily. Check blood sugar twice a day. 1 kit 0   busPIRone (BUSPAR) 5 MG tablet Take 5 mg by mouth 2 (two) times daily.      Calcium Carbonate-Vitamin D 600-400 MG-UNIT tablet Take by mouth.     cetirizine (ZYRTEC) 10 MG tablet Take 10 mg by mouth daily.     cholecalciferol (VITAMIN D3) 25 MCG (1000 UNIT) tablet Take 1,000 Units by mouth daily.     citalopram (CELEXA) 40 MG tablet Take 40 mg by mouth daily.     ferrous sulfate 325 (65 FE) MG EC tablet Take 1 tablet (325 mg total) by mouth daily. (Patient taking differently: Take 325 mg by mouth daily. OTC) 30 tablet 3   Fluticasone-Salmeterol (ADVAIR) 250-50 MCG/DOSE AEPB Inhale 1 puff into the lungs 2 (two) times daily.     furosemide (LASIX) 20 MG tablet Take 20 mg by mouth daily as needed.     levothyroxine (SYNTHROID, LEVOTHROID) 88 MCG tablet Take 88 mcg by mouth daily before breakfast.      losartan (COZAAR) 25 MG tablet Take by mouth.     montelukast (SINGULAIR) 10 MG tablet Take 10 mg by mouth at bedtime.     Multiple Vitamin (MULTI-VITAMIN) tablet Take by mouth.     pantoprazole (PROTONIX) 20 MG tablet Take 1 tablet by mouth daily.     potassium chloride SA (KLOR-CON) 20 MEQ tablet 20 mEq daily as needed. Used along with lasix . PRN     simvastatin (ZOCOR) 20 MG tablet Take 20 mg by mouth at bedtime.      alendronate (FOSAMAX) 70 MG tablet Take 70 mg by mouth once a week. Take with a full glass of water on an empty stomach. (Patient not taking: No sig reported)     apixaban (ELIQUIS) 2.5 MG TABS  tablet Take 1 tablet (2.5 mg total) by mouth 2 (two) times daily. 180 tablet 1   chlorhexidine (PERIDEX) 0.12 % solution  (Patient not taking: No sig reported)     methocarbamol (ROBAXIN) 500 MG tablet Take 1 tablet (500 mg total) by mouth 4 (four) times daily. (Patient not taking: No sig reported) 16 tablet 0   Vitamin D, Ergocalciferol, (DRISDOL) 1.25 MG (50000 UT) CAPS capsule Take 1 capsule (50,000 Units total) by mouth every 7 (seven) days. (Patient not taking: Reported on 07/05/2021) 4 capsule 0   No current facility-administered medications for this visit.     PHYSICAL EXAMINATION: ECOG PERFORMANCE STATUS: 0 - Asymptomatic Vitals:   07/05/21 1013  BP: (!) 149/93  Pulse: (!) 118  Resp: 18  Temp: 98.2 F (36.8 C)  SpO2: 98%   Filed Weights   07/05/21 1013  Weight: 240 lb 3.2 oz (109 kg)    Physical Exam Constitutional:      General: She is not in acute distress. HENT:     Head: Normocephalic and atraumatic.  Eyes:     General: No scleral icterus.    Pupils: Pupils are equal, round, and reactive to light.  Cardiovascular:     Rate and Rhythm: Normal rate and regular rhythm.     Heart sounds: Normal heart sounds.  Pulmonary:     Effort: Pulmonary effort is normal. No respiratory distress.     Breath sounds: No wheezing.  Abdominal:     General: Bowel sounds  are normal. There is no distension.     Palpations: Abdomen is soft. There is no mass.     Tenderness: There is no abdominal tenderness.  Musculoskeletal:        General: No deformity. Normal range of motion.     Cervical back: Normal range of motion and neck supple.     Comments: Bilateral lower extremity varicose veins  Skin:    General: Skin is warm and dry.     Findings: No erythema or rash.  Neurological:     Mental Status: She is alert and oriented to person, place, and time.     Cranial Nerves: No cranial nerve deficit.     Coordination: Coordination normal.  Psychiatric:        Behavior: Behavior normal.        Thought Content: Thought content normal.     LABORATORY DATA:  I have reviewed the data as listed Lab Results  Component Value Date   WBC 5.6 07/05/2021   HGB 14.0 07/05/2021   HCT 42.1 07/05/2021   MCV 94.4 07/05/2021   PLT 221 07/05/2021   Recent Labs    01/05/21 0919 07/05/21 1001  NA 136 138  K 4.0 4.1  CL 103 104  CO2 23 24  GLUCOSE 134* 118*  BUN 20 14  CREATININE 1.16* 1.34*  CALCIUM 8.7* 8.9  GFRNONAA 50* 42*  PROT 6.7 7.1  ALBUMIN 3.8 4.0  AST 26 27  ALT 16 17  ALKPHOS 82 86  BILITOT 0.8 0.6    Iron/TIBC/Ferritin/ %Sat    Component Value Date/Time   IRON 65 12/10/2018 1543   TIBC 422 12/10/2018 1543   FERRITIN 16 12/10/2018 1543   IRONPCTSAT 15 12/10/2018 1543        ASSESSMENT & PLAN:  1. History of pulmonary embolism   2. Chronic anticoagulation   3. Stage 3b chronic kidney disease ([place])    Unprovoked PE, recommend long term anticoagulation.  Labs are reviewed and  discussed with patient. Continue Eliquis 2.73m BID.  She is doing well clinically.   Chronic kidney disease, stable creatinine.  No anemia.  Avoid nephrotoxin.  All questions were answered. The patient knows to call the clinic with any problems questions or concerns.  Return of visit:6  months with repeat labs and  see NP.  Patient will discuss with PCP  Dr.Linthavong to see if he will further monitor and refill her Eliquis.  If so, patient will call and cancel further appointment.  We spent sufficient time to discuss many aspect of care, questions were answered to patient's satisfaction.  ZEarlie Server MD, PhD Hematology Oncology [facility]at AP[facility] HospitalPager- [phone]/25/2022

## 2021-07-08 DIAGNOSIS — Z6841 Body Mass Index (BMI) 40.0 and over, adult: Secondary | ICD-10-CM | POA: Diagnosis not present

## 2021-07-08 DIAGNOSIS — Z1389 Encounter for screening for other disorder: Secondary | ICD-10-CM | POA: Diagnosis not present

## 2021-07-08 DIAGNOSIS — R7303 Prediabetes: Secondary | ICD-10-CM | POA: Diagnosis not present

## 2021-07-08 DIAGNOSIS — I10 Essential (primary) hypertension: Secondary | ICD-10-CM | POA: Diagnosis not present

## 2021-07-08 DIAGNOSIS — Z0000 Encounter for general adult medical examination without abnormal findings: Secondary | ICD-10-CM | POA: Diagnosis not present

## 2021-07-08 DIAGNOSIS — E785 Hyperlipidemia, unspecified: Secondary | ICD-10-CM | POA: Diagnosis not present

## 2021-07-09 ENCOUNTER — Other Ambulatory Visit: Payer: Self-pay | Admitting: Family Medicine

## 2021-07-09 DIAGNOSIS — Z1231 Encounter for screening mammogram for malignant neoplasm of breast: Secondary | ICD-10-CM

## 2021-07-22 DIAGNOSIS — G4733 Obstructive sleep apnea (adult) (pediatric): Secondary | ICD-10-CM | POA: Diagnosis not present

## 2021-08-03 DIAGNOSIS — R079 Chest pain, unspecified: Secondary | ICD-10-CM | POA: Diagnosis not present

## 2021-08-03 DIAGNOSIS — J40 Bronchitis, not specified as acute or chronic: Secondary | ICD-10-CM | POA: Diagnosis not present

## 2021-08-03 DIAGNOSIS — U071 COVID-19: Secondary | ICD-10-CM | POA: Diagnosis not present

## 2021-08-03 DIAGNOSIS — Z79899 Other long term (current) drug therapy: Secondary | ICD-10-CM | POA: Diagnosis not present

## 2021-08-03 DIAGNOSIS — R059 Cough, unspecified: Secondary | ICD-10-CM | POA: Diagnosis not present

## 2021-08-03 DIAGNOSIS — R0602 Shortness of breath: Secondary | ICD-10-CM | POA: Diagnosis not present

## 2021-08-10 DIAGNOSIS — R509 Fever, unspecified: Secondary | ICD-10-CM | POA: Diagnosis not present

## 2021-08-10 DIAGNOSIS — E669 Obesity, unspecified: Secondary | ICD-10-CM | POA: Diagnosis not present

## 2021-08-10 DIAGNOSIS — R0602 Shortness of breath: Secondary | ICD-10-CM | POA: Diagnosis not present

## 2021-08-10 DIAGNOSIS — J441 Chronic obstructive pulmonary disease with (acute) exacerbation: Secondary | ICD-10-CM | POA: Diagnosis not present

## 2021-08-10 DIAGNOSIS — U071 COVID-19: Secondary | ICD-10-CM | POA: Diagnosis not present

## 2021-08-10 DIAGNOSIS — Z8616 Personal history of COVID-19: Secondary | ICD-10-CM | POA: Diagnosis not present

## 2021-08-10 DIAGNOSIS — R5383 Other fatigue: Secondary | ICD-10-CM | POA: Diagnosis not present

## 2021-08-10 DIAGNOSIS — Z79899 Other long term (current) drug therapy: Secondary | ICD-10-CM | POA: Diagnosis not present

## 2021-08-10 DIAGNOSIS — R059 Cough, unspecified: Secondary | ICD-10-CM | POA: Diagnosis not present

## 2021-08-10 DIAGNOSIS — I10 Essential (primary) hypertension: Secondary | ICD-10-CM | POA: Diagnosis not present

## 2021-08-19 DIAGNOSIS — J441 Chronic obstructive pulmonary disease with (acute) exacerbation: Secondary | ICD-10-CM | POA: Diagnosis not present

## 2021-08-19 DIAGNOSIS — G4733 Obstructive sleep apnea (adult) (pediatric): Secondary | ICD-10-CM | POA: Diagnosis not present

## 2021-08-19 DIAGNOSIS — Z6841 Body Mass Index (BMI) 40.0 and over, adult: Secondary | ICD-10-CM | POA: Diagnosis not present

## 2021-08-19 DIAGNOSIS — J9801 Acute bronchospasm: Secondary | ICD-10-CM | POA: Diagnosis not present

## 2021-08-19 DIAGNOSIS — R0609 Other forms of dyspnea: Secondary | ICD-10-CM | POA: Diagnosis not present

## 2021-08-20 ENCOUNTER — Other Ambulatory Visit: Payer: Self-pay

## 2021-08-20 ENCOUNTER — Ambulatory Visit
Admission: RE | Admit: 2021-08-20 | Discharge: 2021-08-20 | Disposition: A | Discharge status: Home / Self Care | Payer: Medicare HMO | Source: Ambulatory Visit | Attending: Family Medicine | Admitting: Family Medicine

## 2021-08-20 DIAGNOSIS — Z1231 Encounter for screening mammogram for malignant neoplasm of breast: Secondary | ICD-10-CM | POA: Insufficient documentation

## 2021-08-22 DIAGNOSIS — G4733 Obstructive sleep apnea (adult) (pediatric): Secondary | ICD-10-CM | POA: Diagnosis not present

## 2021-08-30 DIAGNOSIS — J4530 Mild persistent asthma, uncomplicated: Secondary | ICD-10-CM | POA: Diagnosis not present

## 2021-08-30 DIAGNOSIS — R0609 Other forms of dyspnea: Secondary | ICD-10-CM | POA: Diagnosis not present

## 2021-08-30 DIAGNOSIS — J310 Chronic rhinitis: Secondary | ICD-10-CM | POA: Diagnosis not present

## 2021-08-30 DIAGNOSIS — G4733 Obstructive sleep apnea (adult) (pediatric): Secondary | ICD-10-CM | POA: Diagnosis not present

## 2021-08-30 DIAGNOSIS — Z9989 Dependence on other enabling machines and devices: Secondary | ICD-10-CM | POA: Diagnosis not present

## 2021-09-21 DIAGNOSIS — G4733 Obstructive sleep apnea (adult) (pediatric): Secondary | ICD-10-CM | POA: Diagnosis not present

## 2021-10-03 IMAGING — US US EXTREM LOW VENOUS*R*
1 series · 14 of 24 positions shown · non-contrast
Comparison: November 24, 2018.

CLINICAL DATA: Right lower extremity swelling.

EXAM:
Right LOWER EXTREMITY VENOUS DOPPLER ULTRASOUND
TECHNIQUE: Gray-scale sonography with compression, as well as color and duplex
ultrasound, were performed to evaluate the deep venous system(s)
from the level of the common femoral vein through the popliteal and
proximal calf veins.

[Series 1: lev · 14 of 32 slices shown]
[im 1/32]
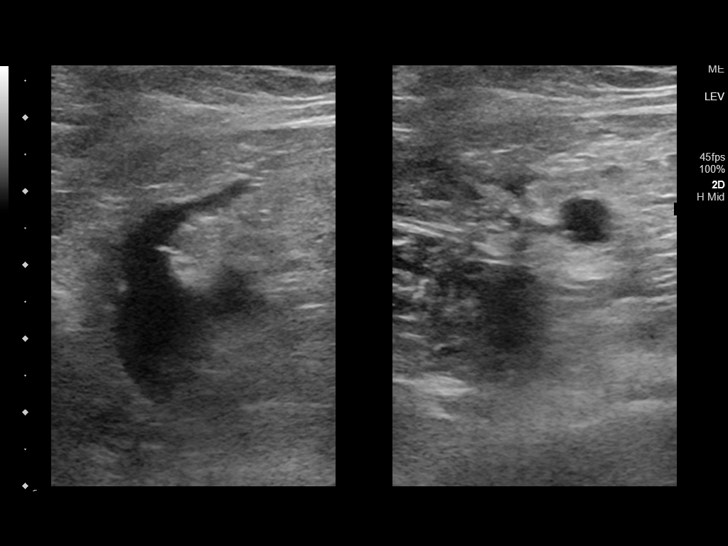
[im 3/32]
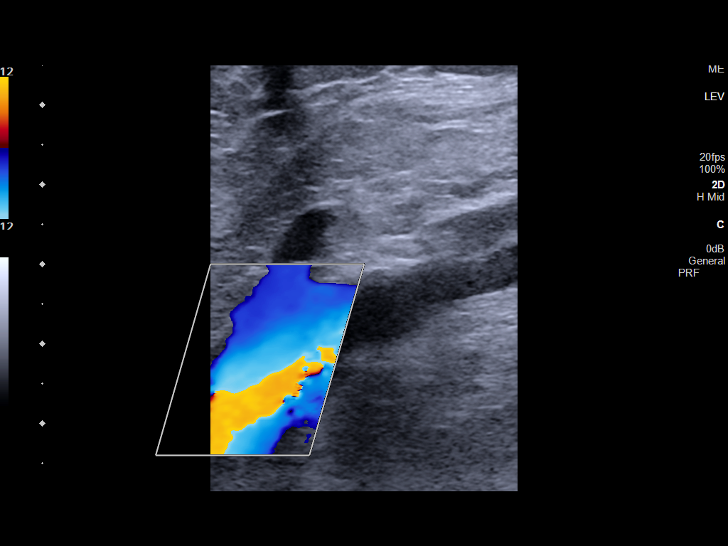
[im 6/32]
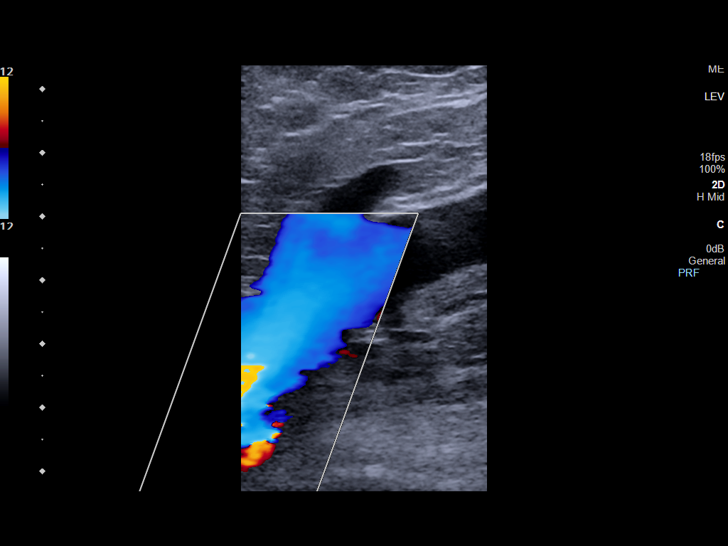
[im 9/32]
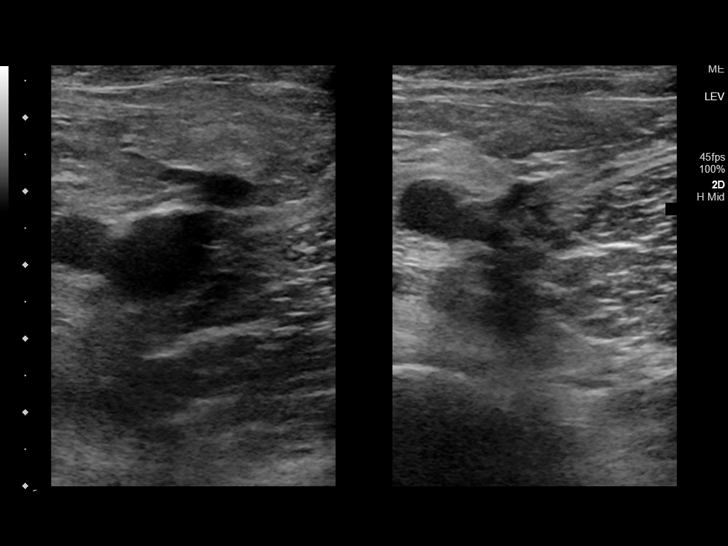
[im 10/32]
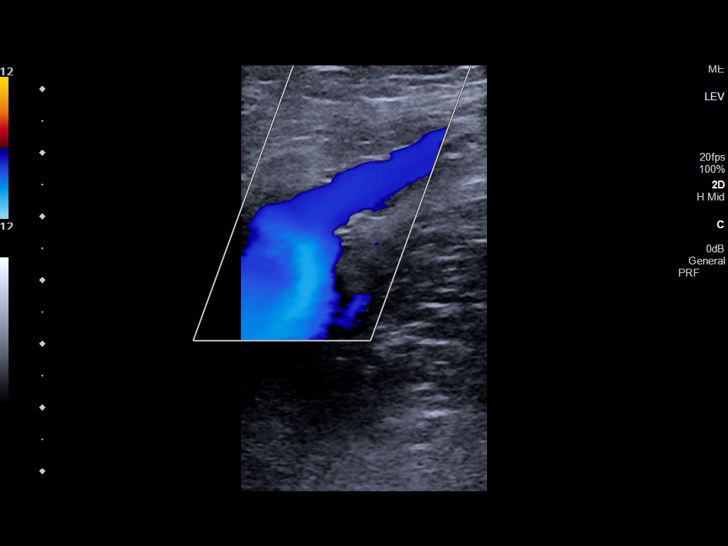
[im 13/32]
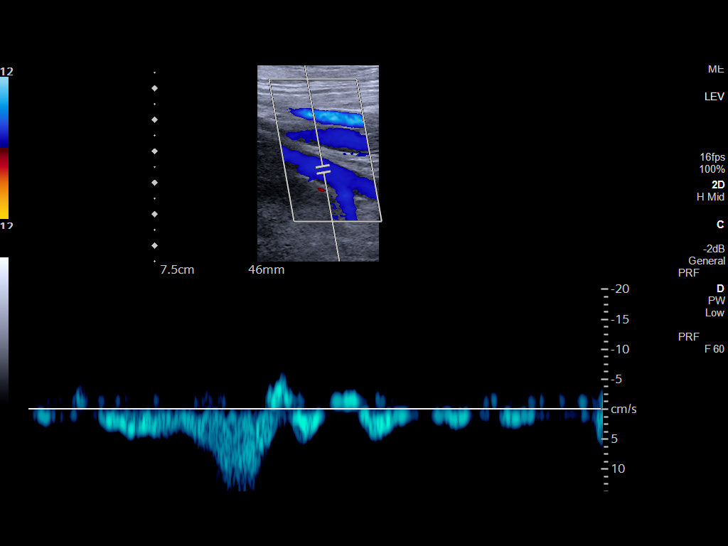
[im 15/32]
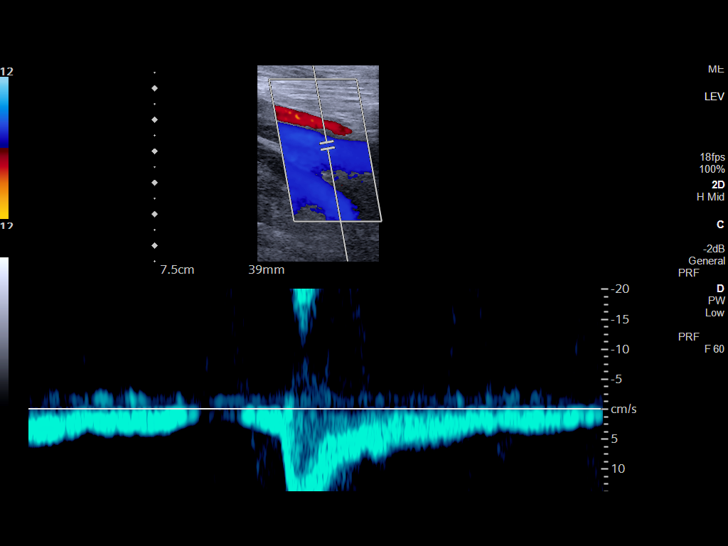
[im 17/32]
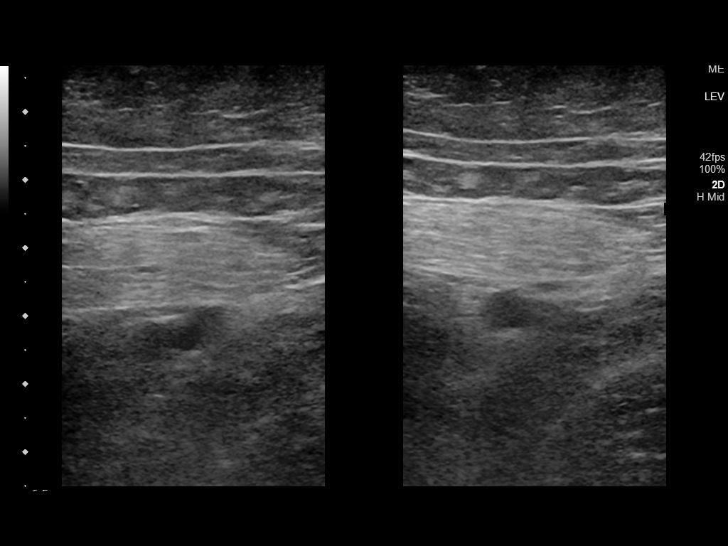
[im 19/32]
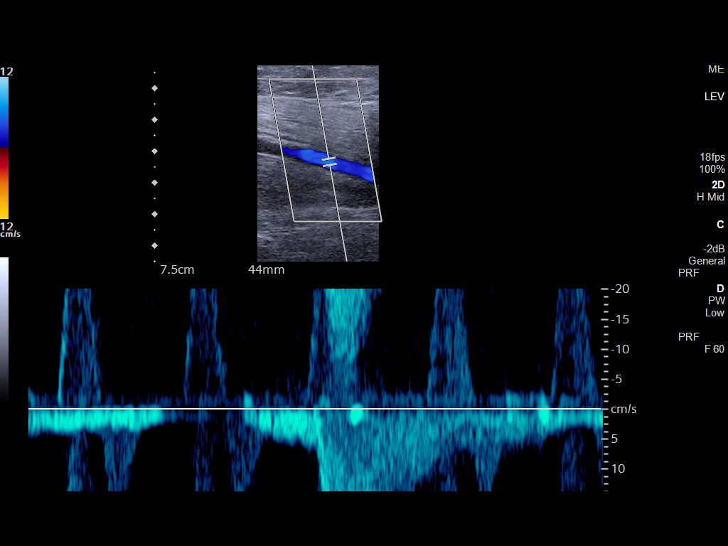
[im 22/32]
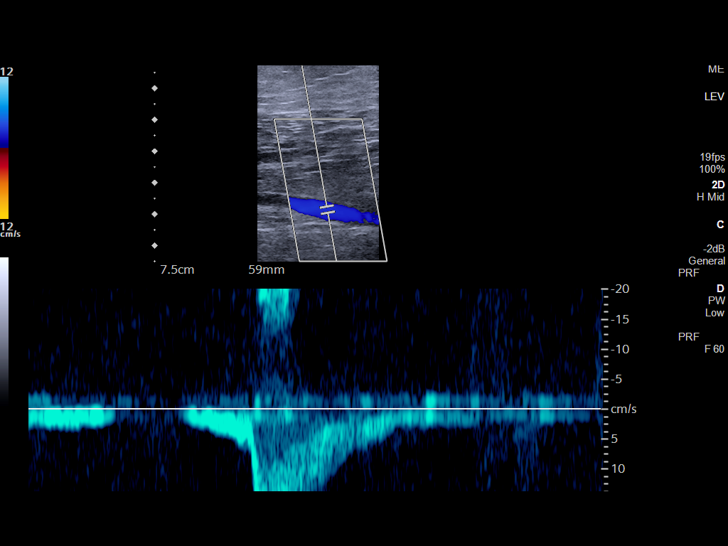
[im 25/32]
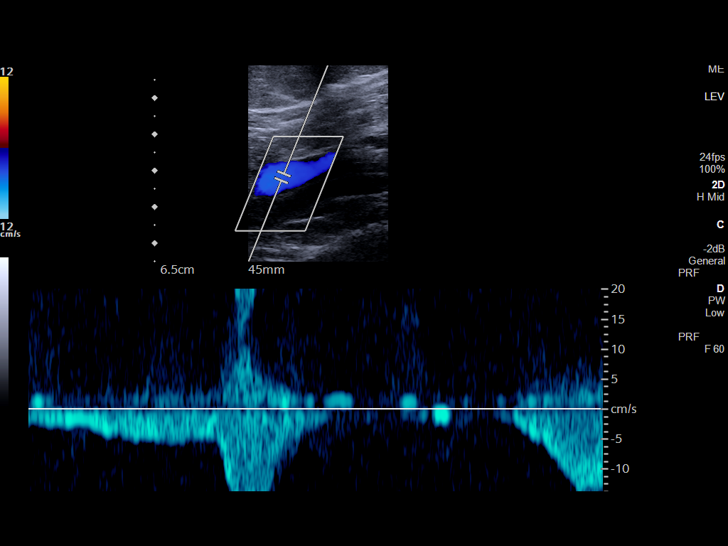
[im 26/32]
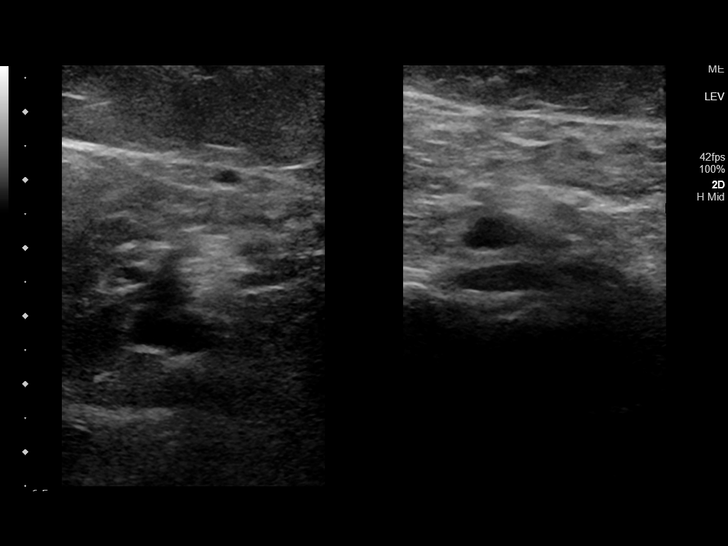
[im 29/32]
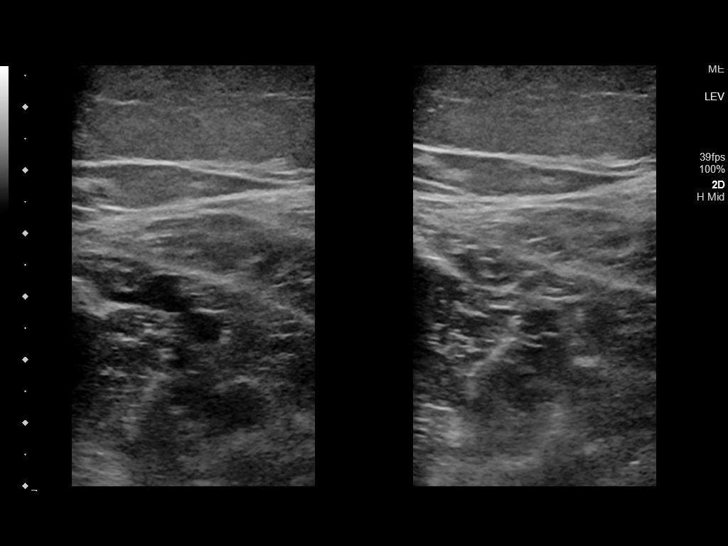
[im 32/32]
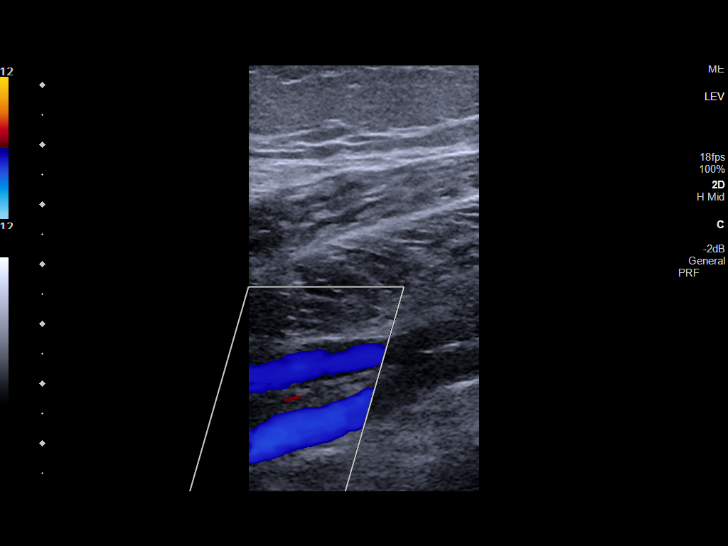

[14 of 24 positions shown; findings below may reference images not displayed]

FINDINGS: VENOUS

Normal compressibility of the common femoral, superficial femoral,
and popliteal veins, as well as the visualized calf veins.
Visualized portions of profunda femoral vein and great saphenous
vein unremarkable. No filling defects to suggest DVT on grayscale or
color Doppler imaging. Doppler waveforms show normal direction of
venous flow, normal respiratory phasicity and response to
augmentation.

Limited views of the contralateral common femoral vein are
unremarkable.

OTHER

None.

Limitations: none
IMPRESSION: No femoropopliteal DVT nor evidence of DVT within the visualized
calf veins.

If clinical symptoms are inconsistent or if there are persistent or
worsening symptoms, further imaging (possibly involving the iliac
veins) may be warranted.

## 2021-10-09 ENCOUNTER — Other Ambulatory Visit: Payer: Self-pay

## 2021-10-09 ENCOUNTER — Ambulatory Visit
Admission: EM | Admit: 2021-10-09 | Discharge: 2021-10-09 | Disposition: A | Discharge status: Home / Self Care | Payer: Medicare HMO | Attending: Emergency Medicine | Admitting: Emergency Medicine

## 2021-10-09 DIAGNOSIS — J22 Unspecified acute lower respiratory infection: Secondary | ICD-10-CM | POA: Diagnosis not present

## 2021-10-09 MED ORDER — BENZONATATE 100 MG PO CAPS: 100.0000 mg | ORAL_CAPSULE | Freq: Three times a day (TID) | ORAL | 0 refills | Status: AC

## 2021-10-09 MED ORDER — PREDNISONE 10 MG PO TABS: ORAL_TABLET | ORAL | 0 refills | Status: AC

## 2021-10-09 MED ORDER — DOXYCYCLINE HYCLATE 100 MG PO CAPS: 100.0000 mg | ORAL_CAPSULE | Freq: Two times a day (BID) | ORAL | 0 refills | Status: AC

## 2021-10-09 NOTE — Discharge Instructions (Addendum)
Take doxycycline and prednisone as prescribed.  Rest, push lots of fluids (especially water), and utilize supportive care for symptoms. You may take take acetaminophen (Tylenol) every 4-6 hours or ibuprofen every 6-8 hours for muscle pain, joint pain, headaches. Mucinex (guaifenesin) may be taken over the counter for cough as needed and can loosen phlegm. Please read the instructions and take as directed. Saline nasal sprays to rinse congestion can help as well. Warm tea with lemon and honey can sooth sore throat and cough, as can cough drops.  Take Tessalon Perles with Mucinex for cough.   Return to clinic for new-onset fever, difficulty breathing, chest pain, symptoms lasting >3 to 4 weeks, or bloody sputum.

## 2021-10-09 NOTE — ED Provider Notes (Signed)
CHIEF COMPLAINT:   Chief Complaint  Patient presents with   Cough   Wheezing     SUBJECTIVE/HPI:   Cough Associated symptoms: wheezing   Wheezing Associated symptoms: cough   A very pleasant 75 y.o.Female presents today with cough and wheezing for the last 1 week.  Patient reports testing positive for COVID-19 in August.  Patient has been treating her symptoms with Mucinex and albuterol which has helped some. Patient does not report any shortness of breath, chest pain, palpitations, visual changes, weakness, tingling, headache, nausea, vomiting, diarrhea, fever, chills.   has a past medical history of Anxiety, Asthma, Back pain, Constipation, COPD (chronic obstructive pulmonary disease) ([place]), Depression, Dyspnea, Gallbladder problem, Hyperlipidemia, Hypertension, Hypothyroid, Joint pain, Leg edema, and Prediabetes.  ROS:  Review of Systems  Respiratory:  Positive for cough and wheezing.   See Subjective/HPI Medications, Allergies and Problem List personally reviewed in Epic today OBJECTIVE:   Vitals:   10/09/21 1355  BP: 111/70  Pulse: 83  Resp: 16  Temp: 97.9 F (36.6 C)  SpO2: 96%    Physical Exam   General: Appears well-developed and well-nourished. No acute distress.  HEENT Head: Normocephalic and atraumatic. Ears: Hearing grossly intact, no drainage or visible deformity.  Nose: No nasal deviation. Mouth/Throat: No stridor or tracheal deviation.  Non erythematous posterior pharynx noted with clear drainage present.  No white patchy exudate noted. Eyes: Conjunctivae and EOM are normal. No eye drainage or scleral icterus bilaterally.  Neck: Normal range of motion, neck is supple. No cervical, tonsillar or submandibular lymph nodes palpated.   Cardiovascular: Normal rate. Regular rhythm; no murmurs, gallops, or rubs.  Pulm/Chest: No respiratory distress.  Constant forceful cough noted.  Mild rhonchi noted to bilateral lower lung fields. Neurological: Alert and  oriented to person, place, and time.  Skin: Skin is warm and dry.  No rashes, lesions, abrasions or bruising noted to skin.   Psychiatric: Normal mood, affect, behavior, and thought content.   Vital signs and nursing note reviewed.   Patient stable and cooperative with examination. PROCEDURES:    LABS/X-RAYS/EKG/MEDS:   No results found for any visits on 10/09/21.  MEDICAL DECISION MAKING:   Patient presents with cough and wheezing for the last 1 week.  Patient reports testing positive for COVID-19 in August.  Patient has been treating her symptoms with Mucinex and albuterol which has helped some. Patient does not report any shortness of breath, chest pain, palpitations, visual changes, weakness, tingling, headache, nausea, vomiting, diarrhea, fever, chills.  Given symptoms along with assessment findings, concern for lower respiratory tract infection.  Rx doxycycline and prednisone to the patient's preferred pharmacy and advised about home treatment and care to include rest, fluids, Tylenol, ibuprofen and Mucinex.  Also advised to take Ladona Ridgel as sent to her pharmacy with Mucinex to help with mucus production.  Return to clinic for any new onset fever, difficulty breathing, chest pain, symptoms lasting longer than 3 to 4 weeks or bloody sputum.  Patient verbalized understanding and agreed with treatment plan.  Patient stable upon discharge. ASSESSMENT/PLAN:  1. Lower respiratory tract infection - doxycycline (VIBRAMYCIN) 100 MG capsule; Take 1 capsule (100 mg total) by mouth 2 (two) times daily.  Dispense: 20 capsule; Refill: 0 - predniSONE (DELTASONE) 10 MG tablet; Take 3 tablets (30 mg total) by mouth daily with breakfast for 2 days, THEN 2 tablets (20 mg total) daily with breakfast for 2 days, THEN 1 tablet (10 mg total) daily with breakfast for 2 days.  Dispense: 12 tablet; Refill: 0 - benzonatate (TESSALON) 100 MG capsule; Take 1 capsule (100 mg total) by mouth every 8 (eight)  hours.  Dispense: 21 capsule; Refill: 0 Instructions about new medications and side effects provided.  Plan:   Discharge Instructions      Take doxycycline and prednisone as prescribed.  Rest, push lots of fluids (especially water), and utilize supportive care for symptoms. You may take take acetaminophen (Tylenol) every 4-6 hours or ibuprofen every 6-8 hours for muscle pain, joint pain, headaches. Mucinex (guaifenesin) may be taken over the counter for cough as needed and can loosen phlegm. Please read the instructions and take as directed. Saline nasal sprays to rinse congestion can help as well. Warm tea with lemon and honey can sooth sore throat and cough, as can cough drops.  Take Tessalon Perles with Mucinex for cough.   Return to clinic for new-onset fever, difficulty breathing, chest pain, symptoms lasting >3 to 4 weeks, or bloody sputum.          Jenny Thomas, [place] 10/09/21 1434

## 2021-10-09 NOTE — ED Triage Notes (Signed)
Patient presents to Urgent Care with complaints of cough and wheezing x 1 week. Tested positive for COVID in aug. Treating symptoms with Mucinex and albuterol with some relief.   Denies fever.

## 2021-10-22 DIAGNOSIS — G4733 Obstructive sleep apnea (adult) (pediatric): Secondary | ICD-10-CM | POA: Diagnosis not present

## 2021-10-27 ENCOUNTER — Other Ambulatory Visit: Payer: Self-pay | Admitting: Oncology

## 2021-11-03 DIAGNOSIS — K219 Gastro-esophageal reflux disease without esophagitis: Secondary | ICD-10-CM | POA: Diagnosis not present

## 2021-11-03 DIAGNOSIS — Z7901 Long term (current) use of anticoagulants: Secondary | ICD-10-CM | POA: Diagnosis not present

## 2021-11-03 DIAGNOSIS — R1319 Other dysphagia: Secondary | ICD-10-CM | POA: Diagnosis not present

## 2021-11-03 DIAGNOSIS — R195 Other fecal abnormalities: Secondary | ICD-10-CM | POA: Diagnosis not present

## 2021-11-03 DIAGNOSIS — K625 Hemorrhage of anus and rectum: Secondary | ICD-10-CM | POA: Diagnosis not present

## 2021-11-03 DIAGNOSIS — Z9884 Bariatric surgery status: Secondary | ICD-10-CM | POA: Diagnosis not present

## 2021-11-03 DIAGNOSIS — K222 Esophageal obstruction: Secondary | ICD-10-CM | POA: Diagnosis not present

## 2021-11-18 ENCOUNTER — Telehealth: Payer: Self-pay | Admitting: Oncology

## 2021-11-18 NOTE — Telephone Encounter (Signed)
Pt called to cancel her appt for 1-25. Please give her a call back at [phone]

## 2021-11-18 NOTE — Telephone Encounter (Signed)
I reached out to pt about her appts. She stated the last visit she had with Dr. Tasia Catchings that she said if her PCP monitored her levels then it would be ok if the patient followed up on a as need basis. Appts for Jan have been cancelled.

## 2022-01-03 DIAGNOSIS — Z6841 Body Mass Index (BMI) 40.0 and over, adult: Secondary | ICD-10-CM | POA: Diagnosis not present

## 2022-01-03 DIAGNOSIS — Z7901 Long term (current) use of anticoagulants: Secondary | ICD-10-CM | POA: Diagnosis not present

## 2022-01-03 DIAGNOSIS — E7800 Pure hypercholesterolemia, unspecified: Secondary | ICD-10-CM | POA: Diagnosis not present

## 2022-01-03 DIAGNOSIS — E039 Hypothyroidism, unspecified: Secondary | ICD-10-CM | POA: Diagnosis not present

## 2022-01-03 DIAGNOSIS — I10 Essential (primary) hypertension: Secondary | ICD-10-CM | POA: Diagnosis not present

## 2022-01-03 DIAGNOSIS — N1831 Chronic kidney disease, stage 3a: Secondary | ICD-10-CM | POA: Diagnosis not present

## 2022-01-03 DIAGNOSIS — R7303 Prediabetes: Secondary | ICD-10-CM | POA: Diagnosis not present

## 2022-01-03 DIAGNOSIS — Z86711 Personal history of pulmonary embolism: Secondary | ICD-10-CM | POA: Diagnosis not present

## 2022-01-05 ENCOUNTER — Ambulatory Visit: Payer: Medicare HMO | Admitting: Oncology

## 2022-01-05 ENCOUNTER — Other Ambulatory Visit: Payer: Medicare HMO

## 2022-01-10 DIAGNOSIS — R7303 Prediabetes: Secondary | ICD-10-CM | POA: Diagnosis not present

## 2022-01-10 DIAGNOSIS — J42 Unspecified chronic bronchitis: Secondary | ICD-10-CM | POA: Diagnosis not present

## 2022-01-10 DIAGNOSIS — E7800 Pure hypercholesterolemia, unspecified: Secondary | ICD-10-CM | POA: Diagnosis not present

## 2022-01-10 DIAGNOSIS — F3340 Major depressive disorder, recurrent, in remission, unspecified: Secondary | ICD-10-CM | POA: Diagnosis not present

## 2022-01-10 DIAGNOSIS — Z86711 Personal history of pulmonary embolism: Secondary | ICD-10-CM | POA: Diagnosis not present

## 2022-01-10 DIAGNOSIS — N1831 Chronic kidney disease, stage 3a: Secondary | ICD-10-CM | POA: Diagnosis not present

## 2022-01-10 DIAGNOSIS — I129 Hypertensive chronic kidney disease with stage 1 through stage 4 chronic kidney disease, or unspecified chronic kidney disease: Secondary | ICD-10-CM | POA: Diagnosis not present

## 2022-01-10 DIAGNOSIS — E039 Hypothyroidism, unspecified: Secondary | ICD-10-CM | POA: Diagnosis not present

## 2022-02-01 ENCOUNTER — Encounter: Payer: Self-pay | Admitting: Internal Medicine

## 2022-02-02 ENCOUNTER — Encounter: Payer: Self-pay | Admitting: Internal Medicine

## 2022-02-02 ENCOUNTER — Ambulatory Visit: Payer: Medicare HMO | Admitting: Certified Registered"

## 2022-02-02 ENCOUNTER — Encounter
Admission: RE | Disposition: A | Discharge status: Home / Self Care | Payer: Self-pay | Source: Home / Self Care | Attending: Internal Medicine

## 2022-02-02 ENCOUNTER — Ambulatory Visit
Admission: RE | Admit: 2022-02-02 | Discharge: 2022-02-02 | Disposition: A | Discharge status: Home / Self Care | Payer: Medicare HMO | Attending: Internal Medicine | Admitting: Internal Medicine

## 2022-02-02 DIAGNOSIS — E039 Hypothyroidism, unspecified: Secondary | ICD-10-CM | POA: Diagnosis not present

## 2022-02-02 DIAGNOSIS — R195 Other fecal abnormalities: Secondary | ICD-10-CM | POA: Insufficient documentation

## 2022-02-02 DIAGNOSIS — E785 Hyperlipidemia, unspecified: Secondary | ICD-10-CM | POA: Diagnosis not present

## 2022-02-02 DIAGNOSIS — K5730 Diverticulosis of large intestine without perforation or abscess without bleeding: Secondary | ICD-10-CM | POA: Diagnosis not present

## 2022-02-02 DIAGNOSIS — I10 Essential (primary) hypertension: Secondary | ICD-10-CM | POA: Insufficient documentation

## 2022-02-02 DIAGNOSIS — K222 Esophageal obstruction: Secondary | ICD-10-CM | POA: Diagnosis not present

## 2022-02-02 DIAGNOSIS — R1314 Dysphagia, pharyngoesophageal phase: Secondary | ICD-10-CM | POA: Insufficient documentation

## 2022-02-02 DIAGNOSIS — Z9889 Other specified postprocedural states: Secondary | ICD-10-CM | POA: Diagnosis not present

## 2022-02-02 DIAGNOSIS — R933 Abnormal findings on diagnostic imaging of other parts of digestive tract: Secondary | ICD-10-CM | POA: Insufficient documentation

## 2022-02-02 DIAGNOSIS — F418 Other specified anxiety disorders: Secondary | ICD-10-CM | POA: Insufficient documentation

## 2022-02-02 DIAGNOSIS — R042 Hemoptysis: Secondary | ICD-10-CM | POA: Diagnosis not present

## 2022-02-02 DIAGNOSIS — R058 Other specified cough: Secondary | ICD-10-CM | POA: Diagnosis not present

## 2022-02-02 DIAGNOSIS — K5790 Diverticulosis of intestine, part unspecified, without perforation or abscess without bleeding: Secondary | ICD-10-CM | POA: Diagnosis not present

## 2022-02-02 DIAGNOSIS — K635 Polyp of colon: Secondary | ICD-10-CM | POA: Diagnosis not present

## 2022-02-02 DIAGNOSIS — D175 Benign lipomatous neoplasm of intra-abdominal organs: Secondary | ICD-10-CM | POA: Diagnosis not present

## 2022-02-02 DIAGNOSIS — K640 First degree hemorrhoids: Secondary | ICD-10-CM | POA: Insufficient documentation

## 2022-02-02 DIAGNOSIS — K209 Esophagitis, unspecified without bleeding: Secondary | ICD-10-CM | POA: Diagnosis not present

## 2022-02-02 DIAGNOSIS — K2100 Gastro-esophageal reflux disease with esophagitis, without bleeding: Secondary | ICD-10-CM | POA: Diagnosis not present

## 2022-02-02 DIAGNOSIS — J449 Chronic obstructive pulmonary disease, unspecified: Secondary | ICD-10-CM | POA: Diagnosis not present

## 2022-02-02 DIAGNOSIS — D122 Benign neoplasm of ascending colon: Secondary | ICD-10-CM | POA: Diagnosis not present

## 2022-02-02 DIAGNOSIS — D124 Benign neoplasm of descending colon: Secondary | ICD-10-CM | POA: Insufficient documentation

## 2022-02-02 DIAGNOSIS — K2970 Gastritis, unspecified, without bleeding: Secondary | ICD-10-CM | POA: Diagnosis not present

## 2022-02-02 DIAGNOSIS — Z1211 Encounter for screening for malignant neoplasm of colon: Secondary | ICD-10-CM | POA: Diagnosis not present

## 2022-02-02 HISTORY — DX: Sleep apnea, unspecified: G47.30

## 2022-02-02 HISTORY — PX: COLONOSCOPY WITH PROPOFOL: SHX5780

## 2022-02-02 HISTORY — PX: ESOPHAGOGASTRODUODENOSCOPY (EGD) WITH PROPOFOL: SHX5813

## 2022-02-02 SURGERY — COLONOSCOPY WITH PROPOFOL
Anesthesia: General

## 2022-02-02 MED ORDER — GLYCOPYRROLATE 0.2 MG/ML IJ SOLN
INTRAMUSCULAR | Status: DC | PRN
  Administered 2022-02-02: .2 mg via INTRAVENOUS

## 2022-02-02 MED ORDER — SODIUM CHLORIDE 0.9 % IV SOLN: INTRAVENOUS | Status: DC

## 2022-02-02 MED ORDER — LIDOCAINE HCL (CARDIAC) PF 100 MG/5ML IV SOSY
PREFILLED_SYRINGE | INTRAVENOUS | Status: DC | PRN
  Administered 2022-02-02: 100 mg via INTRAVENOUS

## 2022-02-02 MED ORDER — OXYMETAZOLINE HCL 0.05 % NA SOLN
NASAL | Status: DC | PRN
  Administered 2022-02-02: 2 via NASAL

## 2022-02-02 MED ORDER — OXYMETAZOLINE HCL 0.05 % NA SOLN
NASAL | Status: AC
  Filled 2022-02-02: qty 30

## 2022-02-02 MED ORDER — PROPOFOL 10 MG/ML IV BOLUS
INTRAVENOUS | Status: DC | PRN
  Administered 2022-02-02 (×2): 20 mg via INTRAVENOUS
  Administered 2022-02-02: 60 mg via INTRAVENOUS

## 2022-02-02 MED ORDER — PROPOFOL 500 MG/50ML IV EMUL
INTRAVENOUS | Status: DC | PRN
  Administered 2022-02-02: 145 ug/kg/min via INTRAVENOUS

## 2022-02-02 MED ORDER — ALBUTEROL SULFATE HFA 108 (90 BASE) MCG/ACT IN AERS
INHALATION_SPRAY | RESPIRATORY_TRACT | Status: DC | PRN
  Administered 2022-02-02 (×2): 2 via RESPIRATORY_TRACT

## 2022-02-02 NOTE — Op Note (Signed)
[facility]astroenterology Patient Name: Dot Splinter Procedure Date: 02/02/2022 9:26 AM MRN: [id number] Account #: [id number] Date of Birth: 28-Dec-1945 Admit Type: Outpatient Age: 76 Room: [facility] ENDO ROOM 2 Gender: Female Note Status: Finalized Instrument Name: Altamese Cabal Endoscope [id number] Procedure:             Upper GI endoscopy Indications:           Esophageal dysphagia, Gastro-esophageal reflux                         disease, Abnormal UGI series Providers:             [name]. Alice Reichert MD, MD Referring MD:          [name] (Referring MD) Medicines:             Propofol per Anesthesia Complications:         No immediate complications. Estimated blood loss:                         Minimal. Procedure:             Pre-Anesthesia Assessment:                        - The risks and benefits of the procedure and the                         sedation options and risks were discussed with the                         patient. All questions were answered and informed                         consent was obtained.                        - Patient identification and proposed procedure were                         verified prior to the procedure by the nurse. The                         procedure was verified in the procedure room.                        - ASA Grade Assessment: III - A patient with severe                         systemic disease.                        - After reviewing the risks and benefits, the patient                         was deemed in satisfactory condition to undergo the                         procedure.                        After obtaining informed consent, the endoscope  was                         passed under direct vision. Throughout the procedure,                         the patient's blood pressure, pulse, and oxygen                         saturations were monitored continuously. The Endoscope                         was  introduced through the mouth, and advanced to the                         third part of duodenum. The upper GI endoscopy was                         accomplished without difficulty. The patient tolerated                         the procedure well. Findings:      LA Grade B (one or more mucosal breaks greater than 5 mm, not extending       between the tops of two mucosal folds) esophagitis with no bleeding was       found in the distal esophagus. Biopsies were taken with a cold forceps       for histology.      A moderate-sized area of extrinsic compression was found in the distal       esophagus.      Evidence of a fundoplication was found in the cardia. The wrap appeared       intact. This was traversed. The scope was withdrawn. Dilation was       performed with a Maloney dilator with moderate resistance at 54 Fr.      No other significant abnormalities were identified in a careful       examination of the stomach.      The examined duodenum was normal.      The exam was otherwise without abnormality. Impression:            - LA Grade B reflux esophagitis with no bleeding.                         Biopsied.                        - Extrinsic compression in the distal esophagus.                        - A fundoplication was found. The wrap appears intact.                         Dilated.                        - Normal examined duodenum.                        - The examination was otherwise normal. Recommendation:        - Await pathology results.                        -  Monitor results to esophageal dilation                        - Proceed with colonoscopy Procedure Code(s):     --- Professional ---                        [phone], Esophagogastroduodenoscopy, flexible,                         transoral; with dilation of gastric/duodenal                         stricture(s) (eg, balloon, bougie)                        43239, 29, Esophagogastroduodenoscopy, flexible,                          transoral; with biopsy, single or multiple Diagnosis Code(s):     --- Professional ---                        R93.3, Abnormal findings on diagnostic imaging of                         other parts of digestive tract                        R13.14, Dysphagia, pharyngoesophageal phase                        Z98.890, Other specified postprocedural states                        K22.2, Esophageal obstruction                        K21.00, Gastro-esophageal reflux disease with                         esophagitis, without bleeding CPT copyright 2019 American Medical Association. All rights reserved. The codes documented in this report are preliminary and upon coder review may  be revised to meet current compliance requirements. Efrain Sella MD, MD 02/02/2022 9:53:36 AM This report has been signed electronically. Number of Addenda: 0 Note Initiated On: 02/02/2022 9:26 AM Estimated Blood Loss:  Estimated blood loss was minimal.      [facility]

## 2022-02-02 NOTE — Anesthesia Procedure Notes (Addendum)
Procedure Name: General with mask airway Date/Time: 02/02/2022 9:45 AM Performed by: Kelton Pillar, CRNA Pre-anesthesia Checklist: Patient identified, Emergency Drugs available, Suction available and Patient being monitored Patient Re-evaluated:Patient Re-evaluated prior to induction Oxygen Delivery Method: Simple face mask Induction Type: IV induction Ventilation: Nasal airway inserted- appropriate to patient size Placement Confirmation: positive ETCO2 and CO2 detector Dental Injury: Teeth and Oropharynx as per pre-operative assessment

## 2022-02-02 NOTE — H&P (Signed)
Outpatient short stay form Pre-procedure 02/02/2022 9:37 AM Jenny Thomas Jenny Thomas, M.D.  Primary Physician: Jenny Thomas, M.D.  Reason for visit:  Intermittent esophageal dysphagia, GERD, hx of esophageal stricture, Positive FIT test.  History of present illness:  As above. Patient has intermittent solid food dysphagia with regurgitation and "spitting up" of food. No hemetemesis, weight loss. Previous xray suggested esophageal stricture. No previous EGD. Patient presents for colonoscopy for finding of a hemoccult positive stool. Patient denies change in bowel habits, rectal bleeding, weight loss or abdominal pain.    Current Facility-Administered Medications:    0.9 %  sodium chloride infusion, , Intravenous, Continuous, [place], [name], MD, Last Rate: 20 mL/hr at 02/02/22 6629, New Bag at 02/02/22 4765  Medications Prior to Admission  Medication Sig Dispense Refill Last Dose   busPIRone (BUSPAR) 5 MG tablet Take 5 mg by mouth 2 (two) times daily.    02/01/2022   citalopram (CELEXA) 40 MG tablet Take 40 mg by mouth daily.   02/01/2022   montelukast (SINGULAIR) 10 MG tablet Take 10 mg by mouth at bedtime.   02/01/2022   simvastatin (ZOCOR) 20 MG tablet Take 20 mg by mouth at bedtime.    02/01/2022   ACCU-CHEK FASTCLIX LANCETS MISC 1 each by Does not apply route 2 (two) times daily. 100 each 0    albuterol (PROVENTIL HFA;VENTOLIN HFA) 108 (90 Base) MCG/ACT inhaler Inhale into the lungs every 6 (six) hours as needed for wheezing or shortness of breath.      albuterol (PROVENTIL) (2.5 MG/3ML) 0.083% nebulizer solution Take 2.5 mg by nebulization every 6 (six) hours as needed for wheezing or shortness of breath.      alendronate (FOSAMAX) 70 MG tablet Take 70 mg by mouth once a week. Take with a full glass of water on an empty stomach. (Patient not taking: No sig reported)      benzonatate (TESSALON) 100 MG capsule Take 1 capsule (100 mg total) by mouth every 8 (eight) hours. 21 capsule 0    Blood  Glucose Monitoring Suppl (ACCU-CHEK NANO SMARTVIEW) w/Device KIT 1 each by Does not apply route daily. Check blood sugar twice a day. 1 kit 0    Calcium Carbonate-Vitamin D 600-400 MG-UNIT tablet Take by mouth.      cetirizine (ZYRTEC) 10 MG tablet Take 10 mg by mouth daily.      chlorhexidine (PERIDEX) 0.12 % solution  (Patient not taking: No sig reported)      cholecalciferol (VITAMIN D3) 25 MCG (1000 UNIT) tablet Take 1,000 Units by mouth daily.      doxycycline (VIBRAMYCIN) 100 MG capsule Take 1 capsule (100 mg total) by mouth 2 (two) times daily. 20 capsule 0    ELIQUIS 2.5 MG TABS tablet TAKE 1 TABLET TWICE DAILY 180 tablet 1 01/28/2022   ferrous sulfate 325 (65 FE) MG EC tablet Take 1 tablet (325 mg total) by mouth daily. (Patient taking differently: Take 325 mg by mouth daily. OTC) 30 tablet 3    Fluticasone-Salmeterol (ADVAIR) 250-50 MCG/DOSE AEPB Inhale 1 puff into the lungs 2 (two) times daily.      furosemide (LASIX) 20 MG tablet Take 20 mg by mouth daily as needed.      levothyroxine (SYNTHROID, LEVOTHROID) 88 MCG tablet Take 88 mcg by mouth daily before breakfast.      methocarbamol (ROBAXIN) 500 MG tablet Take 1 tablet (500 mg total) by mouth 4 (four) times daily. (Patient not taking: No sig reported) 16 tablet 0  Multiple Vitamin (MULTI-VITAMIN) tablet Take by mouth.      pantoprazole (PROTONIX) 20 MG tablet Take 1 tablet by mouth daily.      potassium chloride SA (KLOR-CON) 20 MEQ tablet 20 mEq daily as needed. Used along with lasix . PRN      Vitamin D, Ergocalciferol, (DRISDOL) 1.25 MG (50000 UT) CAPS capsule Take 1 capsule (50,000 Units total) by mouth every 7 (seven) days. (Patient not taking: Reported on 07/05/2021) 4 capsule 0      Allergies  Allergen Reactions   Bee Pollen Anaphylaxis    Bee stings & bee pollen- anaphylaxis   Percocet [Oxycodone-Acetaminophen] Nausea And Vomiting     Past Medical History:  Diagnosis Date   Anxiety    Asthma    Back pain     Constipation    COPD (chronic obstructive pulmonary disease) ([facility])    Depression    Dyspnea    Gallbladder problem    Hyperlipidemia    Hypertension    Hypothyroid    Joint pain    Leg edema    Prediabetes    Sleep apnea     Review of systems:  Otherwise negative.    Physical Exam  Gen: Alert, oriented. Appears stated age.  HEENT: [place]/AT. PERRLA. Lungs: CTA, no wheezes. CV: RR nl S1, S2. Abd: soft, benign, no masses. BS+ Ext: No edema. Pulses 2+    Planned procedures: Proceed with EGD and colonoscopy. The patient understands the nature of the planned procedure, indications, risks, alternatives and potential complications including but not limited to bleeding, infection, perforation, damage to internal organs and possible oversedation/side effects from anesthesia. The patient agrees and gives consent to proceed.  Please refer to procedure notes for findings, recommendations and patient disposition/instructions.     [name] Jenny Thomas, M.D. Gastroenterology 02/02/2022  9:37 AM

## 2022-02-02 NOTE — Transfer of Care (Signed)
Immediate Anesthesia Transfer of Care Note  Patient: Jenny Thomas  Procedure(s) Performed: COLONOSCOPY WITH PROPOFOL ESOPHAGOGASTRODUODENOSCOPY (EGD) WITH PROPOFOL  Patient Location: Endoscopy Unit  Anesthesia Type:General  Level of Consciousness: awake, alert  and patient cooperative  Airway & Oxygen Therapy: Patient Spontanous Breathing and Patient connected to face mask oxygen  Post-op Assessment: Report given to RN and Post -op Vital signs reviewed and stable  Post vital signs: Reviewed and stable  Last Vitals:  Vitals Value Taken Time  BP 116/69 02/02/22 1027  Temp    Pulse 98 02/02/22 1027  Resp 21 02/02/22 1027  SpO2 100 % 02/02/22 1027  Vitals shown include unvalidated device data.  Last Pain:  Vitals:   02/02/22 1018  TempSrc:   PainSc: Asleep         Complications: No notable events documented.

## 2022-02-02 NOTE — Anesthesia Postprocedure Evaluation (Signed)
Anesthesia Post Note  Patient: Jenny Thomas  Procedure(s) Performed: COLONOSCOPY WITH PROPOFOL ESOPHAGOGASTRODUODENOSCOPY (EGD) WITH PROPOFOL  Patient location during evaluation: Endoscopy Anesthesia Type: General Level of consciousness: awake and alert Pain management: pain level controlled Vital Signs Assessment: post-procedure vital signs reviewed and stable Respiratory status: spontaneous breathing, nonlabored ventilation and respiratory function stable Cardiovascular status: blood pressure returned to baseline and stable Postop Assessment: no apparent nausea or vomiting Anesthetic complications: no   No notable events documented.   Last Vitals:  Vitals:   02/02/22 1018 02/02/22 1028  BP: 103/64 116/69  Pulse: (!) 108 (!) 104  Resp: (!) 21 20  Temp:    SpO2: 100% 100%    Last Pain:  Vitals:   02/02/22 1028  TempSrc:   PainSc: 0-No pain                 [place] Ouch

## 2022-02-02 NOTE — Interval H&P Note (Signed)
History and Physical Interval Note:  02/02/2022 9:39 AM  Jenny Thomas  has presented today for surgery, with the diagnosis of +Fit Esophageal Dysphagia Esophageal stricture GERD.  The various methods of treatment have been discussed with the patient and family. After consideration of risks, benefits and other options for treatment, the patient has consented to  Procedure(s): COLONOSCOPY WITH PROPOFOL (N/A) ESOPHAGOGASTRODUODENOSCOPY (EGD) WITH PROPOFOL (N/A) as a surgical intervention.  The patient's history has been reviewed, patient examined, no change in status, stable for surgery.  I have reviewed the patient's chart and labs.  Questions were answered to the patient's satisfaction.     [place], [place]

## 2022-02-02 NOTE — Anesthesia Preprocedure Evaluation (Signed)
Anesthesia Evaluation  Patient identified by MRN, date of birth, ID band Patient awake    Reviewed: Allergy & Precautions, NPO status , Patient's Chart, lab work & pertinent test results  Airway Mallampati: III  TM Distance: >3 FB Neck ROM: full    Dental no notable dental hx.    Pulmonary shortness of breath and with exertion, sleep apnea , COPD,    Pulmonary exam normal        Cardiovascular hypertension, Normal cardiovascular exam     Neuro/Psych PSYCHIATRIC DISORDERS Anxiety negative neurological ROS     GI/Hepatic negative GI ROS, Neg liver ROS,   Endo/Other  diabetesHypothyroidism Morbid obesity  Renal/GU negative Renal ROS  negative genitourinary   Musculoskeletal   Abdominal   Peds  Hematology negative hematology ROS (+)   Anesthesia Other Findings Past Medical History: No date: Anxiety No date: Asthma No date: Back pain No date: Constipation No date: COPD (chronic obstructive pulmonary disease) ([facility]) No date: Depression No date: Dyspnea No date: Gallbladder problem No date: Hyperlipidemia No date: Hypertension No date: Hypothyroid No date: Joint pain No date: Leg edema No date: Prediabetes No date: Sleep apnea  Past Surgical History: No date: ABDOMINAL HYSTERECTOMY No date: CHOLECYSTECTOMY No date: hernia repair No date: LAPAROSCOPIC GASTRIC BANDING  BMI    Body Mass Index: 50.79 kg/m      Reproductive/Obstetrics negative OB ROS                             Anesthesia Physical Anesthesia Plan  ASA: 3  Anesthesia Plan: General   Post-op Pain Management:    Induction:   PONV Risk Score and Plan: Propofol infusion and TIVA  Airway Management Planned:   Additional Equipment:   Intra-op Plan:   Post-operative Plan:   Informed Consent: I have reviewed the patients History and Physical, chart, labs and discussed the procedure including the risks,  benefits and alternatives for the proposed anesthesia with the patient or authorized representative who has indicated his/her understanding and acceptance.     Dental Advisory Given  Plan Discussed with: Anesthesiologist, CRNA and Surgeon  Anesthesia Plan Comments:         Anesthesia Quick Evaluation

## 2022-02-02 NOTE — Op Note (Signed)
[facility] Gastroenterology Patient Name: Jenny Thomas Procedure Date: 02/02/2022 9:24 AM MRN: [id number] Account #: [id number] Date of Birth: 08/15/1946 Admit Type: Outpatient Age: 76 Room: [facility] ENDO ROOM 2 Gender: Female Note Status: Finalized Instrument Name: Colonoscope [id number] Procedure:             Colonoscopy Indications:           Positive fecal immunochemical test Providers:             Lorie Apley K. Alice Reichert MD, MD Referring MD:          [name] (Referring MD) Medicines:             Propofol per Anesthesia Complications:         Productive cough occurred with some hemoptysis after                         esophageal dilation. No decrease in oxygen saturations                         but patient was administered albuterol treatments by                         anesthesia and they also performed frequent suctioning                         to remove secretions. Procedure:             Pre-Anesthesia Assessment:                        - The risks and benefits of the procedure and the                         sedation options and risks were discussed with the                         patient. All questions were answered and informed                         consent was obtained.                        - Patient identification and proposed procedure were                         verified prior to the procedure by the nurse. The                         procedure was verified in the procedure room.                        - ASA Grade Assessment: III - A patient with severe                         systemic disease.                        - After reviewing the risks and benefits, the patient  was deemed in satisfactory condition to undergo the                         procedure.                        After obtaining informed consent, the colonoscope was                         passed under direct vision. Throughout the procedure,                          the patient's blood pressure, pulse, and oxygen                         saturations were monitored continuously. The                         Colonoscope was introduced through the anus and                         advanced to the the cecum, identified by appendiceal                         orifice and ileocecal valve. The colonoscopy was                         performed without difficulty. The quality of the bowel                         preparation was adequate. The patient tolerated the                         procedure. Findings:      The perianal and digital rectal examinations were normal. Pertinent       negatives include normal sphincter tone and no palpable rectal lesions.      Non-bleeding internal hemorrhoids were found during retroflexion. The       hemorrhoids were Grade I (internal hemorrhoids that do not prolapse).      Many medium-mouthed diverticula were found in the sigmoid colon.      A 10 mm polyp was found in the descending colon. The polyp was       semi-pedunculated. The polyp was removed with a hot snare. Resection and       retrieval were complete.      A 8 mm polyp was found in the ascending colon. The polyp was sessile.       The polyp was removed with a hot snare. Resection and retrieval were       complete.      There was a small lipoma, 15 mm in diameter, in the distal transverse       colon. Biopsies were taken with a cold forceps for histology.      The exam was otherwise without abnormality. Impression:            - Non-bleeding internal hemorrhoids.                        - Diverticulosis in the sigmoid colon.                        -  One 10 mm polyp in the descending colon, removed                         with a hot snare. Resected and retrieved.                        - One 8 mm polyp in the ascending colon, removed with                         a hot snare. Resected and retrieved.                        - Small lipoma in the distal  transverse colon.                         Biopsied.                        - The examination was otherwise normal. Recommendation:        - Await pathology results from EGD, also performed                         today.                        - Monitor results to esophageal dilation                        - Patient has a contact number available for                         emergencies. The signs and symptoms of potential                         delayed complications were discussed with the patient.                         Return to normal activities tomorrow. Written                         discharge instructions were provided to the patient.                        - Resume previous diet.                        - Continue present medications.                        - Repeat colonoscopy is recommended for surveillance.                         The colonoscopy date will be determined after                         pathology results from today's exam become available                         for review.                        -  Return to GI office in 4 weeks.                        - The findings and recommendations were discussed with                         the patient. Procedure Code(s):     --- Professional ---                        [phone], Colonoscopy, flexible; with removal of                         tumor(s), polyp(s), or other lesion(s) by snare                         technique                        45380, 54, Colonoscopy, flexible; with biopsy, single                         or multiple Diagnosis Code(s):     --- Professional ---                        K57.30, Diverticulosis of large intestine without                         perforation or abscess without bleeding                        R19.5, Other fecal abnormalities                        D17.5, Benign lipomatous neoplasm of intra-abdominal                         organs                        K63.5, Polyp of colon                         K64.0, First degree hemorrhoids CPT copyright 2019 [employer]. All rights reserved. The codes documented in this report are preliminary and upon coder review may  be revised to meet current compliance requirements. Efrain Sella MD, MD 02/02/2022 10:17:09 AM This report has been signed electronically. Number of Addenda: 0 Note Initiated On: 02/02/2022 9:24 AM Scope Withdrawal Time: 0 hours 5 minutes 52 seconds  Total Procedure Duration: 0 hours 9 minutes 45 seconds  Estimated Blood Loss:  Estimated blood loss: none. Estimated blood loss:                         none. Estimated blood loss: none.      [facility]

## 2022-02-03 ENCOUNTER — Encounter: Payer: Self-pay | Admitting: Internal Medicine

## 2022-02-08 LAB — SURGICAL PATHOLOGY

## 2022-03-03 DIAGNOSIS — R0602 Shortness of breath: Secondary | ICD-10-CM | POA: Diagnosis not present

## 2022-03-03 DIAGNOSIS — R0902 Hypoxemia: Secondary | ICD-10-CM | POA: Diagnosis not present

## 2022-03-03 DIAGNOSIS — J449 Chronic obstructive pulmonary disease, unspecified: Secondary | ICD-10-CM | POA: Diagnosis not present

## 2022-03-03 DIAGNOSIS — J42 Unspecified chronic bronchitis: Secondary | ICD-10-CM | POA: Diagnosis not present

## 2022-03-16 DIAGNOSIS — R0602 Shortness of breath: Secondary | ICD-10-CM | POA: Diagnosis not present

## 2022-03-24 DIAGNOSIS — J310 Chronic rhinitis: Secondary | ICD-10-CM | POA: Diagnosis not present

## 2022-03-24 DIAGNOSIS — R0602 Shortness of breath: Secondary | ICD-10-CM | POA: Diagnosis not present

## 2022-03-24 DIAGNOSIS — J4520 Mild intermittent asthma, uncomplicated: Secondary | ICD-10-CM | POA: Diagnosis not present

## 2022-03-24 DIAGNOSIS — G4733 Obstructive sleep apnea (adult) (pediatric): Secondary | ICD-10-CM | POA: Diagnosis not present

## 2022-03-24 DIAGNOSIS — Z9981 Dependence on supplemental oxygen: Secondary | ICD-10-CM | POA: Diagnosis not present

## 2022-03-29 DIAGNOSIS — K2100 Gastro-esophageal reflux disease with esophagitis, without bleeding: Secondary | ICD-10-CM | POA: Diagnosis not present

## 2022-03-29 DIAGNOSIS — Z86010 Personal history of colonic polyps: Secondary | ICD-10-CM | POA: Diagnosis not present

## 2022-03-29 DIAGNOSIS — Z9884 Bariatric surgery status: Secondary | ICD-10-CM | POA: Diagnosis not present

## 2022-03-29 DIAGNOSIS — K648 Other hemorrhoids: Secondary | ICD-10-CM | POA: Diagnosis not present

## 2022-04-03 DIAGNOSIS — J449 Chronic obstructive pulmonary disease, unspecified: Secondary | ICD-10-CM | POA: Diagnosis not present

## 2022-05-03 DIAGNOSIS — J449 Chronic obstructive pulmonary disease, unspecified: Secondary | ICD-10-CM | POA: Diagnosis not present

## 2022-05-18 DIAGNOSIS — J449 Chronic obstructive pulmonary disease, unspecified: Secondary | ICD-10-CM | POA: Diagnosis not present

## 2022-05-18 DIAGNOSIS — G4733 Obstructive sleep apnea (adult) (pediatric): Secondary | ICD-10-CM | POA: Diagnosis not present

## 2022-06-01 DIAGNOSIS — H40023 Open angle with borderline findings, high risk, bilateral: Secondary | ICD-10-CM | POA: Diagnosis not present

## 2022-06-03 DIAGNOSIS — J449 Chronic obstructive pulmonary disease, unspecified: Secondary | ICD-10-CM | POA: Diagnosis not present

## 2022-06-04 ENCOUNTER — Other Ambulatory Visit: Payer: Self-pay | Admitting: Oncology

## 2022-07-18 ENCOUNTER — Other Ambulatory Visit: Payer: Self-pay | Admitting: Family Medicine

## 2022-07-18 DIAGNOSIS — Z1231 Encounter for screening mammogram for malignant neoplasm of breast: Secondary | ICD-10-CM

## 2022-08-22 ENCOUNTER — Ambulatory Visit
Admission: RE | Admit: 2022-08-22 | Discharge: 2022-08-22 | Disposition: A | Discharge status: Home / Self Care | Payer: Medicare Other | Source: Ambulatory Visit | Attending: Family Medicine | Admitting: Family Medicine

## 2022-08-22 DIAGNOSIS — Z1231 Encounter for screening mammogram for malignant neoplasm of breast: Secondary | ICD-10-CM | POA: Insufficient documentation

## 2023-09-18 ENCOUNTER — Other Ambulatory Visit: Payer: Self-pay | Admitting: Family Medicine

## 2023-09-18 DIAGNOSIS — Z1231 Encounter for screening mammogram for malignant neoplasm of breast: Secondary | ICD-10-CM

## 2024-03-13 ENCOUNTER — Ambulatory Visit
Admission: RE | Admit: 2024-03-13 | Discharge: 2024-03-13 | Disposition: A | Discharge status: Home / Self Care | Payer: Self-pay | Source: Ambulatory Visit | Attending: Family Medicine | Admitting: Family Medicine

## 2024-03-13 DIAGNOSIS — Z1231 Encounter for screening mammogram for malignant neoplasm of breast: Secondary | ICD-10-CM | POA: Diagnosis present
